# Patient Record
Sex: Male | Born: 1935 | Race: White | Hispanic: No | Smoking: Never smoker
Health system: Southern US, Community
[De-identification: ages and names within clinical notes are randomized; demographics above are authoritative.]

## PROBLEM LIST (undated history)

## (undated) DIAGNOSIS — R0600 Dyspnea, unspecified: Secondary | ICD-10-CM

## (undated) DIAGNOSIS — E291 Testicular hypofunction: Secondary | ICD-10-CM

## (undated) DIAGNOSIS — I219 Acute myocardial infarction, unspecified: Secondary | ICD-10-CM

## (undated) DIAGNOSIS — M1990 Unspecified osteoarthritis, unspecified site: Secondary | ICD-10-CM

## (undated) DIAGNOSIS — IMO0002 Reserved for concepts with insufficient information to code with codable children: Secondary | ICD-10-CM

## (undated) DIAGNOSIS — R7989 Other specified abnormal findings of blood chemistry: Secondary | ICD-10-CM

## (undated) DIAGNOSIS — D649 Anemia, unspecified: Secondary | ICD-10-CM

## (undated) DIAGNOSIS — M545 Low back pain, unspecified: Secondary | ICD-10-CM

## (undated) DIAGNOSIS — M4800 Spinal stenosis, site unspecified: Secondary | ICD-10-CM

## (undated) DIAGNOSIS — I2510 Atherosclerotic heart disease of native coronary artery without angina pectoris: Secondary | ICD-10-CM

## (undated) DIAGNOSIS — N400 Enlarged prostate without lower urinary tract symptoms: Secondary | ICD-10-CM

## (undated) DIAGNOSIS — I10 Essential (primary) hypertension: Secondary | ICD-10-CM

## (undated) DIAGNOSIS — K8020 Calculus of gallbladder without cholecystitis without obstruction: Secondary | ICD-10-CM

## (undated) DIAGNOSIS — E785 Hyperlipidemia, unspecified: Secondary | ICD-10-CM

## (undated) HISTORY — DX: Anemia, unspecified: D64.9

## (undated) HISTORY — DX: Atherosclerotic heart disease of native coronary artery without angina pectoris: I25.10

## (undated) HISTORY — DX: Acute myocardial infarction, unspecified: I21.9

## (undated) HISTORY — DX: Reserved for concepts with insufficient information to code with codable children: IMO0002

## (undated) HISTORY — DX: Low back pain: M54.5

## (undated) HISTORY — DX: Dyspnea, unspecified: R06.00

## (undated) HISTORY — DX: Calculus of gallbladder without cholecystitis without obstruction: K80.20

## (undated) HISTORY — DX: Low back pain, unspecified: M54.50

## (undated) HISTORY — DX: Other specified abnormal findings of blood chemistry: R79.89

## (undated) HISTORY — DX: Benign prostatic hyperplasia without lower urinary tract symptoms: N40.0

## (undated) HISTORY — DX: Hyperlipidemia, unspecified: E78.5

## (undated) HISTORY — DX: Essential (primary) hypertension: I10

## (undated) HISTORY — DX: Testicular hypofunction: E29.1

## (undated) HISTORY — DX: Spinal stenosis, site unspecified: M48.00

## (undated) HISTORY — PX: TOTAL HIP ARTHROPLASTY: SHX124

## (undated) HISTORY — DX: Unspecified osteoarthritis, unspecified site: M19.90

---

## 1963-11-13 HISTORY — PX: APPENDECTOMY: SHX54

## 2000-03-26 ENCOUNTER — Encounter: Payer: Self-pay | Admitting: Urology

## 2000-03-28 ENCOUNTER — Observation Stay (HOSPITAL_COMMUNITY): Admission: RE | Admit: 2000-03-28 | Discharge: 2000-03-29 | Payer: Self-pay | Admitting: General Surgery

## 2000-06-06 ENCOUNTER — Encounter (INDEPENDENT_AMBULATORY_CARE_PROVIDER_SITE_OTHER): Payer: Self-pay | Admitting: Specialist

## 2000-06-06 ENCOUNTER — Observation Stay (HOSPITAL_COMMUNITY): Admission: RE | Admit: 2000-06-06 | Discharge: 2000-06-07 | Payer: Self-pay | Admitting: Urology

## 2000-06-24 ENCOUNTER — Encounter: Payer: Self-pay | Admitting: Emergency Medicine

## 2000-06-24 ENCOUNTER — Encounter: Payer: Self-pay | Admitting: Urology

## 2000-06-24 ENCOUNTER — Encounter: Payer: Self-pay | Admitting: General Surgery

## 2000-06-24 ENCOUNTER — Inpatient Hospital Stay (HOSPITAL_COMMUNITY): Admission: EM | Admit: 2000-06-24 | Discharge: 2000-07-09 | Payer: Self-pay

## 2000-06-26 ENCOUNTER — Encounter: Payer: Self-pay | Admitting: General Surgery

## 2000-06-27 ENCOUNTER — Encounter: Payer: Self-pay | Admitting: General Surgery

## 2000-07-02 ENCOUNTER — Encounter: Payer: Self-pay | Admitting: General Surgery

## 2003-07-06 ENCOUNTER — Emergency Department (HOSPITAL_COMMUNITY): Admission: EM | Admit: 2003-07-06 | Discharge: 2003-07-07 | Payer: Self-pay

## 2003-07-08 ENCOUNTER — Encounter: Payer: Self-pay | Admitting: Orthopedic Surgery

## 2003-07-09 ENCOUNTER — Inpatient Hospital Stay (HOSPITAL_COMMUNITY): Admission: RE | Admit: 2003-07-09 | Discharge: 2003-07-13 | Payer: Self-pay | Admitting: Orthopedic Surgery

## 2003-08-27 ENCOUNTER — Ambulatory Visit (HOSPITAL_COMMUNITY): Admission: RE | Admit: 2003-08-27 | Discharge: 2003-08-27 | Payer: Self-pay | Admitting: Orthopedic Surgery

## 2003-11-13 HISTORY — PX: TOTAL KNEE ARTHROPLASTY: SHX125

## 2005-11-12 HISTORY — PX: TOTAL KNEE ARTHROPLASTY: SHX125

## 2005-11-16 ENCOUNTER — Ambulatory Visit: Payer: Self-pay | Admitting: Internal Medicine

## 2005-11-28 ENCOUNTER — Ambulatory Visit: Payer: Self-pay | Admitting: Internal Medicine

## 2005-12-24 ENCOUNTER — Ambulatory Visit: Payer: Self-pay | Admitting: Internal Medicine

## 2005-12-27 ENCOUNTER — Ambulatory Visit: Payer: Self-pay | Admitting: Cardiology

## 2006-01-29 ENCOUNTER — Ambulatory Visit: Payer: Self-pay | Admitting: Internal Medicine

## 2006-02-08 ENCOUNTER — Ambulatory Visit: Payer: Self-pay | Admitting: Internal Medicine

## 2006-05-09 ENCOUNTER — Ambulatory Visit: Payer: Self-pay | Admitting: Internal Medicine

## 2006-05-16 ENCOUNTER — Ambulatory Visit: Payer: Self-pay | Admitting: Cardiology

## 2007-02-20 ENCOUNTER — Ambulatory Visit: Payer: Self-pay | Admitting: Internal Medicine

## 2008-02-18 DIAGNOSIS — J984 Other disorders of lung: Secondary | ICD-10-CM | POA: Insufficient documentation

## 2008-02-18 DIAGNOSIS — I517 Cardiomegaly: Secondary | ICD-10-CM | POA: Insufficient documentation

## 2008-02-18 DIAGNOSIS — R0602 Shortness of breath: Secondary | ICD-10-CM | POA: Insufficient documentation

## 2008-02-26 ENCOUNTER — Encounter: Payer: Self-pay | Admitting: Internal Medicine

## 2011-03-30 NOTE — Op Note (Signed)
[facility]  Patient:    Andrew Frey, Andrew Frey                      MRN: [id number] Proc. Date: 03/28/00 Adm. Date:  [id number] Disc. Date: [id number] Attending:  Laqueta Jean CC:         Sigmund I. Patsi Sears, M.D.                           Operative Report  PROCEDURE:  Repair recurrent left inguinal hernia with mesh--following Dr. Hollie Beach left hydrocelectomy.  SURGEON:  Gita Kudo, M.D.  ANESTHESIA:  General endotracheal.  PREOPERATIVE DIAGNOSIS:  Recurrent left inguinal hernia, left hydrocele.  POSTOPERATIVE DIAGNOSIS:  Recurrent left inguinal hernia, left hydrocele.  CLINICAL SUMMARY:  A 77 year old male requires hydrocelectomy by Dr. Patsi Sears for a large scrotal hydrocele in the left side. He has had a previous left inguinal hernia repair as well as a right inguinal hernia repair. He has a recurrence on the left that is symptomatic.  OPERATIVE FINDINGS:  There is a large hydrocele and this is described by Dr. Patsi Sears in his operative note. There was a large recurrent inguinal hernia on the left. The anatomy was distorted because of his previous surgery. It was a large combined direct/indirect.  DESCRIPTION OF PROCEDURE:  Under satisfactory general endotracheal anesthesia, having received 1.0 gm Ancef preop, the patients abdomen, scrotum and genitalia were prepped and draped in standard fashion. A transverse incision made near his previous incision on the left and carried down to the cord contents. Because of scar, the usual anatomy was difficult. The cord and its contents were mobilized with a Penrose drain and at this point, Dr. Patsi Sears brought the scrotum up and did a hydrocelectomy and described this separately. After placing a drain in the scrotum, I repaired the hernia.  The cord contents were mobilized with a Penrose drain. The large hernia was freed all around to healthy fascia and then inverted. Finger dissection  made a plane under the floor of the canal for placement of mesh. A piece of mesh was fashioned into a cone shaped plug and placed point end down into the defect. Then it was anchored with 3 sutures placed 1 cm from the fascial rim through and through the mesh and then back out through the fascia. In this manner, the defect was well plugged. Then the defect was closed over this with interrupted #0 Prolene suture and the ends left long. The remainder of the mesh was then tailored into an oval with a slit to go around the cord structures. It was anchored at the internal ring with a previously placed suture and then tacked around the periphery to the abdominal wall fascia above--remnants of the internal oblique muscle--fascia. It was anchored inferiorly to the inguinal ligament and then the tails were sutured to each other and the fascia above and lateral to the cord. The wound was then lavaged with saline, infiltrated with Marcaine. Closure with interrupted 2-0 and 3-0 Vicryl in layers and the skin edges approximated with Steri-Strips. Sterile dressings applied and the patient to the recovery room from the operating room in good condition. DD: 03/28/00 TD:  04/02/00 Job: [id number]

## 2011-03-30 NOTE — Op Note (Signed)
[facility]  Patient:    Andrew Frey, Andrew Frey                      MRN: [id number] Adm. Date:  [id number] Disc. Date: [id number] Attending:  Laqueta Jean CC:         Gita Kudo, M.D.             Dr. Nadine Counts                           Operative Report  PREOPERATIVE DIAGNOSES:  Left inguinal hernia and large left hydrocele.  POSTOPERATIVE DIAGNOSES:  Left inguinal hernia and large left hydrocele.  PROCEDURE:  Left hydrocelectomy per Dr. Patsi Sears and left inguinal herniorrhaphy per Dr. Maryagnes Amos.  HISTORY OF PRESENT ILLNESS:  After preparation and monitoring and appropriate preanesthesia, the patient was brought to the operating room and placed on the operating table in the dorsal supine position.  The patient then underwent exploratory inguinal surgery per Dr. Maryagnes Amos, with identification of the patients spermatic cord and identification of the patients large recurrent hernia.  The testicle was then delivered into the wound with blunt dissection, with a large amount of scarring noted.  A rather large hydrocele was noted, with clear straw-colored fluid, measuring approximately 100-150 cc.  This was drained, and the hydrocele sac was excised and discarded.  A 3-0 Vicryl suture was then used to suture around the edges of the hydrocele sac so that it did not bleed.  The testicle was delivered in the wound, and a drain was placed in retrograde fashion in the scrotum, with a Penrose drain, and sutured with 3-0 Vicryl.  The patient then underwent left inguinal hernia repair per Dr. Maryagnes Amos. DD:  03/28/00 TD:  04/02/00 Job: [id number]
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
## 2011-03-30 NOTE — Op Note (Signed)
[facility]  Patient:    Andrew Frey, Andrew Frey                      MRN: [id number] Proc. Date: 06/06/00 Adm. Date:  [id number] Disc. Date: [id number] Attending:  Laqueta Jean                           Operative Report  PREOPERATIVE DIAGNOSIS:  Left scrotal hematoma and concomitant tick bite.  POSTOPERATIVE DIAGNOSIS:  Left scrotal hematoma and concomitant tick bite.  OPERATION PERFORMED:  Excision of tick and scrotal exploration, drains of hematoma.  SURGEON:  Dr. Patsi Sears.  ANESTHESIA:  General.  PREPARATION:  After appropriate preanesthesia, the patient was brought to the operating room, placed on the operating table in the dorsal supine position where general anesthesia was introduced. He remained in this position where the scrotum was prepped with Betadine solution and draped in the usual fashion. Inspection of the scrotum revealed that the patient had sustained a tick bite, and the tick was still attached. The tick was removed by excising around the head of the tick. This was sent to the laboratory for evaluation. A 6 cm left hemiscrotal incision was then made and subcutaneous tissue dissected with the electrosurgical unit. Wound hematoma was identified and this was drained. This was irrigated then with copious amounts of antibiotic irrigation and packed with iodoform gauze. A sterile dressing was applied. The patient was awakened and taken to the recovery room in good condition. The wound was injected with 0.5 Marcaine with epinephrine. DD:  06/06/00 TD:  06/08/00 Job: [id number]
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
## 2011-05-03 ENCOUNTER — Other Ambulatory Visit: Payer: Self-pay | Admitting: Orthopaedic Surgery

## 2011-05-03 DIAGNOSIS — M545 Low back pain, unspecified: Secondary | ICD-10-CM

## 2011-05-10 ENCOUNTER — Other Ambulatory Visit: Payer: Self-pay

## 2011-05-24 ENCOUNTER — Ambulatory Visit
Admission: RE | Admit: 2011-05-24 | Discharge: 2011-05-24 | Disposition: A | Discharge status: Home / Self Care | Payer: Medicare Other | Source: Ambulatory Visit | Attending: Orthopaedic Surgery | Admitting: Orthopaedic Surgery

## 2011-05-24 DIAGNOSIS — M545 Low back pain, unspecified: Secondary | ICD-10-CM

## 2011-11-13 HISTORY — PX: OTHER SURGICAL HISTORY: SHX169

## 2013-05-14 ENCOUNTER — Other Ambulatory Visit: Payer: Self-pay | Admitting: *Deleted

## 2013-05-14 ENCOUNTER — Institutional Professional Consult (permissible substitution): Payer: Medicare Other | Admitting: Cardiology

## 2013-05-14 ENCOUNTER — Encounter: Payer: Self-pay | Admitting: *Deleted

## 2014-01-25 ENCOUNTER — Telehealth: Payer: Self-pay | Admitting: Critical Care Medicine

## 2014-01-25 NOTE — Telephone Encounter (Signed)
Pt set in [name] on 03/02/14 @ 4:00 PM for SOB Consult.  Nothing further needed at this time.  Andrew FairyHolly D Pryor

## 2014-02-19 ENCOUNTER — Telehealth: Payer: Self-pay | Admitting: Critical Care Medicine

## 2014-02-19 NOTE — Telephone Encounter (Signed)
Con set w/ PW in [place] on 03/02/14 @ 4:00 PM for SOB.  Nothing further needed. Antionette FairyHolly D Pryor

## 2014-03-02 ENCOUNTER — Institutional Professional Consult (permissible substitution): Payer: Medicare Other | Admitting: Critical Care Medicine

## 2014-04-12 ENCOUNTER — Encounter: Payer: Self-pay | Admitting: Critical Care Medicine

## 2014-04-13 ENCOUNTER — Encounter: Payer: Self-pay | Admitting: Critical Care Medicine

## 2014-04-13 ENCOUNTER — Ambulatory Visit (INDEPENDENT_AMBULATORY_CARE_PROVIDER_SITE_OTHER): Payer: Self-pay | Admitting: Critical Care Medicine

## 2014-04-13 VITALS — BP 124/70 | HR 74 | Temp 98.4°F | Ht 67.0 in | Wt 175.0 lb

## 2014-04-13 DIAGNOSIS — R0609 Other forms of dyspnea: Secondary | ICD-10-CM

## 2014-04-13 DIAGNOSIS — R0989 Other specified symptoms and signs involving the circulatory and respiratory systems: Secondary | ICD-10-CM

## 2014-04-13 DIAGNOSIS — I2510 Atherosclerotic heart disease of native coronary artery without angina pectoris: Secondary | ICD-10-CM | POA: Insufficient documentation

## 2014-04-13 DIAGNOSIS — IMO0002 Reserved for concepts with insufficient information to code with codable children: Secondary | ICD-10-CM | POA: Insufficient documentation

## 2014-04-13 DIAGNOSIS — M545 Low back pain, unspecified: Secondary | ICD-10-CM | POA: Insufficient documentation

## 2014-04-13 DIAGNOSIS — E785 Hyperlipidemia, unspecified: Secondary | ICD-10-CM | POA: Insufficient documentation

## 2014-04-13 DIAGNOSIS — I10 Essential (primary) hypertension: Secondary | ICD-10-CM | POA: Insufficient documentation

## 2014-04-13 DIAGNOSIS — K8020 Calculus of gallbladder without cholecystitis without obstruction: Secondary | ICD-10-CM | POA: Insufficient documentation

## 2014-04-13 DIAGNOSIS — R0600 Dyspnea, unspecified: Secondary | ICD-10-CM

## 2014-04-13 DIAGNOSIS — M4800 Spinal stenosis, site unspecified: Secondary | ICD-10-CM | POA: Insufficient documentation

## 2014-04-13 DIAGNOSIS — N400 Enlarged prostate without lower urinary tract symptoms: Secondary | ICD-10-CM | POA: Insufficient documentation

## 2014-04-13 DIAGNOSIS — D649 Anemia, unspecified: Secondary | ICD-10-CM | POA: Insufficient documentation

## 2014-04-13 NOTE — Patient Instructions (Signed)
A chest xray will be obtained A breathing test will be obtained Return 6 weeks

## 2014-04-13 NOTE — Progress Notes (Signed)
Subjective:    Patient ID: Andrew Frey, male    DOB: 10/18/1922, 80 y.o.   MRN: [id number]  HPI Comments: Chronic dyspnea over years gradually worse.   Shortness of Breath This is a chronic problem. The current episode started more than 1 year ago. The problem occurs daily (exertional only, walking level ground). The problem has been gradually worsening. Associated symptoms include leg swelling and wheezing. Pertinent negatives include no abdominal pain, chest pain, fever, hemoptysis, leg pain, orthopnea, PND, sore throat or sputum production. The symptoms are aggravated by any activity and exercise. Treatments tried: spiriva did not help. His past medical history is significant for CAD. There is no history of asthma, bronchiolitis, chronic lung disease, COPD, DVT, a heart failure, PE, pneumonia or a recent surgery.   Past Medical History  Diagnosis Date  . HLD (hyperlipidemia)   . Osteoarthritis   . HTN (hypertension)   . BPH (benign prostatic hyperplasia)   . Spinal stenosis   . Low testosterone   . DDD (degenerative disc disease)   . DJD (degenerative joint disease)   . CAD (coronary artery disease)   . MI (myocardial infarction)   . Anemia, unspecified   . Calculus of gallbladder without mention of cholecystitis or obstruction   . Other testicular hypofunction   . Dyspnea   . Lower back pain      Family History  Problem Relation Age of Onset  . Cancer Father   . Hypertension Mother   . Other Mother     cerebral hemorrhage  . Heart attack      sibling  . Heart disease      sibling  . Hypertension      child  . Stroke Mother      History   Social History  . Marital Status: Widowed    Spouse Name: N/A    Number of Children: 2  . Years of Education: N/A   Occupational History  . retired     Naval architecttruck driver, Orthoptistagriculture    Social History Main Topics  . Smoking status: Never Smoker   . Smokeless tobacco: Never Used  . Alcohol Use: No  . Drug Use: No  .  Sexual Activity: Not on file   Other Topics Concern  . Not on file   Social History Narrative  . No narrative on file     No Known Allergies   Outpatient Prescriptions Prior to Visit  Medication Sig Dispense Refill  . aspirin 81 MG tablet Take 81 mg by mouth daily.      Marland Kitchen. atorvastatin (LIPITOR) 80 MG tablet Take 0.5 tablets by mouth daily.      . benazepril (LOTENSIN) 10 MG tablet Take 10 mg by mouth daily.      . Cholecalciferol (VITAMIN D) 2000 UNITS CAPS Take 1 capsule by mouth daily.      . clopidogrel (PLAVIX) 75 MG tablet Take 75 mg by mouth daily with breakfast.      . HYDROcodone-acetaminophen (NORCO) 10-325 MG per tablet Take 1 tablet by mouth as needed.      . lidocaine (LIDODERM) 5 % Place 1 patch onto the skin daily. Remove & Discard patch within 12 hours or as directed by MD      . oxyCODONE-acetaminophen (PERCOCET) 10-325 MG per tablet Take 1 tablet by mouth 4 (four) times daily as needed.       . pravastatin (PRAVACHOL) 40 MG tablet Take 40 mg by mouth daily.      .Marland Kitchen  terazosin (HYTRIN) 5 MG capsule Take 1 capsule by mouth every evening.      . venlafaxine XR (EFFEXOR-XR) 75 MG 24 hr capsule Take 1 capsule by mouth daily.      Marland Kitchen albuterol (PROAIR HFA) 108 (90 BASE) MCG/ACT inhaler Inhale 2 puffs into the lungs every 6 (six) hours as needed for wheezing or shortness of breath.      . tiotropium (SPIRIVA) 18 MCG inhalation capsule Place 18 mcg into inhaler and inhale daily.      . furosemide (LASIX) 20 MG tablet Take 20 mg by mouth daily.       No facility-administered medications prior to visit.       Review of Systems  Constitutional: Negative for fever.  HENT: Negative for sore throat and voice change.   Respiratory: Positive for shortness of breath and wheezing. Negative for cough, hemoptysis, sputum production, choking and chest tightness.   Cardiovascular: Positive for leg swelling. Negative for chest pain, orthopnea and PND.  Gastrointestinal: Negative.   Negative for abdominal pain.       Objective:   Physical Exam  Filed Vitals:   04/13/14 1657  BP: 124/70  Pulse: 74  Temp: 98.4 F (36.9 C)  TempSrc: Oral  Height: 5\' 7"  (1.702 m)  Weight: 175 lb (79.379 kg)  SpO2: 97%    Gen: Pleasant, well-nourished, in no distress,  normal affect  ENT: No lesions,  mouth clear,  oropharynx clear, no postnasal drip  Neck: No JVD, no TMG, no carotid bruits  Lungs: No use of accessory muscles, no dullness to percussion, clear without rales or rhonchi  Cardiovascular: RRR, heart sounds normal, no murmur or gallops, no peripheral edema  Abdomen: soft and NT, no HSM,  BS normal  Musculoskeletal: No deformities, no cyanosis or clubbing  Neuro: alert, non focal  Skin: Warm, no lesions or rashes  No results found.       Assessment & Plan:   DYSPNEA Dyspnea, unclear etiology, labeled as asthma in the past per dr young in 2008 Plan Repeat CXR and PFTs No med change for now    Updated Medication List Outpatient Encounter Prescriptions as of 04/13/2014  Medication Sig  . aspirin 81 MG tablet Take 81 mg by mouth daily.  Marland Kitchen atorvastatin (LIPITOR) 80 MG tablet Take 0.5 tablets by mouth daily.  . benazepril (LOTENSIN) 10 MG tablet Take 10 mg by mouth daily.  . Cholecalciferol (VITAMIN D) 2000 UNITS CAPS Take 1 capsule by mouth daily.  . clopidogrel (PLAVIX) 75 MG tablet Take 75 mg by mouth daily with breakfast.  . Cyanocobalamin (VITAMIN B-12 PO) Take by mouth daily.  Marland Kitchen HYDROcodone-acetaminophen (NORCO) 10-325 MG per tablet Take 1 tablet by mouth as needed.  . lidocaine (LIDODERM) 5 % Place 1 patch onto the skin daily. Remove & Discard patch within 12 hours or as directed by MD  . meloxicam (MOBIC) 7.5 MG tablet Take 2 tablets by mouth daily.  Marland Kitchen OVER THE COUNTER MEDICATION Mega red once daily  . oxyCODONE-acetaminophen (PERCOCET) 10-325 MG per tablet Take 1 tablet by mouth 4 (four) times daily as needed.   . pravastatin (PRAVACHOL) 40  MG tablet Take 40 mg by mouth daily.  . tamsulosin (FLOMAX) 0.4 MG CAPS capsule Take 0.4 mg by mouth daily.  Marland Kitchen terazosin (HYTRIN) 5 MG capsule Take 1 capsule by mouth every evening.  . traMADol (ULTRAM) 50 MG tablet as needed.  . venlafaxine XR (EFFEXOR-XR) 75 MG 24 hr capsule Take 1 capsule by  mouth daily.  Marland Kitchen albuterol (PROAIR HFA) 108 (90 BASE) MCG/ACT inhaler Inhale 2 puffs into the lungs every 6 (six) hours as needed for wheezing or shortness of breath.  . tiotropium (SPIRIVA) 18 MCG inhalation capsule Place 18 mcg into inhaler and inhale daily.  . [DISCONTINUED] furosemide (LASIX) 20 MG tablet Take 20 mg by mouth daily.

## 2014-04-14 NOTE — Assessment & Plan Note (Signed)
Dyspnea, unclear etiology, labeled as asthma in the past per dr young in 2008 Plan Repeat CXR and PFTs No med change for now

## 2014-04-20 LAB — PULMONARY FUNCTION TEST

## 2014-04-23 ENCOUNTER — Telehealth: Payer: Self-pay | Admitting: *Deleted

## 2014-04-23 DIAGNOSIS — R0602 Shortness of breath: Secondary | ICD-10-CM

## 2014-04-23 NOTE — Telephone Encounter (Signed)
I spoke with Marcelino DusterMichelle at [facility].   Was advised pt had PFTs done on 04/20/14. She will fax these to triage. I will have Dr. Delford FieldWright review next wk when in office. lmomtcb on home and cell #s to inform daughter.

## 2014-04-23 NOTE — Telephone Encounter (Signed)
Pt seen in [place] office on 04-13-14 and CXR was ordered. Per PW tell the pt that CXR is normal.  I spoke with pt daughter (hippa signed) and advised her of CXR results. She also asked about PFT that was to be scheduled. I advised I will need to call and check on this and will let her know.

## 2014-04-27 ENCOUNTER — Encounter: Payer: Self-pay | Admitting: Critical Care Medicine

## 2014-04-27 NOTE — Telephone Encounter (Signed)
PFT results received.  Dr. Delford FieldWright, pls advise.  Thank you.

## 2014-04-27 NOTE — Telephone Encounter (Signed)
Tell pt he has no obstruction on the lung function test.  He does have some reduction in his air sac ability to exchange oxygen.  We need him to have a CT Angio of chest with contrast, evaluate for blood clots

## 2014-04-28 NOTE — Telephone Encounter (Addendum)
lmomtcb on pt's home # and pt's daughter's #.   Order for CT Chest Angio placed to be done at [facility]. [facility] - please schedule CT Chest per order.  Thank you. Will also hold in my box to ensure pt/daughter is aware.

## 2014-04-28 NOTE — Telephone Encounter (Signed)
ATC pt's daughter's cell # - lmomtcb ATC # listed as pt's cell #/daughter's home # - mailbox was full and unable to leave msg. lmomtcb on pt's home #  Pt will need to be NPO 2 hours prior to CT scheduled at 1:00pm.

## 2014-04-28 NOTE — Telephone Encounter (Signed)
[facility] Chest scheduled at [facility] for Thurs 04/29/14 at 12:00. Pt will need labs drawn first then CT Chest. [facility] for pt and LMOAM mobile number of patient's daughter, [name] to call me back ASAP regarding this test. Andrew Frey J Cobb

## 2014-04-29 NOTE — Telephone Encounter (Signed)
Daughter, Lanelle BalRoxanna, returned my call and is aware of appointment tomorrow, instructions and prep. She will talk to pt tonight and hopefully he will go tomorrow to have this CTA Chest done. Daughter will call us back tomorrow and let us know if patient is willing to go. Rhonda J Cobb

## 2014-04-29 NOTE — Telephone Encounter (Signed)
Daughter returned call and stated that she could not get in touch with her dad. CTA Chest appointment has been R/S to Friday 04/30/14 at 3:[facility]. Pt to arrive at 2:30 to have labs drawn, then CTA Chest at 3:30. NPO 2 hours before. Check in at the outpatient center.  Called daughter back to give her the above appointment and phone went directly to voice mail. LMOAM of the above information and asked that if she had any questions, to contact me directly at [phone]. Rhonda J Cobb

## 2014-04-29 NOTE — Telephone Encounter (Signed)
Called, spoke with pt's daughter.  Informed her of below results and recs per Dr. Delford FieldWright and of CT appt date, time, location, and instructions..  She verbalized understanding. She is unsure if she will be able to contact pt to have scan done today. She will try to contact him and will call back and ask to speak with Bjorn LoserRhonda to let us know yes or no if pt can go today.  If pt cannot go today, she is aware Bjorn LoserRhonda will be able to have this rescheduled.  She is aware it would be best if pt can go today. Will await call back.

## 2014-04-29 NOTE — Telephone Encounter (Signed)
I have tried to reach patients daughter x 3 times today after initial call. Goes directly to voice mail. I have left 2 messages with the appointment information, instructions. Advised if she had any questions, to call me back. I will not be here tomorrow, so I will forward this message back to Gweneth Dimitrirystal Jones, RN, BSN to follow up with pt/daughter tomorrow to see if patient went for CTA Chest on 04/30/14 at 3:30. Pt to arrive at 2:30 for labs first. Ollen Grosshonda J Cobb

## 2014-05-03 NOTE — Telephone Encounter (Signed)
CT Chest results and lab results received.   Placed in PW's to do for look at.

## 2014-05-03 NOTE — Telephone Encounter (Signed)
Noted , I will review when I get to office today

## 2014-05-03 NOTE — Telephone Encounter (Signed)
msg closed in error.  Will send to PW.

## 2014-05-03 NOTE — Telephone Encounter (Signed)
Let pt know CT scan does NOT show blood clots.  He has airway obstruction on CT scan

## 2014-05-03 NOTE — Telephone Encounter (Signed)
lmomtcb for pt and pt's daughter.

## 2014-05-04 ENCOUNTER — Encounter: Payer: Self-pay | Admitting: Critical Care Medicine

## 2014-05-05 NOTE — Telephone Encounter (Signed)
lmomtcb on pt's home # and daughter's #.

## 2014-05-06 ENCOUNTER — Telehealth: Payer: Self-pay | Admitting: Critical Care Medicine

## 2014-05-06 NOTE — Telephone Encounter (Signed)
Let pt know CT scan does NOT show blood clots. He has airway obstruction on CT scan  LMTCB

## 2014-05-06 NOTE — Telephone Encounter (Signed)
lmomtcb for pt  lmomtcb on pt's daughters cell #

## 2014-05-07 NOTE — Telephone Encounter (Signed)
Pt aware of results.  See phone msg from 05/06/14.

## 2014-05-07 NOTE — Telephone Encounter (Signed)
Pt returned call - advised of CT results as stated by PW per 6.12.15 phone note:  Storm FriskPatrick E Wright, MD at 05/03/2014 3:13 PM     Let pt know CT scan does NOT show blood clots. He has airway obstruction on CT scan   Pt verbalized his understanding and denied any questions/concerns at this time.  Nothing further needed; will sign off.

## 2014-05-07 NOTE — Telephone Encounter (Signed)
lmomtcb x 2  

## 2014-05-12 ENCOUNTER — Encounter: Payer: Self-pay | Admitting: Critical Care Medicine

## 2014-05-25 ENCOUNTER — Encounter: Payer: Self-pay | Admitting: Critical Care Medicine

## 2014-05-25 ENCOUNTER — Ambulatory Visit (INDEPENDENT_AMBULATORY_CARE_PROVIDER_SITE_OTHER): Payer: Medicare Other | Admitting: Critical Care Medicine

## 2014-05-25 VITALS — BP 140/62 | HR 98 | Temp 98.8°F | Ht 67.0 in | Wt 172.0 lb

## 2014-05-25 DIAGNOSIS — R0602 Shortness of breath: Secondary | ICD-10-CM

## 2014-05-25 NOTE — Progress Notes (Signed)
Subjective:    Patient ID: Andrew Frey, male    DOB: 1922-11-06, 80 y.o.   MRN: [id number]  HPI Comments: Chronic dyspnea over years gradually worse.   05/25/2014 Chief Complaint  Patient presents with  . Follow-up    sob-same with exertion,cough-white,sticky,no wheezing,no cp or tightness   At last ov no med changes. PFTs and CXR and CT chest done PFTs 04/20/14:  FeV1 88% FVC 84% FeV1/FVC 82%  DLCO 59%    CXR: NAD  CT Chest 04/29/14: No PE, small airway disease with air trapping Did not feel spiriva helped, stopped  No inhalers ever helped.  Does not remember if pred was tried.     Review of Systems  HENT: Negative for voice change.   Respiratory: Negative for cough, choking and chest tightness.   Gastrointestinal: Negative.        Objective:   Physical Exam   Filed Vitals:   05/25/14 1459  BP: 140/62  Pulse: 98  Temp: 98.8 F (37.1 C)  TempSrc: Oral  Height: 5\' 7"  (1.702 m)  Weight: 172 lb (78.019 kg)  SpO2: 95%    Gen: Pleasant, well-nourished, in no distress,  normal affect  ENT: No lesions,  mouth clear,  oropharynx clear, no postnasal drip  Neck: No JVD, no TMG, no carotid bruits  Lungs: No use of accessory muscles, no dullness to percussion, clear without rales or rhonchi  Cardiovascular: RRR, heart sounds normal, no murmur or gallops, no peripheral edema  Abdomen: soft and NT, no HSM,  BS normal  Musculoskeletal: No deformities, no cyanosis or clubbing  Neuro: alert, non focal  Skin: Warm, no lesions or rashes  No results found.       Assessment & Plan:   DYSPNEA Dyspnea, no primary lung disease detected except mild small airway obstruction on pfts No PE.  No active cardiac issues No improvement with inhalers Plan D/c inhalers Return prn     Updated Medication List Outpatient Encounter Prescriptions as of 05/25/2014  Medication Sig  . aspirin 81 MG tablet Take 81 mg by mouth daily.  Marland Kitchen. atorvastatin (LIPITOR) 80 MG tablet  Take 0.5 tablets by mouth daily.  . benazepril (LOTENSIN) 10 MG tablet Take 10 mg by mouth daily.  . Cholecalciferol (VITAMIN D) 2000 UNITS CAPS Take 1 capsule by mouth daily.  . clopidogrel (PLAVIX) 75 MG tablet Take 75 mg by mouth daily with breakfast.  . Cyanocobalamin (VITAMIN B-12 PO) Take by mouth daily.  Marland Kitchen. HYDROcodone-acetaminophen (NORCO) 10-325 MG per tablet Take 1 tablet by mouth as needed.  . meloxicam (MOBIC) 7.5 MG tablet Take 2 tablets by mouth daily.  Marland Kitchen. OVER THE COUNTER MEDICATION Mega red once daily  . tamsulosin (FLOMAX) 0.4 MG CAPS capsule Take 0.4 mg by mouth daily.  Marland Kitchen. terazosin (HYTRIN) 5 MG capsule Take 1 capsule by mouth every evening.  . traMADol (ULTRAM) 50 MG tablet as needed.  . venlafaxine XR (EFFEXOR-XR) 75 MG 24 hr capsule Take 1 capsule by mouth daily.  Marland Kitchen. lidocaine (LIDODERM) 5 % Place 1 patch onto the skin daily. Remove & Discard patch within 12 hours or as directed by MD  . oxyCODONE-acetaminophen (PERCOCET) 10-325 MG per tablet Take 1 tablet by mouth 4 (four) times daily as needed.   . pravastatin (PRAVACHOL) 40 MG tablet Take 40 mg by mouth daily.  . [DISCONTINUED] albuterol (PROAIR HFA) 108 (90 BASE) MCG/ACT inhaler Inhale 2 puffs into the lungs every 6 (six) hours as needed for wheezing or  shortness of breath.  . [DISCONTINUED] tiotropium (SPIRIVA) 18 MCG inhalation capsule Place 18 mcg into inhaler and inhale daily.

## 2014-05-25 NOTE — Patient Instructions (Signed)
Stop all inhalers Return as needed

## 2014-05-27 NOTE — Assessment & Plan Note (Signed)
Dyspnea, no primary lung disease detected except mild small airway obstruction on pfts No PE.  No active cardiac issues No improvement with inhalers Plan D/c inhalers Return prn

## 2014-06-10 ENCOUNTER — Encounter: Payer: Self-pay | Admitting: Critical Care Medicine

## 2016-02-14 DIAGNOSIS — M5136 Other intervertebral disc degeneration, lumbar region: Secondary | ICD-10-CM | POA: Diagnosis not present

## 2016-02-14 DIAGNOSIS — M62830 Muscle spasm of back: Secondary | ICD-10-CM | POA: Diagnosis not present

## 2016-02-14 DIAGNOSIS — M5137 Other intervertebral disc degeneration, lumbosacral region: Secondary | ICD-10-CM | POA: Diagnosis not present

## 2016-02-14 DIAGNOSIS — M9903 Segmental and somatic dysfunction of lumbar region: Secondary | ICD-10-CM | POA: Diagnosis not present

## 2016-02-14 DIAGNOSIS — M9904 Segmental and somatic dysfunction of sacral region: Secondary | ICD-10-CM | POA: Diagnosis not present

## 2016-02-14 DIAGNOSIS — M5441 Lumbago with sciatica, right side: Secondary | ICD-10-CM | POA: Diagnosis not present

## 2016-02-15 DIAGNOSIS — M62830 Muscle spasm of back: Secondary | ICD-10-CM | POA: Diagnosis not present

## 2016-02-15 DIAGNOSIS — M5136 Other intervertebral disc degeneration, lumbar region: Secondary | ICD-10-CM | POA: Diagnosis not present

## 2016-02-15 DIAGNOSIS — M5441 Lumbago with sciatica, right side: Secondary | ICD-10-CM | POA: Diagnosis not present

## 2016-02-15 DIAGNOSIS — M9904 Segmental and somatic dysfunction of sacral region: Secondary | ICD-10-CM | POA: Diagnosis not present

## 2016-02-15 DIAGNOSIS — M5137 Other intervertebral disc degeneration, lumbosacral region: Secondary | ICD-10-CM | POA: Diagnosis not present

## 2016-02-15 DIAGNOSIS — M9903 Segmental and somatic dysfunction of lumbar region: Secondary | ICD-10-CM | POA: Diagnosis not present

## 2016-02-16 DIAGNOSIS — M9903 Segmental and somatic dysfunction of lumbar region: Secondary | ICD-10-CM | POA: Diagnosis not present

## 2016-02-16 DIAGNOSIS — M62830 Muscle spasm of back: Secondary | ICD-10-CM | POA: Diagnosis not present

## 2016-02-16 DIAGNOSIS — M5441 Lumbago with sciatica, right side: Secondary | ICD-10-CM | POA: Diagnosis not present

## 2016-02-16 DIAGNOSIS — M5136 Other intervertebral disc degeneration, lumbar region: Secondary | ICD-10-CM | POA: Diagnosis not present

## 2016-02-16 DIAGNOSIS — M9904 Segmental and somatic dysfunction of sacral region: Secondary | ICD-10-CM | POA: Diagnosis not present

## 2016-02-16 DIAGNOSIS — M5137 Other intervertebral disc degeneration, lumbosacral region: Secondary | ICD-10-CM | POA: Diagnosis not present

## 2016-02-21 DIAGNOSIS — M62830 Muscle spasm of back: Secondary | ICD-10-CM | POA: Diagnosis not present

## 2016-02-21 DIAGNOSIS — M9903 Segmental and somatic dysfunction of lumbar region: Secondary | ICD-10-CM | POA: Diagnosis not present

## 2016-02-21 DIAGNOSIS — M5136 Other intervertebral disc degeneration, lumbar region: Secondary | ICD-10-CM | POA: Diagnosis not present

## 2016-02-21 DIAGNOSIS — M5441 Lumbago with sciatica, right side: Secondary | ICD-10-CM | POA: Diagnosis not present

## 2016-02-21 DIAGNOSIS — M5137 Other intervertebral disc degeneration, lumbosacral region: Secondary | ICD-10-CM | POA: Diagnosis not present

## 2016-02-21 DIAGNOSIS — M9904 Segmental and somatic dysfunction of sacral region: Secondary | ICD-10-CM | POA: Diagnosis not present

## 2016-02-22 DIAGNOSIS — M9904 Segmental and somatic dysfunction of sacral region: Secondary | ICD-10-CM | POA: Diagnosis not present

## 2016-02-22 DIAGNOSIS — M9903 Segmental and somatic dysfunction of lumbar region: Secondary | ICD-10-CM | POA: Diagnosis not present

## 2016-02-22 DIAGNOSIS — M5137 Other intervertebral disc degeneration, lumbosacral region: Secondary | ICD-10-CM | POA: Diagnosis not present

## 2016-02-22 DIAGNOSIS — M5136 Other intervertebral disc degeneration, lumbar region: Secondary | ICD-10-CM | POA: Diagnosis not present

## 2016-02-22 DIAGNOSIS — M62830 Muscle spasm of back: Secondary | ICD-10-CM | POA: Diagnosis not present

## 2016-02-22 DIAGNOSIS — M5441 Lumbago with sciatica, right side: Secondary | ICD-10-CM | POA: Diagnosis not present

## 2016-02-23 DIAGNOSIS — M9904 Segmental and somatic dysfunction of sacral region: Secondary | ICD-10-CM | POA: Diagnosis not present

## 2016-02-23 DIAGNOSIS — M62830 Muscle spasm of back: Secondary | ICD-10-CM | POA: Diagnosis not present

## 2016-02-23 DIAGNOSIS — M9903 Segmental and somatic dysfunction of lumbar region: Secondary | ICD-10-CM | POA: Diagnosis not present

## 2016-02-23 DIAGNOSIS — M5137 Other intervertebral disc degeneration, lumbosacral region: Secondary | ICD-10-CM | POA: Diagnosis not present

## 2016-02-23 DIAGNOSIS — M5441 Lumbago with sciatica, right side: Secondary | ICD-10-CM | POA: Diagnosis not present

## 2016-02-23 DIAGNOSIS — M5136 Other intervertebral disc degeneration, lumbar region: Secondary | ICD-10-CM | POA: Diagnosis not present

## 2016-02-29 DIAGNOSIS — M5441 Lumbago with sciatica, right side: Secondary | ICD-10-CM | POA: Diagnosis not present

## 2016-02-29 DIAGNOSIS — M62830 Muscle spasm of back: Secondary | ICD-10-CM | POA: Diagnosis not present

## 2016-02-29 DIAGNOSIS — M5136 Other intervertebral disc degeneration, lumbar region: Secondary | ICD-10-CM | POA: Diagnosis not present

## 2016-02-29 DIAGNOSIS — M9903 Segmental and somatic dysfunction of lumbar region: Secondary | ICD-10-CM | POA: Diagnosis not present

## 2016-02-29 DIAGNOSIS — M9904 Segmental and somatic dysfunction of sacral region: Secondary | ICD-10-CM | POA: Diagnosis not present

## 2016-02-29 DIAGNOSIS — M5137 Other intervertebral disc degeneration, lumbosacral region: Secondary | ICD-10-CM | POA: Diagnosis not present

## 2016-03-01 DIAGNOSIS — M5441 Lumbago with sciatica, right side: Secondary | ICD-10-CM | POA: Diagnosis not present

## 2016-03-01 DIAGNOSIS — M9904 Segmental and somatic dysfunction of sacral region: Secondary | ICD-10-CM | POA: Diagnosis not present

## 2016-03-01 DIAGNOSIS — M5137 Other intervertebral disc degeneration, lumbosacral region: Secondary | ICD-10-CM | POA: Diagnosis not present

## 2016-03-01 DIAGNOSIS — M9903 Segmental and somatic dysfunction of lumbar region: Secondary | ICD-10-CM | POA: Diagnosis not present

## 2016-03-01 DIAGNOSIS — M5136 Other intervertebral disc degeneration, lumbar region: Secondary | ICD-10-CM | POA: Diagnosis not present

## 2016-03-01 DIAGNOSIS — M62830 Muscle spasm of back: Secondary | ICD-10-CM | POA: Diagnosis not present

## 2016-03-07 DIAGNOSIS — M9904 Segmental and somatic dysfunction of sacral region: Secondary | ICD-10-CM | POA: Diagnosis not present

## 2016-03-07 DIAGNOSIS — M9903 Segmental and somatic dysfunction of lumbar region: Secondary | ICD-10-CM | POA: Diagnosis not present

## 2016-03-07 DIAGNOSIS — M62830 Muscle spasm of back: Secondary | ICD-10-CM | POA: Diagnosis not present

## 2016-03-07 DIAGNOSIS — M5136 Other intervertebral disc degeneration, lumbar region: Secondary | ICD-10-CM | POA: Diagnosis not present

## 2016-03-07 DIAGNOSIS — M5441 Lumbago with sciatica, right side: Secondary | ICD-10-CM | POA: Diagnosis not present

## 2016-03-07 DIAGNOSIS — M5137 Other intervertebral disc degeneration, lumbosacral region: Secondary | ICD-10-CM | POA: Diagnosis not present

## 2016-03-08 DIAGNOSIS — M1611 Unilateral primary osteoarthritis, right hip: Secondary | ICD-10-CM | POA: Diagnosis not present

## 2016-03-08 DIAGNOSIS — T8484XA Pain due to internal orthopedic prosthetic devices, implants and grafts, initial encounter: Secondary | ICD-10-CM | POA: Diagnosis not present

## 2016-03-21 DIAGNOSIS — M62830 Muscle spasm of back: Secondary | ICD-10-CM | POA: Diagnosis not present

## 2016-03-21 DIAGNOSIS — M5441 Lumbago with sciatica, right side: Secondary | ICD-10-CM | POA: Diagnosis not present

## 2016-03-21 DIAGNOSIS — M9904 Segmental and somatic dysfunction of sacral region: Secondary | ICD-10-CM | POA: Diagnosis not present

## 2016-03-21 DIAGNOSIS — M9903 Segmental and somatic dysfunction of lumbar region: Secondary | ICD-10-CM | POA: Diagnosis not present

## 2016-03-21 DIAGNOSIS — M5137 Other intervertebral disc degeneration, lumbosacral region: Secondary | ICD-10-CM | POA: Diagnosis not present

## 2016-03-21 DIAGNOSIS — M5136 Other intervertebral disc degeneration, lumbar region: Secondary | ICD-10-CM | POA: Diagnosis not present

## 2016-03-27 ENCOUNTER — Encounter: Payer: Self-pay | Admitting: Cardiothoracic Surgery

## 2016-03-29 DIAGNOSIS — M1611 Unilateral primary osteoarthritis, right hip: Secondary | ICD-10-CM | POA: Diagnosis not present

## 2016-03-29 DIAGNOSIS — M25551 Pain in right hip: Secondary | ICD-10-CM | POA: Diagnosis not present

## 2016-04-17 ENCOUNTER — Encounter: Payer: Self-pay | Admitting: Cardiothoracic Surgery

## 2016-04-18 ENCOUNTER — Encounter: Payer: Self-pay | Admitting: Cardiothoracic Surgery

## 2016-04-20 DIAGNOSIS — Z471 Aftercare following joint replacement surgery: Secondary | ICD-10-CM | POA: Diagnosis not present

## 2016-04-20 DIAGNOSIS — M1611 Unilateral primary osteoarthritis, right hip: Secondary | ICD-10-CM | POA: Diagnosis not present

## 2016-04-20 DIAGNOSIS — M179 Osteoarthritis of knee, unspecified: Secondary | ICD-10-CM | POA: Diagnosis not present

## 2016-04-26 DIAGNOSIS — M25551 Pain in right hip: Secondary | ICD-10-CM | POA: Diagnosis not present

## 2016-04-26 DIAGNOSIS — I10 Essential (primary) hypertension: Secondary | ICD-10-CM | POA: Diagnosis not present

## 2016-04-26 DIAGNOSIS — E785 Hyperlipidemia, unspecified: Secondary | ICD-10-CM | POA: Diagnosis not present

## 2016-04-26 DIAGNOSIS — Z79899 Other long term (current) drug therapy: Secondary | ICD-10-CM | POA: Diagnosis not present

## 2016-04-26 DIAGNOSIS — I2510 Atherosclerotic heart disease of native coronary artery without angina pectoris: Secondary | ICD-10-CM | POA: Diagnosis not present

## 2016-05-02 ENCOUNTER — Encounter: Payer: Self-pay | Admitting: Cardiothoracic Surgery

## 2016-05-04 ENCOUNTER — Institutional Professional Consult (permissible substitution) (INDEPENDENT_AMBULATORY_CARE_PROVIDER_SITE_OTHER): Payer: Medicare Other | Admitting: Cardiothoracic Surgery

## 2016-05-04 ENCOUNTER — Encounter: Payer: Self-pay | Admitting: Cardiothoracic Surgery

## 2016-05-04 VITALS — BP 89/56 | HR 56 | Resp 20 | Ht 67.0 in | Wt 170.0 lb

## 2016-05-04 DIAGNOSIS — I712 Thoracic aortic aneurysm, without rupture: Secondary | ICD-10-CM | POA: Diagnosis not present

## 2016-05-04 DIAGNOSIS — I7121 Aneurysm of the ascending aorta, without rupture: Secondary | ICD-10-CM

## 2016-05-04 NOTE — Progress Notes (Signed)
[address]       [place] [id number]             [phone]                    Andrew LackWilliam L Frey [facility] Record #[id number] Date of Birth: [birth year]  Referring: Andrew Frey, Caroline, MD Primary Care: Andrew Frey,CAROLINE, MD Cardiology: Dr Andrew HissMunley  Ortho: Dr Andrew MiresGuevara   Chief Complaint:    Chief Complaint  Patient presents with  . Thoracic Aortic Aneurysm    Surgical eval, CT Chest 03/01/16    History of Present Illness:    Andrew Frey 82 y.o. male is seen in the office for  Dilated ascending aorta. The patient is a history of being hospitalized in early 2017 for pneumonic process. A follow-up CT scan was performed in April and the patient's primary care physician noted the finding of dilated ascending aorta and referred the patient to thoracic surgery for evaluation. The patient has had difficulty ambulating and is contemplated hip replacement surgery.    The patient and his daughter note that he has had progressive weight loss, increasing difficulty ambulating, shortness of breath with very little activity. Patient notes that  Occasionally he has chest discomfort for which he takes nitroglycerin with relief Of chest discomfort. Patient has a history of myocardial infarction in the past but did not know any of the  Details of this. He and his daughter were unaware that he had any history of dilated aorta.      Current Activity/ Functional Status:  Patient is not independent with mobility/ambulation, transfers, ADL's, IADL's.   Zubrod Score: At the time of surgery this patient's most appropriate activity status/level should be described as: []     0    Normal activity, no symptoms []     1    Restricted in physical strenuous activity but ambulatory, able to do out light work []     2    Ambulatory and capable of self care, unable to do work activities, up and about               >50 % of waking hours                              [x]     3    Only  limited self care, in bed greater than 50% of waking hours []     4    Completely disabled, no self care, confined to bed or chair []     5    Moribund   Past Medical History  Diagnosis Date  . HLD (hyperlipidemia)   . Osteoarthritis   . HTN (hypertension)   . BPH (benign prostatic hyperplasia)   . Spinal stenosis   . Low testosterone   . DDD (degenerative disc disease)   . DJD (degenerative joint disease)   . CAD (coronary artery disease)   . MI (myocardial infarction) (HCC)   . Anemia, unspecified   . Calculus of gallbladder without mention of cholecystitis or obstruction   . Other testicular hypofunction   . Dyspnea   . Lower back pain     Past Surgical History  Procedure Laterality Date  . Appendectomy  1965  . Total knee arthroplasty  2007    left  . Total knee arthroplasty  2005    right  . Total hip arthroplasty    .  Stent placed  2013    Family History  Problem Relation Age of Onset  . Cancer Father   . Hypertension Mother   . Other Mother     cerebral hemorrhage  . Heart attack      sibling  . Heart disease      sibling  . Hypertension      child  . Stroke Mother     Social History   Social History  . Marital Status: Widowed    Spouse Name: N/A  . Number of Children: 2  . Years of Education: N/A   Occupational History  . retired     Naval architecttruck driver, Orthoptistagriculture    Social History Main Topics  . Smoking status: Never Smoker   . Smokeless tobacco: Never Used  . Alcohol Use: No  . Drug Use: No  . Sexual Activity: Not on file   Other Topics Concern  . Not on file   Social History Narrative    History  Smoking status  . Never Smoker   Smokeless tobacco  . Never Used    History  Alcohol Use No     No Known Allergies  Current Outpatient Prescriptions  Medication Sig Dispense Refill  . aspirin 81 MG tablet Take 81 mg by mouth daily.    . carvedilol (COREG) 3.125 MG tablet Take 3.125 mg by mouth 2 (two) times daily with a meal.   3    . Cholecalciferol (VITAMIN D) 2000 UNITS CAPS Take 1 capsule by mouth daily.    . clopidogrel (PLAVIX) 75 MG tablet Take 75 mg by mouth daily with breakfast.    . Cyanocobalamin (VITAMIN B-12 PO) Take by mouth daily.    . furosemide (LASIX) 20 MG tablet Take 20 mg by mouth daily.   3  . isosorbide mononitrate (IMDUR) 30 MG 24 hr tablet Take 15 mg by mouth daily.   1  . lidocaine (LIDODERM) 5 % Place 1 patch onto the skin daily. Remove & Discard patch within 12 hours or as directed by MD    . meloxicam (MOBIC) 7.5 MG tablet Take 2 tablets by mouth daily.    Marland Kitchen. OVER THE COUNTER MEDICATION Mega red once daily    . oxyCODONE-acetaminophen (PERCOCET) 10-325 MG per tablet Take 1 tablet by mouth 4 (four) times daily as needed.     . pravastatin (PRAVACHOL) 40 MG tablet Take 40 mg by mouth daily.    . tamsulosin (FLOMAX) 0.4 MG CAPS capsule Take 0.4 mg by mouth daily.    . traZODone (DESYREL) 50 MG tablet Take 50 mg by mouth at bedtime.   0  . venlafaxine XR (EFFEXOR-XR) 75 MG 24 hr capsule Take 150 capsules by mouth daily with breakfast.      No current facility-administered medications for this visit.      Review of Systems:     Cardiac Review of Systems: Y or N  Chest Pain [   y ]  Resting SOB [  n ] Exertional SOB  Cove.Etienne[y  ]  Orthopnea [ y ]   Pedal Edema [ n  ]    Palpitations [ n] Syncope  [ n]   Presyncope [ y  ]  General Review of Systems: [Y] = yes [  ]=no Constitional: recent weight change Cove.Etienne[y  ];  Wt loss over the last 3 months [   ] anorexia Cove.Etienne[y  ]; fatigue [  ]; nausea [  ]; night sweats [  ]; fever [  ];  or chills [  ];          Dental: poor dentition[  ]; Last Dentist visit:   Eye : blurred vision [  ]; diplopia [   ]; vision changes [  ];  Amaurosis fugax[  ]; Resp: cough [ n ];  wheezing[  ];  hemoptysis[  ]; shortness of breath[y  ]; paroxysmal nocturnal dyspnea[y  ]; dyspnea on exertion[ y ]; or orthopnea[  ];  GI:  gallstones[  ], vomiting[  ];  dysphagia[  ]; melena[ n ];   hematochezia [ n ]; heartburn[  ];   Hx of  Colonoscopy[  ]; GU: kidney stones [  ]; hematuria[  ];   dysuria [  ];  nocturia[  ];  history of     obstruction [  ]; urinary frequency [  ]             Skin: rash, swelling[  ];, hair loss[  ];  peripheral edema[  ];  or itching[  ]; Musculosketetal: myalgias[ y ];  joint swelling[  ];  joint erythema[  ];  joint pain[y  ];  back pain[  ];  Heme/Lymph: bruising[  ];  bleeding[  ];  anemia[  ];  Neuro: TIA[ n ];  headaches[  ];  stroke[  n];  vertigo[  ];  seizures[  ];   paresthesias[  ];  difficulty walking[ y ];  Psych:depression[ y ]; anxiety[ y ];  Endocrine: diabetes[  ];  thyroid dysfunction[  ];  Immunizations: Flu up to date [  n]; Pneumococcal up to date [ n ];  Other:  Physical Exam: BP 89/56 mmHg  Pulse 56  Resp 20  Ht  (1.702 m)  Wt 170 lb (77.111 kg)  BMI 26.62 kg/m2  SpO2 93%  PHYSICAL EXAMINATION: General appearance: alert, cooperative, appears stated age and no distress Head: Normocephalic, without obvious abnormality, atraumatic Neck: no adenopathy, no carotid bruit, no JVD, supple, symmetrical, trachea midline and thyroid not enlarged, symmetric, no tenderness/mass/nodules Lymph nodes: Cervical, supraclavicular, and axillary nodes normal. Resp: clear to auscultation bilaterally Back: symmetric, no curvature. ROM normal. No CVA tenderness. Cardio: regular rate and rhythm, S1, S2 normal, no murmur Of aortic insufficiency, click, rub or gallop GI: soft, non-tender; bowel sounds normal; no masses,  no organomegaly Extremities: extremities normal, atraumatic, no cyanosis or edema and Homans sign is negative, no sign of DVT Neurologic: Grossly normal Patient appears frail, comes in office with wheel chair, uses walker for mobility , heard of hearing  Diagnostic Studies & Laboratory data:     Recent Radiology Findings: 2017 and 2007     I have independently reviewed the above radiology studies  and reviewed the  findings with the patient. I reviewed the patient's CT scans from early 2007 through April 2017.      Recent Lab Findings: No results found for: WBC, HGB, HCT, PLT, GLUCOSE, CHOL, TRIG, HDL, LDLDIRECT, LDLCALC, ALT, AST, NA, K, CL, CREATININE, BUN, CO2, TSH, INR, GLUF, HGBA1C  Aortic Size Index=    4.4      /Body surface area is 1.91 meters squared. = 2.3   < 2.75 cm/m2      4% risk per year 2.75 to 4.25          8% risk per year > 4.25 cm/m2    20% risk per year    Assessment / Plan:   Mild Dilation of Ascending Aorta, basically  unchanged for 10 years -  on review of the films it's apparent that the patient has had a dilated descending aorta since at least 2007 and very marginally changed over the past 10 years  Large hiatal hernia with intrathoracic stomach not present 10 years ago-    i discussed with the patient and his daughter signs and symptoms of acute aortic dissection. In general,  elective repair in a low surgical risk patient of the  ascending aorta  Is when it reaches a size of 5.5 cm. I do not think the patient's dilated aorta enters in to the decision about proceeding with hip replacement are not. I'm concerned about his degree of shortness of breath with minimal activity history of myocardial infarction in the past and intermittent anginal symptoms. His daughter notes that he is to see Dr. Dulce Sellar early next week.    I  spent 40 minutes counseling the patient face to face and 50% or more the  time was spent in counseling and coordination of care. The total time spent in the appointment was 60 minutes.  Delight Ovens MD      [address] [place] [place] [address] Office [phone]   Beeper [phone]  05/04/2016 3:52 PM

## 2016-05-08 DIAGNOSIS — I5032 Chronic diastolic (congestive) heart failure: Secondary | ICD-10-CM | POA: Diagnosis not present

## 2016-05-08 DIAGNOSIS — I712 Thoracic aortic aneurysm, without rupture: Secondary | ICD-10-CM | POA: Diagnosis not present

## 2016-05-08 DIAGNOSIS — I10 Essential (primary) hypertension: Secondary | ICD-10-CM | POA: Diagnosis not present

## 2016-05-08 DIAGNOSIS — Z01810 Encounter for preprocedural cardiovascular examination: Secondary | ICD-10-CM | POA: Diagnosis not present

## 2016-05-08 DIAGNOSIS — I2510 Atherosclerotic heart disease of native coronary artery without angina pectoris: Secondary | ICD-10-CM | POA: Diagnosis not present

## 2016-05-13 DIAGNOSIS — S72001D Fracture of unspecified part of neck of right femur, subsequent encounter for closed fracture with routine healing: Secondary | ICD-10-CM | POA: Diagnosis not present

## 2016-05-13 DIAGNOSIS — Z471 Aftercare following joint replacement surgery: Secondary | ICD-10-CM | POA: Diagnosis not present

## 2016-05-13 DIAGNOSIS — F329 Major depressive disorder, single episode, unspecified: Secondary | ICD-10-CM | POA: Diagnosis not present

## 2016-05-13 DIAGNOSIS — I110 Hypertensive heart disease with heart failure: Secondary | ICD-10-CM | POA: Diagnosis not present

## 2016-05-13 DIAGNOSIS — S72091A Other fracture of head and neck of right femur, initial encounter for closed fracture: Secondary | ICD-10-CM | POA: Diagnosis not present

## 2016-05-13 DIAGNOSIS — I5032 Chronic diastolic (congestive) heart failure: Secondary | ICD-10-CM | POA: Diagnosis not present

## 2016-05-13 DIAGNOSIS — S72009A Fracture of unspecified part of neck of unspecified femur, initial encounter for closed fracture: Secondary | ICD-10-CM | POA: Diagnosis not present

## 2016-05-13 DIAGNOSIS — Z96641 Presence of right artificial hip joint: Secondary | ICD-10-CM | POA: Diagnosis not present

## 2016-05-13 DIAGNOSIS — S72001A Fracture of unspecified part of neck of right femur, initial encounter for closed fracture: Secondary | ICD-10-CM | POA: Diagnosis not present

## 2016-05-13 DIAGNOSIS — G4700 Insomnia, unspecified: Secondary | ICD-10-CM | POA: Diagnosis not present

## 2016-05-13 DIAGNOSIS — I10 Essential (primary) hypertension: Secondary | ICD-10-CM | POA: Diagnosis not present

## 2016-05-13 DIAGNOSIS — M8580 Other specified disorders of bone density and structure, unspecified site: Secondary | ICD-10-CM | POA: Diagnosis not present

## 2016-05-13 DIAGNOSIS — R278 Other lack of coordination: Secondary | ICD-10-CM | POA: Diagnosis not present

## 2016-05-13 DIAGNOSIS — J45909 Unspecified asthma, uncomplicated: Secondary | ICD-10-CM | POA: Diagnosis not present

## 2016-05-13 DIAGNOSIS — I252 Old myocardial infarction: Secondary | ICD-10-CM | POA: Diagnosis not present

## 2016-05-13 DIAGNOSIS — R0600 Dyspnea, unspecified: Secondary | ICD-10-CM | POA: Diagnosis not present

## 2016-05-13 DIAGNOSIS — M1611 Unilateral primary osteoarthritis, right hip: Secondary | ICD-10-CM | POA: Diagnosis not present

## 2016-05-13 DIAGNOSIS — R4182 Altered mental status, unspecified: Secondary | ICD-10-CM | POA: Diagnosis not present

## 2016-05-13 DIAGNOSIS — R0602 Shortness of breath: Secondary | ICD-10-CM | POA: Diagnosis not present

## 2016-05-13 DIAGNOSIS — D649 Anemia, unspecified: Secondary | ICD-10-CM | POA: Diagnosis not present

## 2016-05-13 DIAGNOSIS — Z955 Presence of coronary angioplasty implant and graft: Secondary | ICD-10-CM | POA: Diagnosis not present

## 2016-05-13 DIAGNOSIS — I2510 Atherosclerotic heart disease of native coronary artery without angina pectoris: Secondary | ICD-10-CM | POA: Diagnosis not present

## 2016-05-13 DIAGNOSIS — Z7982 Long term (current) use of aspirin: Secondary | ICD-10-CM | POA: Diagnosis not present

## 2016-05-13 DIAGNOSIS — N400 Enlarged prostate without lower urinary tract symptoms: Secondary | ICD-10-CM | POA: Diagnosis not present

## 2016-05-14 DIAGNOSIS — S72091A Other fracture of head and neck of right femur, initial encounter for closed fracture: Secondary | ICD-10-CM | POA: Diagnosis not present

## 2016-05-14 DIAGNOSIS — S72009A Fracture of unspecified part of neck of unspecified femur, initial encounter for closed fracture: Secondary | ICD-10-CM | POA: Diagnosis not present

## 2016-05-15 DIAGNOSIS — S72091A Other fracture of head and neck of right femur, initial encounter for closed fracture: Secondary | ICD-10-CM | POA: Diagnosis not present

## 2016-05-15 DIAGNOSIS — I10 Essential (primary) hypertension: Secondary | ICD-10-CM | POA: Diagnosis not present

## 2016-05-15 DIAGNOSIS — D649 Anemia, unspecified: Secondary | ICD-10-CM | POA: Diagnosis not present

## 2016-05-15 DIAGNOSIS — I5032 Chronic diastolic (congestive) heart failure: Secondary | ICD-10-CM | POA: Diagnosis not present

## 2016-05-16 DIAGNOSIS — S72091A Other fracture of head and neck of right femur, initial encounter for closed fracture: Secondary | ICD-10-CM | POA: Diagnosis not present

## 2016-05-16 DIAGNOSIS — Z471 Aftercare following joint replacement surgery: Secondary | ICD-10-CM | POA: Diagnosis not present

## 2016-05-16 DIAGNOSIS — M8580 Other specified disorders of bone density and structure, unspecified site: Secondary | ICD-10-CM | POA: Diagnosis not present

## 2016-05-16 DIAGNOSIS — M1611 Unilateral primary osteoarthritis, right hip: Secondary | ICD-10-CM | POA: Diagnosis not present

## 2016-05-16 DIAGNOSIS — Z96641 Presence of right artificial hip joint: Secondary | ICD-10-CM | POA: Diagnosis not present

## 2016-05-18 DIAGNOSIS — Z96641 Presence of right artificial hip joint: Secondary | ICD-10-CM | POA: Diagnosis not present

## 2016-05-18 DIAGNOSIS — Z471 Aftercare following joint replacement surgery: Secondary | ICD-10-CM | POA: Diagnosis not present

## 2016-05-18 DIAGNOSIS — G4700 Insomnia, unspecified: Secondary | ICD-10-CM | POA: Diagnosis not present

## 2016-05-18 DIAGNOSIS — M1991 Primary osteoarthritis, unspecified site: Secondary | ICD-10-CM | POA: Diagnosis not present

## 2016-05-18 DIAGNOSIS — R278 Other lack of coordination: Secondary | ICD-10-CM | POA: Diagnosis not present

## 2016-05-18 DIAGNOSIS — F329 Major depressive disorder, single episode, unspecified: Secondary | ICD-10-CM | POA: Diagnosis not present

## 2016-05-18 DIAGNOSIS — I10 Essential (primary) hypertension: Secondary | ICD-10-CM | POA: Diagnosis not present

## 2016-05-18 DIAGNOSIS — S72001D Fracture of unspecified part of neck of right femur, subsequent encounter for closed fracture with routine healing: Secondary | ICD-10-CM | POA: Diagnosis not present

## 2016-06-11 DIAGNOSIS — M1991 Primary osteoarthritis, unspecified site: Secondary | ICD-10-CM | POA: Diagnosis not present

## 2016-06-11 DIAGNOSIS — Z471 Aftercare following joint replacement surgery: Secondary | ICD-10-CM | POA: Diagnosis not present

## 2016-06-20 DIAGNOSIS — Z79891 Long term (current) use of opiate analgesic: Secondary | ICD-10-CM | POA: Diagnosis not present

## 2016-06-20 DIAGNOSIS — Z96641 Presence of right artificial hip joint: Secondary | ICD-10-CM | POA: Diagnosis not present

## 2016-06-20 DIAGNOSIS — Z7982 Long term (current) use of aspirin: Secondary | ICD-10-CM | POA: Diagnosis not present

## 2016-06-20 DIAGNOSIS — I110 Hypertensive heart disease with heart failure: Secondary | ICD-10-CM | POA: Diagnosis not present

## 2016-06-20 DIAGNOSIS — I5032 Chronic diastolic (congestive) heart failure: Secondary | ICD-10-CM | POA: Diagnosis not present

## 2016-06-20 DIAGNOSIS — S72001D Fracture of unspecified part of neck of right femur, subsequent encounter for closed fracture with routine healing: Secondary | ICD-10-CM | POA: Diagnosis not present

## 2016-06-20 DIAGNOSIS — I2510 Atherosclerotic heart disease of native coronary artery without angina pectoris: Secondary | ICD-10-CM | POA: Diagnosis not present

## 2016-06-20 DIAGNOSIS — Z9181 History of falling: Secondary | ICD-10-CM | POA: Diagnosis not present

## 2016-08-05 DIAGNOSIS — Z23 Encounter for immunization: Secondary | ICD-10-CM | POA: Diagnosis not present

## 2016-08-13 DIAGNOSIS — Z79899 Other long term (current) drug therapy: Secondary | ICD-10-CM | POA: Diagnosis not present

## 2016-08-13 DIAGNOSIS — I10 Essential (primary) hypertension: Secondary | ICD-10-CM | POA: Diagnosis not present

## 2016-08-13 DIAGNOSIS — F5101 Primary insomnia: Secondary | ICD-10-CM | POA: Diagnosis not present

## 2016-08-13 DIAGNOSIS — R0602 Shortness of breath: Secondary | ICD-10-CM | POA: Diagnosis not present

## 2016-08-13 DIAGNOSIS — M1991 Primary osteoarthritis, unspecified site: Secondary | ICD-10-CM | POA: Diagnosis not present

## 2016-08-13 DIAGNOSIS — M48062 Spinal stenosis, lumbar region with neurogenic claudication: Secondary | ICD-10-CM | POA: Diagnosis not present

## 2016-08-13 DIAGNOSIS — I2510 Atherosclerotic heart disease of native coronary artery without angina pectoris: Secondary | ICD-10-CM | POA: Diagnosis not present

## 2016-08-13 DIAGNOSIS — M25571 Pain in right ankle and joints of right foot: Secondary | ICD-10-CM | POA: Diagnosis not present

## 2016-08-13 DIAGNOSIS — E785 Hyperlipidemia, unspecified: Secondary | ICD-10-CM | POA: Diagnosis not present

## 2016-09-06 DIAGNOSIS — I959 Hypotension, unspecified: Secondary | ICD-10-CM | POA: Diagnosis not present

## 2016-09-06 DIAGNOSIS — I10 Essential (primary) hypertension: Secondary | ICD-10-CM | POA: Diagnosis not present

## 2016-09-06 DIAGNOSIS — R2681 Unsteadiness on feet: Secondary | ICD-10-CM | POA: Diagnosis not present

## 2016-09-06 DIAGNOSIS — H903 Sensorineural hearing loss, bilateral: Secondary | ICD-10-CM | POA: Diagnosis not present

## 2016-09-06 DIAGNOSIS — I519 Heart disease, unspecified: Secondary | ICD-10-CM | POA: Diagnosis not present

## 2016-09-07 DIAGNOSIS — M48061 Spinal stenosis, lumbar region without neurogenic claudication: Secondary | ICD-10-CM | POA: Diagnosis not present

## 2016-09-07 DIAGNOSIS — R2681 Unsteadiness on feet: Secondary | ICD-10-CM | POA: Diagnosis not present

## 2016-09-07 DIAGNOSIS — M545 Low back pain: Secondary | ICD-10-CM | POA: Diagnosis not present

## 2016-09-07 DIAGNOSIS — R2689 Other abnormalities of gait and mobility: Secondary | ICD-10-CM | POA: Diagnosis not present

## 2016-09-07 DIAGNOSIS — R293 Abnormal posture: Secondary | ICD-10-CM | POA: Diagnosis not present

## 2016-09-07 DIAGNOSIS — M2560 Stiffness of unspecified joint, not elsewhere classified: Secondary | ICD-10-CM | POA: Diagnosis not present

## 2016-09-07 DIAGNOSIS — M6281 Muscle weakness (generalized): Secondary | ICD-10-CM | POA: Diagnosis not present

## 2016-09-11 DIAGNOSIS — M4800 Spinal stenosis, site unspecified: Secondary | ICD-10-CM | POA: Diagnosis not present

## 2016-09-11 DIAGNOSIS — I509 Heart failure, unspecified: Secondary | ICD-10-CM | POA: Diagnosis not present

## 2016-09-11 DIAGNOSIS — M1991 Primary osteoarthritis, unspecified site: Secondary | ICD-10-CM | POA: Diagnosis not present

## 2016-09-12 DIAGNOSIS — M2560 Stiffness of unspecified joint, not elsewhere classified: Secondary | ICD-10-CM | POA: Diagnosis not present

## 2016-09-12 DIAGNOSIS — R2689 Other abnormalities of gait and mobility: Secondary | ICD-10-CM | POA: Diagnosis not present

## 2016-09-12 DIAGNOSIS — M545 Low back pain: Secondary | ICD-10-CM | POA: Diagnosis not present

## 2016-09-12 DIAGNOSIS — M6281 Muscle weakness (generalized): Secondary | ICD-10-CM | POA: Diagnosis not present

## 2016-09-12 DIAGNOSIS — M48061 Spinal stenosis, lumbar region without neurogenic claudication: Secondary | ICD-10-CM | POA: Diagnosis not present

## 2016-09-12 DIAGNOSIS — R2681 Unsteadiness on feet: Secondary | ICD-10-CM | POA: Diagnosis not present

## 2016-09-12 DIAGNOSIS — R293 Abnormal posture: Secondary | ICD-10-CM | POA: Diagnosis not present

## 2016-09-21 DIAGNOSIS — M1991 Primary osteoarthritis, unspecified site: Secondary | ICD-10-CM | POA: Diagnosis not present

## 2016-09-21 DIAGNOSIS — Z471 Aftercare following joint replacement surgery: Secondary | ICD-10-CM | POA: Diagnosis not present

## 2016-09-26 DIAGNOSIS — R262 Difficulty in walking, not elsewhere classified: Secondary | ICD-10-CM | POA: Diagnosis not present

## 2016-09-26 DIAGNOSIS — I70203 Unspecified atherosclerosis of native arteries of extremities, bilateral legs: Secondary | ICD-10-CM | POA: Diagnosis not present

## 2016-09-26 DIAGNOSIS — B351 Tinea unguium: Secondary | ICD-10-CM | POA: Diagnosis not present

## 2016-09-26 DIAGNOSIS — M79674 Pain in right toe(s): Secondary | ICD-10-CM | POA: Diagnosis not present

## 2016-10-02 DIAGNOSIS — D649 Anemia, unspecified: Secondary | ICD-10-CM | POA: Diagnosis not present

## 2016-10-11 DIAGNOSIS — M4800 Spinal stenosis, site unspecified: Secondary | ICD-10-CM | POA: Diagnosis not present

## 2016-10-11 DIAGNOSIS — I509 Heart failure, unspecified: Secondary | ICD-10-CM | POA: Diagnosis not present

## 2016-10-11 DIAGNOSIS — M1991 Primary osteoarthritis, unspecified site: Secondary | ICD-10-CM | POA: Diagnosis not present

## 2016-10-16 DIAGNOSIS — M545 Low back pain: Secondary | ICD-10-CM | POA: Diagnosis not present

## 2016-10-16 DIAGNOSIS — R293 Abnormal posture: Secondary | ICD-10-CM | POA: Diagnosis not present

## 2016-10-16 DIAGNOSIS — M2560 Stiffness of unspecified joint, not elsewhere classified: Secondary | ICD-10-CM | POA: Diagnosis not present

## 2016-10-16 DIAGNOSIS — R2681 Unsteadiness on feet: Secondary | ICD-10-CM | POA: Diagnosis not present

## 2016-10-16 DIAGNOSIS — M6281 Muscle weakness (generalized): Secondary | ICD-10-CM | POA: Diagnosis not present

## 2016-10-16 DIAGNOSIS — M48061 Spinal stenosis, lumbar region without neurogenic claudication: Secondary | ICD-10-CM | POA: Diagnosis not present

## 2016-10-16 DIAGNOSIS — R2689 Other abnormalities of gait and mobility: Secondary | ICD-10-CM | POA: Diagnosis not present

## 2016-10-25 DIAGNOSIS — R0602 Shortness of breath: Secondary | ICD-10-CM | POA: Diagnosis not present

## 2016-11-11 DIAGNOSIS — M4800 Spinal stenosis, site unspecified: Secondary | ICD-10-CM | POA: Diagnosis not present

## 2016-11-11 DIAGNOSIS — M1991 Primary osteoarthritis, unspecified site: Secondary | ICD-10-CM | POA: Diagnosis not present

## 2016-11-11 DIAGNOSIS — I509 Heart failure, unspecified: Secondary | ICD-10-CM | POA: Diagnosis not present

## 2016-11-16 DIAGNOSIS — G4734 Idiopathic sleep related nonobstructive alveolar hypoventilation: Secondary | ICD-10-CM | POA: Diagnosis not present

## 2016-11-16 DIAGNOSIS — I10 Essential (primary) hypertension: Secondary | ICD-10-CM | POA: Diagnosis not present

## 2016-11-16 DIAGNOSIS — J180 Bronchopneumonia, unspecified organism: Secondary | ICD-10-CM | POA: Diagnosis not present

## 2016-11-16 DIAGNOSIS — F5101 Primary insomnia: Secondary | ICD-10-CM | POA: Diagnosis not present

## 2016-11-16 DIAGNOSIS — I2510 Atherosclerotic heart disease of native coronary artery without angina pectoris: Secondary | ICD-10-CM | POA: Diagnosis not present

## 2016-11-19 DIAGNOSIS — I509 Heart failure, unspecified: Secondary | ICD-10-CM | POA: Diagnosis not present

## 2016-11-22 DIAGNOSIS — Z209 Contact with and (suspected) exposure to unspecified communicable disease: Secondary | ICD-10-CM | POA: Diagnosis not present

## 2016-11-27 DIAGNOSIS — I517 Cardiomegaly: Secondary | ICD-10-CM | POA: Diagnosis not present

## 2016-11-27 DIAGNOSIS — K449 Diaphragmatic hernia without obstruction or gangrene: Secondary | ICD-10-CM | POA: Diagnosis not present

## 2016-11-27 DIAGNOSIS — N390 Urinary tract infection, site not specified: Secondary | ICD-10-CM | POA: Diagnosis not present

## 2016-11-27 DIAGNOSIS — R05 Cough: Secondary | ICD-10-CM | POA: Diagnosis not present

## 2016-12-12 DIAGNOSIS — M1991 Primary osteoarthritis, unspecified site: Secondary | ICD-10-CM | POA: Diagnosis not present

## 2016-12-12 DIAGNOSIS — M4800 Spinal stenosis, site unspecified: Secondary | ICD-10-CM | POA: Diagnosis not present

## 2016-12-12 DIAGNOSIS — I509 Heart failure, unspecified: Secondary | ICD-10-CM | POA: Diagnosis not present

## 2016-12-20 DIAGNOSIS — I509 Heart failure, unspecified: Secondary | ICD-10-CM | POA: Diagnosis not present

## 2016-12-25 DIAGNOSIS — H524 Presbyopia: Secondary | ICD-10-CM | POA: Diagnosis not present

## 2016-12-25 DIAGNOSIS — H04123 Dry eye syndrome of bilateral lacrimal glands: Secondary | ICD-10-CM | POA: Diagnosis not present

## 2017-01-09 DIAGNOSIS — M1991 Primary osteoarthritis, unspecified site: Secondary | ICD-10-CM | POA: Diagnosis not present

## 2017-01-09 DIAGNOSIS — M4800 Spinal stenosis, site unspecified: Secondary | ICD-10-CM | POA: Diagnosis not present

## 2017-01-09 DIAGNOSIS — I509 Heart failure, unspecified: Secondary | ICD-10-CM | POA: Diagnosis not present

## 2017-01-17 DIAGNOSIS — I509 Heart failure, unspecified: Secondary | ICD-10-CM | POA: Diagnosis not present

## 2017-01-17 DIAGNOSIS — C44519 Basal cell carcinoma of skin of other part of trunk: Secondary | ICD-10-CM | POA: Diagnosis not present

## 2017-01-21 DIAGNOSIS — Z79899 Other long term (current) drug therapy: Secondary | ICD-10-CM | POA: Diagnosis not present

## 2017-01-21 DIAGNOSIS — M1991 Primary osteoarthritis, unspecified site: Secondary | ICD-10-CM | POA: Diagnosis not present

## 2017-01-21 DIAGNOSIS — E785 Hyperlipidemia, unspecified: Secondary | ICD-10-CM | POA: Diagnosis not present

## 2017-01-21 DIAGNOSIS — D649 Anemia, unspecified: Secondary | ICD-10-CM | POA: Diagnosis not present

## 2017-01-21 DIAGNOSIS — I2510 Atherosclerotic heart disease of native coronary artery without angina pectoris: Secondary | ICD-10-CM | POA: Diagnosis not present

## 2017-02-09 DIAGNOSIS — W19XXXA Unspecified fall, initial encounter: Secondary | ICD-10-CM | POA: Diagnosis not present

## 2017-02-09 DIAGNOSIS — I509 Heart failure, unspecified: Secondary | ICD-10-CM | POA: Diagnosis not present

## 2017-02-09 DIAGNOSIS — M25552 Pain in left hip: Secondary | ICD-10-CM | POA: Diagnosis not present

## 2017-02-09 DIAGNOSIS — M1991 Primary osteoarthritis, unspecified site: Secondary | ICD-10-CM | POA: Diagnosis not present

## 2017-02-09 DIAGNOSIS — M4800 Spinal stenosis, site unspecified: Secondary | ICD-10-CM | POA: Diagnosis not present

## 2017-02-11 DIAGNOSIS — S76102S Unspecified injury of left quadriceps muscle, fascia and tendon, sequela: Secondary | ICD-10-CM | POA: Diagnosis not present

## 2017-02-11 DIAGNOSIS — M79605 Pain in left leg: Secondary | ICD-10-CM | POA: Diagnosis not present

## 2017-02-13 DIAGNOSIS — M6281 Muscle weakness (generalized): Secondary | ICD-10-CM | POA: Diagnosis not present

## 2017-02-13 DIAGNOSIS — M79605 Pain in left leg: Secondary | ICD-10-CM | POA: Diagnosis not present

## 2017-02-13 DIAGNOSIS — I2510 Atherosclerotic heart disease of native coronary artery without angina pectoris: Secondary | ICD-10-CM | POA: Diagnosis not present

## 2017-02-13 DIAGNOSIS — I110 Hypertensive heart disease with heart failure: Secondary | ICD-10-CM | POA: Diagnosis not present

## 2017-02-13 DIAGNOSIS — S76112D Strain of left quadriceps muscle, fascia and tendon, subsequent encounter: Secondary | ICD-10-CM | POA: Diagnosis not present

## 2017-02-13 DIAGNOSIS — Z9981 Dependence on supplemental oxygen: Secondary | ICD-10-CM | POA: Diagnosis not present

## 2017-02-13 DIAGNOSIS — R2689 Other abnormalities of gait and mobility: Secondary | ICD-10-CM | POA: Diagnosis not present

## 2017-02-13 DIAGNOSIS — I5032 Chronic diastolic (congestive) heart failure: Secondary | ICD-10-CM | POA: Diagnosis not present

## 2017-02-13 DIAGNOSIS — Z9181 History of falling: Secondary | ICD-10-CM | POA: Diagnosis not present

## 2017-02-13 DIAGNOSIS — Z7982 Long term (current) use of aspirin: Secondary | ICD-10-CM | POA: Diagnosis not present

## 2017-02-17 DIAGNOSIS — I509 Heart failure, unspecified: Secondary | ICD-10-CM | POA: Diagnosis not present

## 2017-03-05 DIAGNOSIS — C44519 Basal cell carcinoma of skin of other part of trunk: Secondary | ICD-10-CM | POA: Diagnosis not present

## 2017-03-11 DIAGNOSIS — M4800 Spinal stenosis, site unspecified: Secondary | ICD-10-CM | POA: Diagnosis not present

## 2017-03-11 DIAGNOSIS — I509 Heart failure, unspecified: Secondary | ICD-10-CM | POA: Diagnosis not present

## 2017-03-11 DIAGNOSIS — M1991 Primary osteoarthritis, unspecified site: Secondary | ICD-10-CM | POA: Diagnosis not present

## 2017-03-19 DIAGNOSIS — I509 Heart failure, unspecified: Secondary | ICD-10-CM | POA: Diagnosis not present

## 2017-04-07 DIAGNOSIS — J189 Pneumonia, unspecified organism: Secondary | ICD-10-CM | POA: Diagnosis not present

## 2017-04-07 DIAGNOSIS — R05 Cough: Secondary | ICD-10-CM | POA: Diagnosis not present

## 2017-04-07 DIAGNOSIS — R0602 Shortness of breath: Secondary | ICD-10-CM | POA: Diagnosis not present

## 2017-04-11 DIAGNOSIS — I509 Heart failure, unspecified: Secondary | ICD-10-CM | POA: Diagnosis not present

## 2017-04-11 DIAGNOSIS — M4800 Spinal stenosis, site unspecified: Secondary | ICD-10-CM | POA: Diagnosis not present

## 2017-04-11 DIAGNOSIS — M1991 Primary osteoarthritis, unspecified site: Secondary | ICD-10-CM | POA: Diagnosis not present

## 2017-04-17 DIAGNOSIS — W57XXXA Bitten or stung by nonvenomous insect and other nonvenomous arthropods, initial encounter: Secondary | ICD-10-CM | POA: Diagnosis not present

## 2017-04-17 DIAGNOSIS — I10 Essential (primary) hypertension: Secondary | ICD-10-CM | POA: Diagnosis not present

## 2017-04-17 DIAGNOSIS — S30861A Insect bite (nonvenomous) of abdominal wall, initial encounter: Secondary | ICD-10-CM | POA: Diagnosis not present

## 2017-04-17 DIAGNOSIS — R05 Cough: Secondary | ICD-10-CM | POA: Diagnosis not present

## 2017-04-19 DIAGNOSIS — I509 Heart failure, unspecified: Secondary | ICD-10-CM | POA: Diagnosis not present

## 2017-04-25 DIAGNOSIS — D649 Anemia, unspecified: Secondary | ICD-10-CM | POA: Diagnosis not present

## 2017-04-25 DIAGNOSIS — F330 Major depressive disorder, recurrent, mild: Secondary | ICD-10-CM | POA: Diagnosis not present

## 2017-04-25 DIAGNOSIS — I959 Hypotension, unspecified: Secondary | ICD-10-CM | POA: Diagnosis not present

## 2017-04-25 DIAGNOSIS — E785 Hyperlipidemia, unspecified: Secondary | ICD-10-CM | POA: Diagnosis not present

## 2017-04-25 DIAGNOSIS — Z79899 Other long term (current) drug therapy: Secondary | ICD-10-CM | POA: Diagnosis not present

## 2017-04-25 DIAGNOSIS — R05 Cough: Secondary | ICD-10-CM | POA: Diagnosis not present

## 2017-05-11 DIAGNOSIS — M4800 Spinal stenosis, site unspecified: Secondary | ICD-10-CM | POA: Diagnosis not present

## 2017-05-11 DIAGNOSIS — M1991 Primary osteoarthritis, unspecified site: Secondary | ICD-10-CM | POA: Diagnosis not present

## 2017-05-11 DIAGNOSIS — I509 Heart failure, unspecified: Secondary | ICD-10-CM | POA: Diagnosis not present

## 2017-05-19 DIAGNOSIS — M1991 Primary osteoarthritis, unspecified site: Secondary | ICD-10-CM | POA: Diagnosis not present

## 2017-05-19 DIAGNOSIS — M4800 Spinal stenosis, site unspecified: Secondary | ICD-10-CM | POA: Diagnosis not present

## 2017-05-19 DIAGNOSIS — I509 Heart failure, unspecified: Secondary | ICD-10-CM | POA: Diagnosis not present

## 2017-06-06 DIAGNOSIS — I10 Essential (primary) hypertension: Secondary | ICD-10-CM | POA: Diagnosis not present

## 2017-06-06 DIAGNOSIS — E785 Hyperlipidemia, unspecified: Secondary | ICD-10-CM | POA: Diagnosis not present

## 2017-06-06 DIAGNOSIS — G4734 Idiopathic sleep related nonobstructive alveolar hypoventilation: Secondary | ICD-10-CM | POA: Diagnosis not present

## 2017-06-06 DIAGNOSIS — I2510 Atherosclerotic heart disease of native coronary artery without angina pectoris: Secondary | ICD-10-CM | POA: Diagnosis not present

## 2017-06-19 DIAGNOSIS — M1991 Primary osteoarthritis, unspecified site: Secondary | ICD-10-CM | POA: Diagnosis not present

## 2017-06-19 DIAGNOSIS — M4800 Spinal stenosis, site unspecified: Secondary | ICD-10-CM | POA: Diagnosis not present

## 2017-06-19 DIAGNOSIS — I509 Heart failure, unspecified: Secondary | ICD-10-CM | POA: Diagnosis not present

## 2017-07-20 DIAGNOSIS — I509 Heart failure, unspecified: Secondary | ICD-10-CM | POA: Diagnosis not present

## 2017-07-20 DIAGNOSIS — M4800 Spinal stenosis, site unspecified: Secondary | ICD-10-CM | POA: Diagnosis not present

## 2017-07-20 DIAGNOSIS — M1991 Primary osteoarthritis, unspecified site: Secondary | ICD-10-CM | POA: Diagnosis not present

## 2017-08-12 DIAGNOSIS — Z23 Encounter for immunization: Secondary | ICD-10-CM | POA: Diagnosis not present

## 2017-08-13 DIAGNOSIS — R0602 Shortness of breath: Secondary | ICD-10-CM | POA: Diagnosis not present

## 2017-08-13 DIAGNOSIS — R05 Cough: Secondary | ICD-10-CM | POA: Diagnosis not present

## 2017-08-19 DIAGNOSIS — I509 Heart failure, unspecified: Secondary | ICD-10-CM | POA: Diagnosis not present

## 2017-08-19 DIAGNOSIS — M4800 Spinal stenosis, site unspecified: Secondary | ICD-10-CM | POA: Diagnosis not present

## 2017-08-19 DIAGNOSIS — M1991 Primary osteoarthritis, unspecified site: Secondary | ICD-10-CM | POA: Diagnosis not present

## 2017-09-10 DIAGNOSIS — J180 Bronchopneumonia, unspecified organism: Secondary | ICD-10-CM | POA: Diagnosis not present

## 2017-09-10 DIAGNOSIS — Z79899 Other long term (current) drug therapy: Secondary | ICD-10-CM | POA: Diagnosis not present

## 2017-09-10 DIAGNOSIS — R062 Wheezing: Secondary | ICD-10-CM | POA: Diagnosis not present

## 2017-09-19 DIAGNOSIS — I509 Heart failure, unspecified: Secondary | ICD-10-CM | POA: Diagnosis not present

## 2017-09-29 DIAGNOSIS — S0990XA Unspecified injury of head, initial encounter: Secondary | ICD-10-CM | POA: Diagnosis not present

## 2017-09-29 DIAGNOSIS — S01111A Laceration without foreign body of right eyelid and periocular area, initial encounter: Secondary | ICD-10-CM | POA: Diagnosis not present

## 2017-09-29 DIAGNOSIS — S0450XA Injury of facial nerve, unspecified side, initial encounter: Secondary | ICD-10-CM | POA: Diagnosis not present

## 2017-09-29 DIAGNOSIS — S0181XA Laceration without foreign body of other part of head, initial encounter: Secondary | ICD-10-CM | POA: Diagnosis not present

## 2017-09-29 DIAGNOSIS — R51 Headache: Secondary | ICD-10-CM | POA: Diagnosis not present

## 2017-10-19 DIAGNOSIS — I509 Heart failure, unspecified: Secondary | ICD-10-CM | POA: Diagnosis not present

## 2017-11-16 DIAGNOSIS — J189 Pneumonia, unspecified organism: Secondary | ICD-10-CM | POA: Diagnosis not present

## 2017-11-19 DIAGNOSIS — I509 Heart failure, unspecified: Secondary | ICD-10-CM | POA: Diagnosis not present

## 2017-11-19 DIAGNOSIS — I10 Essential (primary) hypertension: Secondary | ICD-10-CM | POA: Diagnosis not present

## 2017-11-19 DIAGNOSIS — Z79899 Other long term (current) drug therapy: Secondary | ICD-10-CM | POA: Diagnosis not present

## 2017-11-19 DIAGNOSIS — R05 Cough: Secondary | ICD-10-CM | POA: Diagnosis not present

## 2017-11-19 DIAGNOSIS — E785 Hyperlipidemia, unspecified: Secondary | ICD-10-CM | POA: Diagnosis not present

## 2017-11-19 DIAGNOSIS — I712 Thoracic aortic aneurysm, without rupture: Secondary | ICD-10-CM | POA: Diagnosis not present

## 2017-11-28 DIAGNOSIS — J189 Pneumonia, unspecified organism: Secondary | ICD-10-CM | POA: Diagnosis not present

## 2017-11-29 DIAGNOSIS — R0602 Shortness of breath: Secondary | ICD-10-CM | POA: Diagnosis not present

## 2017-11-29 DIAGNOSIS — I517 Cardiomegaly: Secondary | ICD-10-CM | POA: Diagnosis not present

## 2017-12-18 DIAGNOSIS — R0602 Shortness of breath: Secondary | ICD-10-CM | POA: Diagnosis not present

## 2017-12-18 DIAGNOSIS — I2510 Atherosclerotic heart disease of native coronary artery without angina pectoris: Secondary | ICD-10-CM | POA: Diagnosis not present

## 2017-12-18 DIAGNOSIS — M48062 Spinal stenosis, lumbar region with neurogenic claudication: Secondary | ICD-10-CM | POA: Diagnosis not present

## 2017-12-18 DIAGNOSIS — I10 Essential (primary) hypertension: Secondary | ICD-10-CM | POA: Diagnosis not present

## 2017-12-20 DIAGNOSIS — I509 Heart failure, unspecified: Secondary | ICD-10-CM | POA: Diagnosis not present

## 2018-01-17 DIAGNOSIS — I509 Heart failure, unspecified: Secondary | ICD-10-CM | POA: Diagnosis not present

## 2018-02-17 DIAGNOSIS — I509 Heart failure, unspecified: Secondary | ICD-10-CM | POA: Diagnosis not present

## 2018-03-12 DIAGNOSIS — R600 Localized edema: Secondary | ICD-10-CM | POA: Diagnosis not present

## 2018-03-12 DIAGNOSIS — R062 Wheezing: Secondary | ICD-10-CM | POA: Diagnosis not present

## 2018-03-12 DIAGNOSIS — R05 Cough: Secondary | ICD-10-CM | POA: Diagnosis not present

## 2018-03-19 DIAGNOSIS — I509 Heart failure, unspecified: Secondary | ICD-10-CM | POA: Diagnosis not present

## 2018-04-19 DIAGNOSIS — I509 Heart failure, unspecified: Secondary | ICD-10-CM | POA: Diagnosis not present

## 2018-05-19 DIAGNOSIS — I509 Heart failure, unspecified: Secondary | ICD-10-CM | POA: Diagnosis not present

## 2018-06-19 DIAGNOSIS — I509 Heart failure, unspecified: Secondary | ICD-10-CM | POA: Diagnosis not present

## 2018-07-20 DIAGNOSIS — I509 Heart failure, unspecified: Secondary | ICD-10-CM | POA: Diagnosis not present

## 2018-07-28 DIAGNOSIS — R451 Restlessness and agitation: Secondary | ICD-10-CM | POA: Diagnosis not present

## 2018-07-28 DIAGNOSIS — M48062 Spinal stenosis, lumbar region with neurogenic claudication: Secondary | ICD-10-CM | POA: Diagnosis not present

## 2018-07-28 DIAGNOSIS — Z6828 Body mass index (BMI) 28.0-28.9, adult: Secondary | ICD-10-CM | POA: Diagnosis not present

## 2018-08-19 DIAGNOSIS — I509 Heart failure, unspecified: Secondary | ICD-10-CM | POA: Diagnosis not present

## 2018-09-19 DIAGNOSIS — I509 Heart failure, unspecified: Secondary | ICD-10-CM | POA: Diagnosis not present

## 2018-09-22 DIAGNOSIS — I10 Essential (primary) hypertension: Secondary | ICD-10-CM | POA: Diagnosis not present

## 2018-09-22 DIAGNOSIS — Z0001 Encounter for general adult medical examination with abnormal findings: Secondary | ICD-10-CM | POA: Diagnosis not present

## 2018-09-22 DIAGNOSIS — Z1331 Encounter for screening for depression: Secondary | ICD-10-CM | POA: Diagnosis not present

## 2018-09-22 DIAGNOSIS — Z79899 Other long term (current) drug therapy: Secondary | ICD-10-CM | POA: Diagnosis not present

## 2018-09-22 DIAGNOSIS — Z6829 Body mass index (BMI) 29.0-29.9, adult: Secondary | ICD-10-CM | POA: Diagnosis not present

## 2018-09-22 DIAGNOSIS — D649 Anemia, unspecified: Secondary | ICD-10-CM | POA: Diagnosis not present

## 2018-09-22 DIAGNOSIS — E785 Hyperlipidemia, unspecified: Secondary | ICD-10-CM | POA: Diagnosis not present

## 2018-09-22 DIAGNOSIS — Z1339 Encounter for screening examination for other mental health and behavioral disorders: Secondary | ICD-10-CM | POA: Diagnosis not present

## 2018-10-19 DIAGNOSIS — I509 Heart failure, unspecified: Secondary | ICD-10-CM | POA: Diagnosis not present

## 2018-10-23 DIAGNOSIS — H35033 Hypertensive retinopathy, bilateral: Secondary | ICD-10-CM | POA: Diagnosis not present

## 2018-11-06 DIAGNOSIS — R451 Restlessness and agitation: Secondary | ICD-10-CM | POA: Diagnosis not present

## 2018-11-06 DIAGNOSIS — F419 Anxiety disorder, unspecified: Secondary | ICD-10-CM | POA: Diagnosis not present

## 2018-11-19 DIAGNOSIS — I509 Heart failure, unspecified: Secondary | ICD-10-CM | POA: Diagnosis not present

## 2018-11-27 DIAGNOSIS — D649 Anemia, unspecified: Secondary | ICD-10-CM | POA: Diagnosis not present

## 2018-11-27 DIAGNOSIS — R0689 Other abnormalities of breathing: Secondary | ICD-10-CM | POA: Diagnosis not present

## 2018-11-27 DIAGNOSIS — N400 Enlarged prostate without lower urinary tract symptoms: Secondary | ICD-10-CM | POA: Diagnosis not present

## 2018-11-27 DIAGNOSIS — J9621 Acute and chronic respiratory failure with hypoxia: Secondary | ICD-10-CM | POA: Diagnosis not present

## 2018-11-27 DIAGNOSIS — Z79899 Other long term (current) drug therapy: Secondary | ICD-10-CM | POA: Diagnosis not present

## 2018-11-27 DIAGNOSIS — I361 Nonrheumatic tricuspid (valve) insufficiency: Secondary | ICD-10-CM

## 2018-11-27 DIAGNOSIS — Z7902 Long term (current) use of antithrombotics/antiplatelets: Secondary | ICD-10-CM | POA: Diagnosis not present

## 2018-11-27 DIAGNOSIS — I5032 Chronic diastolic (congestive) heart failure: Secondary | ICD-10-CM | POA: Diagnosis not present

## 2018-11-27 DIAGNOSIS — I509 Heart failure, unspecified: Secondary | ICD-10-CM

## 2018-11-27 DIAGNOSIS — F329 Major depressive disorder, single episode, unspecified: Secondary | ICD-10-CM | POA: Diagnosis not present

## 2018-11-27 DIAGNOSIS — I2510 Atherosclerotic heart disease of native coronary artery without angina pectoris: Secondary | ICD-10-CM | POA: Diagnosis not present

## 2018-11-27 DIAGNOSIS — Z955 Presence of coronary angioplasty implant and graft: Secondary | ICD-10-CM | POA: Diagnosis not present

## 2018-11-27 DIAGNOSIS — J9601 Acute respiratory failure with hypoxia: Secondary | ICD-10-CM | POA: Diagnosis not present

## 2018-11-27 DIAGNOSIS — I252 Old myocardial infarction: Secondary | ICD-10-CM | POA: Diagnosis not present

## 2018-11-27 DIAGNOSIS — J440 Chronic obstructive pulmonary disease with acute lower respiratory infection: Secondary | ICD-10-CM | POA: Diagnosis not present

## 2018-11-27 DIAGNOSIS — J449 Chronic obstructive pulmonary disease, unspecified: Secondary | ICD-10-CM | POA: Diagnosis not present

## 2018-11-27 DIAGNOSIS — Z7982 Long term (current) use of aspirin: Secondary | ICD-10-CM | POA: Diagnosis not present

## 2018-11-27 DIAGNOSIS — I10 Essential (primary) hypertension: Secondary | ICD-10-CM | POA: Diagnosis not present

## 2018-11-27 DIAGNOSIS — J189 Pneumonia, unspecified organism: Secondary | ICD-10-CM | POA: Diagnosis not present

## 2018-11-27 DIAGNOSIS — R0902 Hypoxemia: Secondary | ICD-10-CM | POA: Diagnosis not present

## 2018-11-27 DIAGNOSIS — I110 Hypertensive heart disease with heart failure: Secondary | ICD-10-CM | POA: Diagnosis not present

## 2018-11-27 DIAGNOSIS — I351 Nonrheumatic aortic (valve) insufficiency: Secondary | ICD-10-CM

## 2018-11-27 DIAGNOSIS — N179 Acute kidney failure, unspecified: Secondary | ICD-10-CM | POA: Diagnosis not present

## 2018-11-27 DIAGNOSIS — I5033 Acute on chronic diastolic (congestive) heart failure: Secondary | ICD-10-CM | POA: Diagnosis not present

## 2018-11-27 DIAGNOSIS — R0602 Shortness of breath: Secondary | ICD-10-CM | POA: Diagnosis not present

## 2018-11-27 DIAGNOSIS — R05 Cough: Secondary | ICD-10-CM | POA: Diagnosis not present

## 2018-11-27 DIAGNOSIS — R52 Pain, unspecified: Secondary | ICD-10-CM | POA: Diagnosis not present

## 2018-11-27 DIAGNOSIS — Z2821 Immunization not carried out because of patient refusal: Secondary | ICD-10-CM | POA: Diagnosis not present

## 2018-11-28 DIAGNOSIS — N400 Enlarged prostate without lower urinary tract symptoms: Secondary | ICD-10-CM

## 2018-11-28 DIAGNOSIS — I5032 Chronic diastolic (congestive) heart failure: Secondary | ICD-10-CM

## 2018-11-28 DIAGNOSIS — J189 Pneumonia, unspecified organism: Secondary | ICD-10-CM | POA: Diagnosis not present

## 2018-11-28 DIAGNOSIS — J9601 Acute respiratory failure with hypoxia: Secondary | ICD-10-CM | POA: Diagnosis not present

## 2018-11-28 DIAGNOSIS — I10 Essential (primary) hypertension: Secondary | ICD-10-CM

## 2018-11-30 DIAGNOSIS — I491 Atrial premature depolarization: Secondary | ICD-10-CM | POA: Diagnosis not present

## 2018-11-30 DIAGNOSIS — R0600 Dyspnea, unspecified: Secondary | ICD-10-CM | POA: Diagnosis not present

## 2018-11-30 DIAGNOSIS — J189 Pneumonia, unspecified organism: Secondary | ICD-10-CM | POA: Diagnosis not present

## 2018-11-30 DIAGNOSIS — I110 Hypertensive heart disease with heart failure: Secondary | ICD-10-CM | POA: Diagnosis not present

## 2018-11-30 DIAGNOSIS — R0789 Other chest pain: Secondary | ICD-10-CM | POA: Diagnosis not present

## 2018-11-30 DIAGNOSIS — R079 Chest pain, unspecified: Secondary | ICD-10-CM | POA: Diagnosis not present

## 2018-11-30 DIAGNOSIS — J80 Acute respiratory distress syndrome: Secondary | ICD-10-CM | POA: Diagnosis not present

## 2018-11-30 DIAGNOSIS — I5032 Chronic diastolic (congestive) heart failure: Secondary | ICD-10-CM | POA: Diagnosis not present

## 2018-11-30 DIAGNOSIS — I2510 Atherosclerotic heart disease of native coronary artery without angina pectoris: Secondary | ICD-10-CM | POA: Diagnosis not present

## 2018-11-30 DIAGNOSIS — J440 Chronic obstructive pulmonary disease with acute lower respiratory infection: Secondary | ICD-10-CM | POA: Diagnosis not present

## 2018-11-30 DIAGNOSIS — I252 Old myocardial infarction: Secondary | ICD-10-CM | POA: Diagnosis not present

## 2018-11-30 DIAGNOSIS — R05 Cough: Secondary | ICD-10-CM | POA: Diagnosis not present

## 2018-12-05 DIAGNOSIS — R0602 Shortness of breath: Secondary | ICD-10-CM | POA: Diagnosis not present

## 2018-12-05 DIAGNOSIS — R062 Wheezing: Secondary | ICD-10-CM | POA: Diagnosis not present

## 2018-12-05 DIAGNOSIS — J189 Pneumonia, unspecified organism: Secondary | ICD-10-CM | POA: Diagnosis not present

## 2018-12-05 DIAGNOSIS — R0989 Other specified symptoms and signs involving the circulatory and respiratory systems: Secondary | ICD-10-CM | POA: Diagnosis not present

## 2018-12-05 DIAGNOSIS — R600 Localized edema: Secondary | ICD-10-CM | POA: Diagnosis not present

## 2018-12-08 DIAGNOSIS — J181 Lobar pneumonia, unspecified organism: Secondary | ICD-10-CM | POA: Diagnosis not present

## 2018-12-08 DIAGNOSIS — J9601 Acute respiratory failure with hypoxia: Secondary | ICD-10-CM | POA: Diagnosis not present

## 2018-12-08 DIAGNOSIS — N390 Urinary tract infection, site not specified: Secondary | ICD-10-CM | POA: Diagnosis not present

## 2018-12-08 DIAGNOSIS — R05 Cough: Secondary | ICD-10-CM | POA: Diagnosis not present

## 2018-12-08 DIAGNOSIS — R5381 Other malaise: Secondary | ICD-10-CM | POA: Diagnosis not present

## 2018-12-09 DIAGNOSIS — Z9181 History of falling: Secondary | ICD-10-CM | POA: Diagnosis not present

## 2018-12-09 DIAGNOSIS — I509 Heart failure, unspecified: Secondary | ICD-10-CM | POA: Diagnosis not present

## 2018-12-09 DIAGNOSIS — Z7982 Long term (current) use of aspirin: Secondary | ICD-10-CM | POA: Diagnosis not present

## 2018-12-09 DIAGNOSIS — Z9981 Dependence on supplemental oxygen: Secondary | ICD-10-CM | POA: Diagnosis not present

## 2018-12-09 DIAGNOSIS — E559 Vitamin D deficiency, unspecified: Secondary | ICD-10-CM | POA: Diagnosis not present

## 2018-12-09 DIAGNOSIS — E119 Type 2 diabetes mellitus without complications: Secondary | ICD-10-CM | POA: Diagnosis not present

## 2018-12-09 DIAGNOSIS — I110 Hypertensive heart disease with heart failure: Secondary | ICD-10-CM | POA: Diagnosis not present

## 2018-12-09 DIAGNOSIS — I10 Essential (primary) hypertension: Secondary | ICD-10-CM | POA: Diagnosis not present

## 2018-12-09 DIAGNOSIS — F0391 Unspecified dementia with behavioral disturbance: Secondary | ICD-10-CM | POA: Diagnosis not present

## 2018-12-09 DIAGNOSIS — E039 Hypothyroidism, unspecified: Secondary | ICD-10-CM | POA: Diagnosis not present

## 2018-12-09 DIAGNOSIS — F419 Anxiety disorder, unspecified: Secondary | ICD-10-CM | POA: Diagnosis not present

## 2018-12-09 DIAGNOSIS — M1991 Primary osteoarthritis, unspecified site: Secondary | ICD-10-CM | POA: Diagnosis not present

## 2018-12-09 DIAGNOSIS — J449 Chronic obstructive pulmonary disease, unspecified: Secondary | ICD-10-CM | POA: Diagnosis not present

## 2018-12-09 DIAGNOSIS — R451 Restlessness and agitation: Secondary | ICD-10-CM | POA: Diagnosis not present

## 2018-12-09 DIAGNOSIS — E785 Hyperlipidemia, unspecified: Secondary | ICD-10-CM | POA: Diagnosis not present

## 2018-12-09 DIAGNOSIS — D649 Anemia, unspecified: Secondary | ICD-10-CM | POA: Diagnosis not present

## 2018-12-09 DIAGNOSIS — J181 Lobar pneumonia, unspecified organism: Secondary | ICD-10-CM | POA: Diagnosis not present

## 2018-12-09 DIAGNOSIS — I5032 Chronic diastolic (congestive) heart failure: Secondary | ICD-10-CM | POA: Diagnosis not present

## 2018-12-09 DIAGNOSIS — D510 Vitamin B12 deficiency anemia due to intrinsic factor deficiency: Secondary | ICD-10-CM | POA: Diagnosis not present

## 2018-12-09 DIAGNOSIS — F39 Unspecified mood [affective] disorder: Secondary | ICD-10-CM | POA: Diagnosis not present

## 2018-12-09 DIAGNOSIS — N400 Enlarged prostate without lower urinary tract symptoms: Secondary | ICD-10-CM | POA: Diagnosis not present

## 2018-12-09 DIAGNOSIS — I2510 Atherosclerotic heart disease of native coronary artery without angina pectoris: Secondary | ICD-10-CM | POA: Diagnosis not present

## 2018-12-15 DIAGNOSIS — R451 Restlessness and agitation: Secondary | ICD-10-CM | POA: Diagnosis not present

## 2018-12-15 DIAGNOSIS — I5032 Chronic diastolic (congestive) heart failure: Secondary | ICD-10-CM | POA: Diagnosis not present

## 2018-12-15 DIAGNOSIS — F419 Anxiety disorder, unspecified: Secondary | ICD-10-CM | POA: Diagnosis not present

## 2018-12-15 DIAGNOSIS — R600 Localized edema: Secondary | ICD-10-CM | POA: Diagnosis not present

## 2018-12-20 DIAGNOSIS — I509 Heart failure, unspecified: Secondary | ICD-10-CM | POA: Diagnosis not present

## 2018-12-26 DIAGNOSIS — R54 Age-related physical debility: Secondary | ICD-10-CM | POA: Diagnosis not present

## 2018-12-26 DIAGNOSIS — R600 Localized edema: Secondary | ICD-10-CM | POA: Diagnosis not present

## 2018-12-26 DIAGNOSIS — I5032 Chronic diastolic (congestive) heart failure: Secondary | ICD-10-CM | POA: Diagnosis not present

## 2019-01-05 DIAGNOSIS — I10 Essential (primary) hypertension: Secondary | ICD-10-CM | POA: Diagnosis not present

## 2019-01-06 DIAGNOSIS — F39 Unspecified mood [affective] disorder: Secondary | ICD-10-CM | POA: Diagnosis not present

## 2019-01-06 DIAGNOSIS — F0391 Unspecified dementia with behavioral disturbance: Secondary | ICD-10-CM | POA: Diagnosis not present

## 2019-01-06 DIAGNOSIS — F419 Anxiety disorder, unspecified: Secondary | ICD-10-CM | POA: Diagnosis not present

## 2019-01-06 DIAGNOSIS — R451 Restlessness and agitation: Secondary | ICD-10-CM | POA: Diagnosis not present

## 2019-01-08 DIAGNOSIS — I2510 Atherosclerotic heart disease of native coronary artery without angina pectoris: Secondary | ICD-10-CM | POA: Diagnosis not present

## 2019-01-08 DIAGNOSIS — Z9181 History of falling: Secondary | ICD-10-CM | POA: Diagnosis not present

## 2019-01-08 DIAGNOSIS — N400 Enlarged prostate without lower urinary tract symptoms: Secondary | ICD-10-CM | POA: Diagnosis not present

## 2019-01-08 DIAGNOSIS — E039 Hypothyroidism, unspecified: Secondary | ICD-10-CM | POA: Diagnosis not present

## 2019-01-08 DIAGNOSIS — J449 Chronic obstructive pulmonary disease, unspecified: Secondary | ICD-10-CM | POA: Diagnosis not present

## 2019-01-08 DIAGNOSIS — D649 Anemia, unspecified: Secondary | ICD-10-CM | POA: Diagnosis not present

## 2019-01-08 DIAGNOSIS — Z7982 Long term (current) use of aspirin: Secondary | ICD-10-CM | POA: Diagnosis not present

## 2019-01-08 DIAGNOSIS — Z9981 Dependence on supplemental oxygen: Secondary | ICD-10-CM | POA: Diagnosis not present

## 2019-01-08 DIAGNOSIS — M1991 Primary osteoarthritis, unspecified site: Secondary | ICD-10-CM | POA: Diagnosis not present

## 2019-01-08 DIAGNOSIS — J181 Lobar pneumonia, unspecified organism: Secondary | ICD-10-CM | POA: Diagnosis not present

## 2019-01-08 DIAGNOSIS — I5032 Chronic diastolic (congestive) heart failure: Secondary | ICD-10-CM | POA: Diagnosis not present

## 2019-01-08 DIAGNOSIS — I110 Hypertensive heart disease with heart failure: Secondary | ICD-10-CM | POA: Diagnosis not present

## 2019-01-13 DIAGNOSIS — M6281 Muscle weakness (generalized): Secondary | ICD-10-CM | POA: Diagnosis not present

## 2019-01-13 DIAGNOSIS — R54 Age-related physical debility: Secondary | ICD-10-CM | POA: Diagnosis not present

## 2019-01-13 DIAGNOSIS — R634 Abnormal weight loss: Secondary | ICD-10-CM | POA: Diagnosis not present

## 2019-01-18 DIAGNOSIS — I509 Heart failure, unspecified: Secondary | ICD-10-CM | POA: Diagnosis not present

## 2019-02-03 DIAGNOSIS — R5381 Other malaise: Secondary | ICD-10-CM | POA: Diagnosis not present

## 2019-02-03 DIAGNOSIS — I5032 Chronic diastolic (congestive) heart failure: Secondary | ICD-10-CM | POA: Diagnosis not present

## 2019-02-03 DIAGNOSIS — R05 Cough: Secondary | ICD-10-CM | POA: Diagnosis not present

## 2019-02-03 DIAGNOSIS — R0989 Other specified symptoms and signs involving the circulatory and respiratory systems: Secondary | ICD-10-CM | POA: Diagnosis not present

## 2019-02-16 DIAGNOSIS — F0390 Unspecified dementia without behavioral disturbance: Secondary | ICD-10-CM | POA: Diagnosis not present

## 2019-02-16 DIAGNOSIS — R600 Localized edema: Secondary | ICD-10-CM | POA: Diagnosis not present

## 2019-02-16 DIAGNOSIS — R635 Abnormal weight gain: Secondary | ICD-10-CM | POA: Diagnosis not present

## 2019-02-16 DIAGNOSIS — I5032 Chronic diastolic (congestive) heart failure: Secondary | ICD-10-CM | POA: Diagnosis not present

## 2019-02-18 DIAGNOSIS — I509 Heart failure, unspecified: Secondary | ICD-10-CM | POA: Diagnosis not present

## 2019-03-02 DIAGNOSIS — J449 Chronic obstructive pulmonary disease, unspecified: Secondary | ICD-10-CM | POA: Diagnosis not present

## 2019-03-02 DIAGNOSIS — R05 Cough: Secondary | ICD-10-CM | POA: Diagnosis not present

## 2019-03-02 DIAGNOSIS — R600 Localized edema: Secondary | ICD-10-CM | POA: Diagnosis not present

## 2019-03-02 DIAGNOSIS — I5032 Chronic diastolic (congestive) heart failure: Secondary | ICD-10-CM | POA: Diagnosis not present

## 2019-03-04 DIAGNOSIS — K5900 Constipation, unspecified: Secondary | ICD-10-CM | POA: Diagnosis not present

## 2019-03-04 DIAGNOSIS — F0390 Unspecified dementia without behavioral disturbance: Secondary | ICD-10-CM | POA: Diagnosis not present

## 2019-03-05 DIAGNOSIS — K5900 Constipation, unspecified: Secondary | ICD-10-CM | POA: Diagnosis not present

## 2019-03-05 DIAGNOSIS — R54 Age-related physical debility: Secondary | ICD-10-CM | POA: Diagnosis not present

## 2019-03-06 DIAGNOSIS — F0390 Unspecified dementia without behavioral disturbance: Secondary | ICD-10-CM | POA: Diagnosis not present

## 2019-03-06 DIAGNOSIS — R4182 Altered mental status, unspecified: Secondary | ICD-10-CM | POA: Diagnosis not present

## 2019-03-06 DIAGNOSIS — R451 Restlessness and agitation: Secondary | ICD-10-CM | POA: Diagnosis not present

## 2019-03-06 DIAGNOSIS — F419 Anxiety disorder, unspecified: Secondary | ICD-10-CM | POA: Diagnosis not present

## 2019-03-09 DIAGNOSIS — N390 Urinary tract infection, site not specified: Secondary | ICD-10-CM | POA: Diagnosis not present

## 2019-03-10 DIAGNOSIS — F39 Unspecified mood [affective] disorder: Secondary | ICD-10-CM | POA: Diagnosis not present

## 2019-03-10 DIAGNOSIS — R451 Restlessness and agitation: Secondary | ICD-10-CM | POA: Diagnosis not present

## 2019-03-10 DIAGNOSIS — F0391 Unspecified dementia with behavioral disturbance: Secondary | ICD-10-CM | POA: Diagnosis not present

## 2019-03-10 DIAGNOSIS — F419 Anxiety disorder, unspecified: Secondary | ICD-10-CM | POA: Diagnosis not present

## 2019-03-18 DIAGNOSIS — E785 Hyperlipidemia, unspecified: Secondary | ICD-10-CM | POA: Diagnosis not present

## 2019-03-18 DIAGNOSIS — E559 Vitamin D deficiency, unspecified: Secondary | ICD-10-CM | POA: Diagnosis not present

## 2019-03-18 DIAGNOSIS — I2510 Atherosclerotic heart disease of native coronary artery without angina pectoris: Secondary | ICD-10-CM | POA: Diagnosis not present

## 2019-03-18 DIAGNOSIS — D519 Vitamin B12 deficiency anemia, unspecified: Secondary | ICD-10-CM | POA: Diagnosis not present

## 2019-03-20 DIAGNOSIS — I509 Heart failure, unspecified: Secondary | ICD-10-CM | POA: Diagnosis not present

## 2019-03-23 DIAGNOSIS — M6281 Muscle weakness (generalized): Secondary | ICD-10-CM | POA: Diagnosis not present

## 2019-03-23 DIAGNOSIS — R269 Unspecified abnormalities of gait and mobility: Secondary | ICD-10-CM | POA: Diagnosis not present

## 2019-03-23 DIAGNOSIS — M546 Pain in thoracic spine: Secondary | ICD-10-CM | POA: Diagnosis not present

## 2019-03-23 DIAGNOSIS — R54 Age-related physical debility: Secondary | ICD-10-CM | POA: Diagnosis not present

## 2019-03-24 DIAGNOSIS — R451 Restlessness and agitation: Secondary | ICD-10-CM | POA: Diagnosis not present

## 2019-03-24 DIAGNOSIS — F39 Unspecified mood [affective] disorder: Secondary | ICD-10-CM | POA: Diagnosis not present

## 2019-03-24 DIAGNOSIS — F419 Anxiety disorder, unspecified: Secondary | ICD-10-CM | POA: Diagnosis not present

## 2019-03-24 DIAGNOSIS — F0391 Unspecified dementia with behavioral disturbance: Secondary | ICD-10-CM | POA: Diagnosis not present

## 2019-04-08 DIAGNOSIS — M545 Low back pain: Secondary | ICD-10-CM | POA: Diagnosis not present

## 2019-04-08 DIAGNOSIS — G8929 Other chronic pain: Secondary | ICD-10-CM | POA: Diagnosis not present

## 2019-04-08 DIAGNOSIS — F0390 Unspecified dementia without behavioral disturbance: Secondary | ICD-10-CM | POA: Diagnosis not present

## 2019-04-20 DIAGNOSIS — I509 Heart failure, unspecified: Secondary | ICD-10-CM | POA: Diagnosis not present

## 2019-04-20 DIAGNOSIS — H903 Sensorineural hearing loss, bilateral: Secondary | ICD-10-CM | POA: Diagnosis not present

## 2019-04-21 DIAGNOSIS — F419 Anxiety disorder, unspecified: Secondary | ICD-10-CM | POA: Diagnosis not present

## 2019-04-21 DIAGNOSIS — R451 Restlessness and agitation: Secondary | ICD-10-CM | POA: Diagnosis not present

## 2019-04-21 DIAGNOSIS — F39 Unspecified mood [affective] disorder: Secondary | ICD-10-CM | POA: Diagnosis not present

## 2019-04-21 DIAGNOSIS — F0391 Unspecified dementia with behavioral disturbance: Secondary | ICD-10-CM | POA: Diagnosis not present

## 2019-05-20 DIAGNOSIS — I509 Heart failure, unspecified: Secondary | ICD-10-CM | POA: Diagnosis not present

## 2019-05-26 DIAGNOSIS — F39 Unspecified mood [affective] disorder: Secondary | ICD-10-CM | POA: Diagnosis not present

## 2019-05-26 DIAGNOSIS — F419 Anxiety disorder, unspecified: Secondary | ICD-10-CM | POA: Diagnosis not present

## 2019-05-26 DIAGNOSIS — F0391 Unspecified dementia with behavioral disturbance: Secondary | ICD-10-CM | POA: Diagnosis not present

## 2019-05-26 DIAGNOSIS — R451 Restlessness and agitation: Secondary | ICD-10-CM | POA: Diagnosis not present

## 2019-06-20 DIAGNOSIS — I509 Heart failure, unspecified: Secondary | ICD-10-CM | POA: Diagnosis not present

## 2019-06-25 DIAGNOSIS — R269 Unspecified abnormalities of gait and mobility: Secondary | ICD-10-CM | POA: Diagnosis not present

## 2019-06-25 DIAGNOSIS — G894 Chronic pain syndrome: Secondary | ICD-10-CM | POA: Diagnosis not present

## 2019-06-25 DIAGNOSIS — M545 Low back pain: Secondary | ICD-10-CM | POA: Diagnosis not present

## 2019-06-25 DIAGNOSIS — F0390 Unspecified dementia without behavioral disturbance: Secondary | ICD-10-CM | POA: Diagnosis not present

## 2019-07-01 DIAGNOSIS — E785 Hyperlipidemia, unspecified: Secondary | ICD-10-CM | POA: Diagnosis not present

## 2019-07-01 DIAGNOSIS — I10 Essential (primary) hypertension: Secondary | ICD-10-CM | POA: Diagnosis not present

## 2019-07-01 DIAGNOSIS — G894 Chronic pain syndrome: Secondary | ICD-10-CM | POA: Diagnosis not present

## 2019-07-01 DIAGNOSIS — I2510 Atherosclerotic heart disease of native coronary artery without angina pectoris: Secondary | ICD-10-CM | POA: Diagnosis not present

## 2019-07-02 DIAGNOSIS — I10 Essential (primary) hypertension: Secondary | ICD-10-CM | POA: Diagnosis not present

## 2019-07-02 DIAGNOSIS — D649 Anemia, unspecified: Secondary | ICD-10-CM | POA: Diagnosis not present

## 2019-07-02 DIAGNOSIS — E559 Vitamin D deficiency, unspecified: Secondary | ICD-10-CM | POA: Diagnosis not present

## 2019-07-02 DIAGNOSIS — E119 Type 2 diabetes mellitus without complications: Secondary | ICD-10-CM | POA: Diagnosis not present

## 2019-07-02 DIAGNOSIS — D510 Vitamin B12 deficiency anemia due to intrinsic factor deficiency: Secondary | ICD-10-CM | POA: Diagnosis not present

## 2019-07-03 DIAGNOSIS — Z9981 Dependence on supplemental oxygen: Secondary | ICD-10-CM | POA: Diagnosis not present

## 2019-07-03 DIAGNOSIS — Z9181 History of falling: Secondary | ICD-10-CM | POA: Diagnosis not present

## 2019-07-03 DIAGNOSIS — R2689 Other abnormalities of gait and mobility: Secondary | ICD-10-CM | POA: Diagnosis not present

## 2019-07-03 DIAGNOSIS — E039 Hypothyroidism, unspecified: Secondary | ICD-10-CM | POA: Diagnosis not present

## 2019-07-03 DIAGNOSIS — N400 Enlarged prostate without lower urinary tract symptoms: Secondary | ICD-10-CM | POA: Diagnosis not present

## 2019-07-03 DIAGNOSIS — I5032 Chronic diastolic (congestive) heart failure: Secondary | ICD-10-CM | POA: Diagnosis not present

## 2019-07-03 DIAGNOSIS — M1991 Primary osteoarthritis, unspecified site: Secondary | ICD-10-CM | POA: Diagnosis not present

## 2019-07-03 DIAGNOSIS — I2510 Atherosclerotic heart disease of native coronary artery without angina pectoris: Secondary | ICD-10-CM | POA: Diagnosis not present

## 2019-07-03 DIAGNOSIS — J449 Chronic obstructive pulmonary disease, unspecified: Secondary | ICD-10-CM | POA: Diagnosis not present

## 2019-07-03 DIAGNOSIS — M549 Dorsalgia, unspecified: Secondary | ICD-10-CM | POA: Diagnosis not present

## 2019-07-03 DIAGNOSIS — F0390 Unspecified dementia without behavioral disturbance: Secondary | ICD-10-CM | POA: Diagnosis not present

## 2019-07-03 DIAGNOSIS — Z7982 Long term (current) use of aspirin: Secondary | ICD-10-CM | POA: Diagnosis not present

## 2019-07-03 DIAGNOSIS — I110 Hypertensive heart disease with heart failure: Secondary | ICD-10-CM | POA: Diagnosis not present

## 2019-07-07 DIAGNOSIS — F0391 Unspecified dementia with behavioral disturbance: Secondary | ICD-10-CM | POA: Diagnosis not present

## 2019-07-07 DIAGNOSIS — F419 Anxiety disorder, unspecified: Secondary | ICD-10-CM | POA: Diagnosis not present

## 2019-07-07 DIAGNOSIS — F39 Unspecified mood [affective] disorder: Secondary | ICD-10-CM | POA: Diagnosis not present

## 2019-07-07 DIAGNOSIS — R451 Restlessness and agitation: Secondary | ICD-10-CM | POA: Diagnosis not present

## 2019-07-21 DIAGNOSIS — I509 Heart failure, unspecified: Secondary | ICD-10-CM | POA: Diagnosis not present

## 2019-07-30 DIAGNOSIS — Z23 Encounter for immunization: Secondary | ICD-10-CM | POA: Diagnosis not present

## 2019-08-03 DIAGNOSIS — I110 Hypertensive heart disease with heart failure: Secondary | ICD-10-CM | POA: Diagnosis not present

## 2019-08-03 DIAGNOSIS — I5032 Chronic diastolic (congestive) heart failure: Secondary | ICD-10-CM | POA: Diagnosis not present

## 2019-08-03 DIAGNOSIS — Z9181 History of falling: Secondary | ICD-10-CM | POA: Diagnosis not present

## 2019-08-03 DIAGNOSIS — F0390 Unspecified dementia without behavioral disturbance: Secondary | ICD-10-CM | POA: Diagnosis not present

## 2019-08-03 DIAGNOSIS — M549 Dorsalgia, unspecified: Secondary | ICD-10-CM | POA: Diagnosis not present

## 2019-08-03 DIAGNOSIS — I2510 Atherosclerotic heart disease of native coronary artery without angina pectoris: Secondary | ICD-10-CM | POA: Diagnosis not present

## 2019-08-03 DIAGNOSIS — Z7982 Long term (current) use of aspirin: Secondary | ICD-10-CM | POA: Diagnosis not present

## 2019-08-03 DIAGNOSIS — R2689 Other abnormalities of gait and mobility: Secondary | ICD-10-CM | POA: Diagnosis not present

## 2019-08-03 DIAGNOSIS — Z9981 Dependence on supplemental oxygen: Secondary | ICD-10-CM | POA: Diagnosis not present

## 2019-08-03 DIAGNOSIS — M1991 Primary osteoarthritis, unspecified site: Secondary | ICD-10-CM | POA: Diagnosis not present

## 2019-08-03 DIAGNOSIS — N400 Enlarged prostate without lower urinary tract symptoms: Secondary | ICD-10-CM | POA: Diagnosis not present

## 2019-08-03 DIAGNOSIS — J449 Chronic obstructive pulmonary disease, unspecified: Secondary | ICD-10-CM | POA: Diagnosis not present

## 2019-08-03 DIAGNOSIS — E039 Hypothyroidism, unspecified: Secondary | ICD-10-CM | POA: Diagnosis not present

## 2019-08-20 DIAGNOSIS — I509 Heart failure, unspecified: Secondary | ICD-10-CM | POA: Diagnosis not present

## 2019-08-21 DIAGNOSIS — Z79899 Other long term (current) drug therapy: Secondary | ICD-10-CM | POA: Diagnosis not present

## 2019-08-27 DIAGNOSIS — F419 Anxiety disorder, unspecified: Secondary | ICD-10-CM | POA: Diagnosis not present

## 2019-08-27 DIAGNOSIS — F339 Major depressive disorder, recurrent, unspecified: Secondary | ICD-10-CM | POA: Diagnosis not present

## 2019-09-01 DIAGNOSIS — F0390 Unspecified dementia without behavioral disturbance: Secondary | ICD-10-CM | POA: Diagnosis not present

## 2019-09-01 DIAGNOSIS — J449 Chronic obstructive pulmonary disease, unspecified: Secondary | ICD-10-CM | POA: Diagnosis not present

## 2019-09-01 DIAGNOSIS — I5032 Chronic diastolic (congestive) heart failure: Secondary | ICD-10-CM | POA: Diagnosis not present

## 2019-09-01 DIAGNOSIS — I110 Hypertensive heart disease with heart failure: Secondary | ICD-10-CM | POA: Diagnosis not present

## 2019-09-07 DIAGNOSIS — E039 Hypothyroidism, unspecified: Secondary | ICD-10-CM | POA: Diagnosis not present

## 2019-09-07 DIAGNOSIS — I5032 Chronic diastolic (congestive) heart failure: Secondary | ICD-10-CM | POA: Diagnosis not present

## 2019-09-07 DIAGNOSIS — I2510 Atherosclerotic heart disease of native coronary artery without angina pectoris: Secondary | ICD-10-CM | POA: Diagnosis not present

## 2019-09-07 DIAGNOSIS — M549 Dorsalgia, unspecified: Secondary | ICD-10-CM | POA: Diagnosis not present

## 2019-09-07 DIAGNOSIS — F0390 Unspecified dementia without behavioral disturbance: Secondary | ICD-10-CM | POA: Diagnosis not present

## 2019-09-07 DIAGNOSIS — Z7982 Long term (current) use of aspirin: Secondary | ICD-10-CM | POA: Diagnosis not present

## 2019-09-07 DIAGNOSIS — J449 Chronic obstructive pulmonary disease, unspecified: Secondary | ICD-10-CM | POA: Diagnosis not present

## 2019-09-07 DIAGNOSIS — N400 Enlarged prostate without lower urinary tract symptoms: Secondary | ICD-10-CM | POA: Diagnosis not present

## 2019-09-07 DIAGNOSIS — Z9181 History of falling: Secondary | ICD-10-CM | POA: Diagnosis not present

## 2019-09-07 DIAGNOSIS — R2689 Other abnormalities of gait and mobility: Secondary | ICD-10-CM | POA: Diagnosis not present

## 2019-09-07 DIAGNOSIS — I110 Hypertensive heart disease with heart failure: Secondary | ICD-10-CM | POA: Diagnosis not present

## 2019-09-07 DIAGNOSIS — M1991 Primary osteoarthritis, unspecified site: Secondary | ICD-10-CM | POA: Diagnosis not present

## 2019-09-07 DIAGNOSIS — Z9981 Dependence on supplemental oxygen: Secondary | ICD-10-CM | POA: Diagnosis not present

## 2019-09-20 DIAGNOSIS — I509 Heart failure, unspecified: Secondary | ICD-10-CM | POA: Diagnosis not present

## 2019-09-23 DIAGNOSIS — Z20828 Contact with and (suspected) exposure to other viral communicable diseases: Secondary | ICD-10-CM | POA: Diagnosis not present

## 2019-09-23 DIAGNOSIS — R54 Age-related physical debility: Secondary | ICD-10-CM | POA: Diagnosis not present

## 2019-09-30 DIAGNOSIS — Z20828 Contact with and (suspected) exposure to other viral communicable diseases: Secondary | ICD-10-CM | POA: Diagnosis not present

## 2019-09-30 DIAGNOSIS — R54 Age-related physical debility: Secondary | ICD-10-CM | POA: Diagnosis not present

## 2019-10-06 DIAGNOSIS — M549 Dorsalgia, unspecified: Secondary | ICD-10-CM | POA: Diagnosis not present

## 2019-10-06 DIAGNOSIS — M1991 Primary osteoarthritis, unspecified site: Secondary | ICD-10-CM | POA: Diagnosis not present

## 2019-10-06 DIAGNOSIS — I2510 Atherosclerotic heart disease of native coronary artery without angina pectoris: Secondary | ICD-10-CM | POA: Diagnosis not present

## 2019-10-06 DIAGNOSIS — I110 Hypertensive heart disease with heart failure: Secondary | ICD-10-CM | POA: Diagnosis not present

## 2019-10-06 DIAGNOSIS — J449 Chronic obstructive pulmonary disease, unspecified: Secondary | ICD-10-CM | POA: Diagnosis not present

## 2019-10-06 DIAGNOSIS — I5032 Chronic diastolic (congestive) heart failure: Secondary | ICD-10-CM | POA: Diagnosis not present

## 2019-10-06 DIAGNOSIS — E039 Hypothyroidism, unspecified: Secondary | ICD-10-CM | POA: Diagnosis not present

## 2019-10-06 DIAGNOSIS — N400 Enlarged prostate without lower urinary tract symptoms: Secondary | ICD-10-CM | POA: Diagnosis not present

## 2019-10-06 DIAGNOSIS — Z9981 Dependence on supplemental oxygen: Secondary | ICD-10-CM | POA: Diagnosis not present

## 2019-10-06 DIAGNOSIS — Z9181 History of falling: Secondary | ICD-10-CM | POA: Diagnosis not present

## 2019-10-06 DIAGNOSIS — Z7982 Long term (current) use of aspirin: Secondary | ICD-10-CM | POA: Diagnosis not present

## 2019-10-06 DIAGNOSIS — F0390 Unspecified dementia without behavioral disturbance: Secondary | ICD-10-CM | POA: Diagnosis not present

## 2019-10-06 DIAGNOSIS — R2689 Other abnormalities of gait and mobility: Secondary | ICD-10-CM | POA: Diagnosis not present

## 2019-10-07 DIAGNOSIS — Z20828 Contact with and (suspected) exposure to other viral communicable diseases: Secondary | ICD-10-CM | POA: Diagnosis not present

## 2019-10-07 DIAGNOSIS — R54 Age-related physical debility: Secondary | ICD-10-CM | POA: Diagnosis not present

## 2019-10-14 DIAGNOSIS — Z20828 Contact with and (suspected) exposure to other viral communicable diseases: Secondary | ICD-10-CM | POA: Diagnosis not present

## 2019-10-14 DIAGNOSIS — R54 Age-related physical debility: Secondary | ICD-10-CM | POA: Diagnosis not present

## 2019-10-20 DIAGNOSIS — I509 Heart failure, unspecified: Secondary | ICD-10-CM | POA: Diagnosis not present

## 2019-10-21 DIAGNOSIS — R54 Age-related physical debility: Secondary | ICD-10-CM | POA: Diagnosis not present

## 2019-10-21 DIAGNOSIS — Z20828 Contact with and (suspected) exposure to other viral communicable diseases: Secondary | ICD-10-CM | POA: Diagnosis not present

## 2019-10-28 DIAGNOSIS — Z20828 Contact with and (suspected) exposure to other viral communicable diseases: Secondary | ICD-10-CM | POA: Diagnosis not present

## 2019-10-28 DIAGNOSIS — R54 Age-related physical debility: Secondary | ICD-10-CM | POA: Diagnosis not present

## 2019-10-31 DIAGNOSIS — F0390 Unspecified dementia without behavioral disturbance: Secondary | ICD-10-CM | POA: Diagnosis not present

## 2019-10-31 DIAGNOSIS — J449 Chronic obstructive pulmonary disease, unspecified: Secondary | ICD-10-CM | POA: Diagnosis not present

## 2019-10-31 DIAGNOSIS — I10 Essential (primary) hypertension: Secondary | ICD-10-CM | POA: Diagnosis not present

## 2019-10-31 DIAGNOSIS — I5032 Chronic diastolic (congestive) heart failure: Secondary | ICD-10-CM | POA: Diagnosis not present

## 2019-11-02 DIAGNOSIS — Z9181 History of falling: Secondary | ICD-10-CM | POA: Diagnosis not present

## 2019-11-02 DIAGNOSIS — E039 Hypothyroidism, unspecified: Secondary | ICD-10-CM | POA: Diagnosis not present

## 2019-11-02 DIAGNOSIS — Z7982 Long term (current) use of aspirin: Secondary | ICD-10-CM | POA: Diagnosis not present

## 2019-11-02 DIAGNOSIS — F0390 Unspecified dementia without behavioral disturbance: Secondary | ICD-10-CM | POA: Diagnosis not present

## 2019-11-02 DIAGNOSIS — I5032 Chronic diastolic (congestive) heart failure: Secondary | ICD-10-CM | POA: Diagnosis not present

## 2019-11-02 DIAGNOSIS — I2510 Atherosclerotic heart disease of native coronary artery without angina pectoris: Secondary | ICD-10-CM | POA: Diagnosis not present

## 2019-11-02 DIAGNOSIS — R2689 Other abnormalities of gait and mobility: Secondary | ICD-10-CM | POA: Diagnosis not present

## 2019-11-02 DIAGNOSIS — Z9981 Dependence on supplemental oxygen: Secondary | ICD-10-CM | POA: Diagnosis not present

## 2019-11-02 DIAGNOSIS — N400 Enlarged prostate without lower urinary tract symptoms: Secondary | ICD-10-CM | POA: Diagnosis not present

## 2019-11-02 DIAGNOSIS — I110 Hypertensive heart disease with heart failure: Secondary | ICD-10-CM | POA: Diagnosis not present

## 2019-11-02 DIAGNOSIS — J449 Chronic obstructive pulmonary disease, unspecified: Secondary | ICD-10-CM | POA: Diagnosis not present

## 2019-11-02 DIAGNOSIS — M1991 Primary osteoarthritis, unspecified site: Secondary | ICD-10-CM | POA: Diagnosis not present

## 2019-11-02 DIAGNOSIS — M549 Dorsalgia, unspecified: Secondary | ICD-10-CM | POA: Diagnosis not present

## 2019-11-04 DIAGNOSIS — Z20828 Contact with and (suspected) exposure to other viral communicable diseases: Secondary | ICD-10-CM | POA: Diagnosis not present

## 2019-11-04 DIAGNOSIS — R54 Age-related physical debility: Secondary | ICD-10-CM | POA: Diagnosis not present

## 2019-11-11 DIAGNOSIS — Z20828 Contact with and (suspected) exposure to other viral communicable diseases: Secondary | ICD-10-CM | POA: Diagnosis not present

## 2019-11-11 DIAGNOSIS — R54 Age-related physical debility: Secondary | ICD-10-CM | POA: Diagnosis not present

## 2019-11-16 DIAGNOSIS — F0390 Unspecified dementia without behavioral disturbance: Secondary | ICD-10-CM | POA: Diagnosis not present

## 2019-11-16 DIAGNOSIS — M6281 Muscle weakness (generalized): Secondary | ICD-10-CM | POA: Diagnosis not present

## 2019-11-16 DIAGNOSIS — U071 COVID-19: Secondary | ICD-10-CM | POA: Diagnosis not present

## 2019-11-19 DIAGNOSIS — R451 Restlessness and agitation: Secondary | ICD-10-CM | POA: Diagnosis not present

## 2019-11-19 DIAGNOSIS — R1084 Generalized abdominal pain: Secondary | ICD-10-CM | POA: Diagnosis not present

## 2019-11-19 DIAGNOSIS — U071 COVID-19: Secondary | ICD-10-CM | POA: Diagnosis not present

## 2019-11-19 DIAGNOSIS — F0390 Unspecified dementia without behavioral disturbance: Secondary | ICD-10-CM | POA: Diagnosis not present

## 2019-11-26 DIAGNOSIS — U071 COVID-19: Secondary | ICD-10-CM | POA: Diagnosis not present

## 2019-11-26 DIAGNOSIS — R5381 Other malaise: Secondary | ICD-10-CM | POA: Diagnosis not present

## 2019-11-26 DIAGNOSIS — F0390 Unspecified dementia without behavioral disturbance: Secondary | ICD-10-CM | POA: Diagnosis not present

## 2019-11-26 DIAGNOSIS — R0989 Other specified symptoms and signs involving the circulatory and respiratory systems: Secondary | ICD-10-CM | POA: Diagnosis not present

## 2019-11-28 DIAGNOSIS — I509 Heart failure, unspecified: Secondary | ICD-10-CM | POA: Diagnosis not present

## 2019-11-28 DIAGNOSIS — R0602 Shortness of breath: Secondary | ICD-10-CM | POA: Diagnosis not present

## 2019-11-28 DIAGNOSIS — R279 Unspecified lack of coordination: Secondary | ICD-10-CM | POA: Diagnosis not present

## 2019-11-28 DIAGNOSIS — Z743 Need for continuous supervision: Secondary | ICD-10-CM | POA: Diagnosis not present

## 2019-11-28 DIAGNOSIS — R0902 Hypoxemia: Secondary | ICD-10-CM | POA: Diagnosis not present

## 2019-11-28 DIAGNOSIS — J189 Pneumonia, unspecified organism: Secondary | ICD-10-CM | POA: Diagnosis not present

## 2019-11-28 DIAGNOSIS — I110 Hypertensive heart disease with heart failure: Secondary | ICD-10-CM | POA: Diagnosis not present

## 2019-11-28 DIAGNOSIS — J1282 Pneumonia due to coronavirus disease 2019: Secondary | ICD-10-CM | POA: Diagnosis not present

## 2019-11-28 DIAGNOSIS — U071 COVID-19: Secondary | ICD-10-CM | POA: Diagnosis not present

## 2019-11-30 DIAGNOSIS — I5032 Chronic diastolic (congestive) heart failure: Secondary | ICD-10-CM | POA: Diagnosis not present

## 2019-11-30 DIAGNOSIS — M6281 Muscle weakness (generalized): Secondary | ICD-10-CM | POA: Diagnosis not present

## 2019-11-30 DIAGNOSIS — U071 COVID-19: Secondary | ICD-10-CM | POA: Diagnosis not present

## 2019-11-30 DIAGNOSIS — M549 Dorsalgia, unspecified: Secondary | ICD-10-CM | POA: Diagnosis not present

## 2019-11-30 DIAGNOSIS — F0390 Unspecified dementia without behavioral disturbance: Secondary | ICD-10-CM | POA: Diagnosis not present

## 2019-11-30 DIAGNOSIS — N400 Enlarged prostate without lower urinary tract symptoms: Secondary | ICD-10-CM | POA: Diagnosis not present

## 2019-11-30 DIAGNOSIS — R2689 Other abnormalities of gait and mobility: Secondary | ICD-10-CM | POA: Diagnosis not present

## 2019-11-30 DIAGNOSIS — I2510 Atherosclerotic heart disease of native coronary artery without angina pectoris: Secondary | ICD-10-CM | POA: Diagnosis not present

## 2019-11-30 DIAGNOSIS — I110 Hypertensive heart disease with heart failure: Secondary | ICD-10-CM | POA: Diagnosis not present

## 2019-11-30 DIAGNOSIS — M1991 Primary osteoarthritis, unspecified site: Secondary | ICD-10-CM | POA: Diagnosis not present

## 2019-11-30 DIAGNOSIS — J449 Chronic obstructive pulmonary disease, unspecified: Secondary | ICD-10-CM | POA: Diagnosis not present

## 2019-11-30 DIAGNOSIS — E039 Hypothyroidism, unspecified: Secondary | ICD-10-CM | POA: Diagnosis not present

## 2019-11-30 DIAGNOSIS — Z9181 History of falling: Secondary | ICD-10-CM | POA: Diagnosis not present

## 2019-11-30 DIAGNOSIS — Z9981 Dependence on supplemental oxygen: Secondary | ICD-10-CM | POA: Diagnosis not present

## 2019-11-30 DIAGNOSIS — Z7982 Long term (current) use of aspirin: Secondary | ICD-10-CM | POA: Diagnosis not present

## 2019-12-04 DIAGNOSIS — U071 COVID-19: Secondary | ICD-10-CM | POA: Diagnosis not present

## 2019-12-04 DIAGNOSIS — J449 Chronic obstructive pulmonary disease, unspecified: Secondary | ICD-10-CM | POA: Diagnosis not present

## 2019-12-04 DIAGNOSIS — I10 Essential (primary) hypertension: Secondary | ICD-10-CM | POA: Diagnosis not present

## 2019-12-04 DIAGNOSIS — I5032 Chronic diastolic (congestive) heart failure: Secondary | ICD-10-CM | POA: Diagnosis not present

## 2019-12-08 DIAGNOSIS — D649 Anemia, unspecified: Secondary | ICD-10-CM | POA: Diagnosis not present

## 2019-12-08 DIAGNOSIS — E039 Hypothyroidism, unspecified: Secondary | ICD-10-CM | POA: Diagnosis not present

## 2019-12-08 DIAGNOSIS — I10 Essential (primary) hypertension: Secondary | ICD-10-CM | POA: Diagnosis not present

## 2019-12-08 DIAGNOSIS — F329 Major depressive disorder, single episode, unspecified: Secondary | ICD-10-CM | POA: Diagnosis not present

## 2019-12-24 DIAGNOSIS — Z8616 Personal history of COVID-19: Secondary | ICD-10-CM | POA: Diagnosis not present

## 2019-12-24 DIAGNOSIS — F0390 Unspecified dementia without behavioral disturbance: Secondary | ICD-10-CM | POA: Diagnosis not present

## 2019-12-24 DIAGNOSIS — R630 Anorexia: Secondary | ICD-10-CM | POA: Diagnosis not present

## 2019-12-30 DIAGNOSIS — Z9181 History of falling: Secondary | ICD-10-CM | POA: Diagnosis not present

## 2019-12-30 DIAGNOSIS — M549 Dorsalgia, unspecified: Secondary | ICD-10-CM | POA: Diagnosis not present

## 2019-12-30 DIAGNOSIS — R2689 Other abnormalities of gait and mobility: Secondary | ICD-10-CM | POA: Diagnosis not present

## 2019-12-30 DIAGNOSIS — Z9981 Dependence on supplemental oxygen: Secondary | ICD-10-CM | POA: Diagnosis not present

## 2019-12-30 DIAGNOSIS — F0390 Unspecified dementia without behavioral disturbance: Secondary | ICD-10-CM | POA: Diagnosis not present

## 2019-12-30 DIAGNOSIS — J449 Chronic obstructive pulmonary disease, unspecified: Secondary | ICD-10-CM | POA: Diagnosis not present

## 2019-12-30 DIAGNOSIS — M1991 Primary osteoarthritis, unspecified site: Secondary | ICD-10-CM | POA: Diagnosis not present

## 2019-12-30 DIAGNOSIS — I110 Hypertensive heart disease with heart failure: Secondary | ICD-10-CM | POA: Diagnosis not present

## 2019-12-30 DIAGNOSIS — I5032 Chronic diastolic (congestive) heart failure: Secondary | ICD-10-CM | POA: Diagnosis not present

## 2019-12-30 DIAGNOSIS — I2510 Atherosclerotic heart disease of native coronary artery without angina pectoris: Secondary | ICD-10-CM | POA: Diagnosis not present

## 2019-12-30 DIAGNOSIS — N400 Enlarged prostate without lower urinary tract symptoms: Secondary | ICD-10-CM | POA: Diagnosis not present

## 2019-12-30 DIAGNOSIS — E039 Hypothyroidism, unspecified: Secondary | ICD-10-CM | POA: Diagnosis not present

## 2019-12-30 DIAGNOSIS — Z7982 Long term (current) use of aspirin: Secondary | ICD-10-CM | POA: Diagnosis not present

## 2020-01-04 DIAGNOSIS — I5032 Chronic diastolic (congestive) heart failure: Secondary | ICD-10-CM | POA: Diagnosis not present

## 2020-01-04 DIAGNOSIS — R600 Localized edema: Secondary | ICD-10-CM | POA: Diagnosis not present

## 2020-01-04 DIAGNOSIS — F0390 Unspecified dementia without behavioral disturbance: Secondary | ICD-10-CM | POA: Diagnosis not present

## 2020-01-04 DIAGNOSIS — S41112A Laceration without foreign body of left upper arm, initial encounter: Secondary | ICD-10-CM | POA: Diagnosis not present

## 2020-01-29 DIAGNOSIS — I2510 Atherosclerotic heart disease of native coronary artery without angina pectoris: Secondary | ICD-10-CM | POA: Diagnosis not present

## 2020-01-29 DIAGNOSIS — M549 Dorsalgia, unspecified: Secondary | ICD-10-CM | POA: Diagnosis not present

## 2020-01-29 DIAGNOSIS — I5032 Chronic diastolic (congestive) heart failure: Secondary | ICD-10-CM | POA: Diagnosis not present

## 2020-01-29 DIAGNOSIS — Z9181 History of falling: Secondary | ICD-10-CM | POA: Diagnosis not present

## 2020-01-29 DIAGNOSIS — Z7982 Long term (current) use of aspirin: Secondary | ICD-10-CM | POA: Diagnosis not present

## 2020-01-29 DIAGNOSIS — E039 Hypothyroidism, unspecified: Secondary | ICD-10-CM | POA: Diagnosis not present

## 2020-01-29 DIAGNOSIS — M1991 Primary osteoarthritis, unspecified site: Secondary | ICD-10-CM | POA: Diagnosis not present

## 2020-01-29 DIAGNOSIS — F0390 Unspecified dementia without behavioral disturbance: Secondary | ICD-10-CM | POA: Diagnosis not present

## 2020-01-29 DIAGNOSIS — Z9981 Dependence on supplemental oxygen: Secondary | ICD-10-CM | POA: Diagnosis not present

## 2020-01-29 DIAGNOSIS — N400 Enlarged prostate without lower urinary tract symptoms: Secondary | ICD-10-CM | POA: Diagnosis not present

## 2020-01-29 DIAGNOSIS — J449 Chronic obstructive pulmonary disease, unspecified: Secondary | ICD-10-CM | POA: Diagnosis not present

## 2020-01-29 DIAGNOSIS — I110 Hypertensive heart disease with heart failure: Secondary | ICD-10-CM | POA: Diagnosis not present

## 2020-01-29 DIAGNOSIS — R2689 Other abnormalities of gait and mobility: Secondary | ICD-10-CM | POA: Diagnosis not present

## 2020-02-03 DIAGNOSIS — M1990 Unspecified osteoarthritis, unspecified site: Secondary | ICD-10-CM | POA: Diagnosis not present

## 2020-02-03 DIAGNOSIS — F0390 Unspecified dementia without behavioral disturbance: Secondary | ICD-10-CM | POA: Diagnosis not present

## 2020-02-03 DIAGNOSIS — M6281 Muscle weakness (generalized): Secondary | ICD-10-CM | POA: Diagnosis not present

## 2020-02-03 DIAGNOSIS — R269 Unspecified abnormalities of gait and mobility: Secondary | ICD-10-CM | POA: Diagnosis not present

## 2020-02-28 DIAGNOSIS — M549 Dorsalgia, unspecified: Secondary | ICD-10-CM | POA: Diagnosis not present

## 2020-02-28 DIAGNOSIS — I2510 Atherosclerotic heart disease of native coronary artery without angina pectoris: Secondary | ICD-10-CM | POA: Diagnosis not present

## 2020-02-28 DIAGNOSIS — I110 Hypertensive heart disease with heart failure: Secondary | ICD-10-CM | POA: Diagnosis not present

## 2020-02-28 DIAGNOSIS — Z9181 History of falling: Secondary | ICD-10-CM | POA: Diagnosis not present

## 2020-02-28 DIAGNOSIS — Z7982 Long term (current) use of aspirin: Secondary | ICD-10-CM | POA: Diagnosis not present

## 2020-02-28 DIAGNOSIS — J449 Chronic obstructive pulmonary disease, unspecified: Secondary | ICD-10-CM | POA: Diagnosis not present

## 2020-02-28 DIAGNOSIS — I5032 Chronic diastolic (congestive) heart failure: Secondary | ICD-10-CM | POA: Diagnosis not present

## 2020-02-28 DIAGNOSIS — E039 Hypothyroidism, unspecified: Secondary | ICD-10-CM | POA: Diagnosis not present

## 2020-02-28 DIAGNOSIS — M1991 Primary osteoarthritis, unspecified site: Secondary | ICD-10-CM | POA: Diagnosis not present

## 2020-02-28 DIAGNOSIS — Z9981 Dependence on supplemental oxygen: Secondary | ICD-10-CM | POA: Diagnosis not present

## 2020-02-28 DIAGNOSIS — F0390 Unspecified dementia without behavioral disturbance: Secondary | ICD-10-CM | POA: Diagnosis not present

## 2020-02-28 DIAGNOSIS — R2689 Other abnormalities of gait and mobility: Secondary | ICD-10-CM | POA: Diagnosis not present

## 2020-02-28 DIAGNOSIS — N400 Enlarged prostate without lower urinary tract symptoms: Secondary | ICD-10-CM | POA: Diagnosis not present

## 2020-02-29 DIAGNOSIS — H903 Sensorineural hearing loss, bilateral: Secondary | ICD-10-CM | POA: Diagnosis not present

## 2020-03-24 DIAGNOSIS — H04123 Dry eye syndrome of bilateral lacrimal glands: Secondary | ICD-10-CM | POA: Diagnosis not present

## 2020-03-24 DIAGNOSIS — F0390 Unspecified dementia without behavioral disturbance: Secondary | ICD-10-CM | POA: Diagnosis not present

## 2020-03-29 DIAGNOSIS — Z7982 Long term (current) use of aspirin: Secondary | ICD-10-CM | POA: Diagnosis not present

## 2020-03-29 DIAGNOSIS — F419 Anxiety disorder, unspecified: Secondary | ICD-10-CM | POA: Diagnosis not present

## 2020-03-29 DIAGNOSIS — I110 Hypertensive heart disease with heart failure: Secondary | ICD-10-CM | POA: Diagnosis not present

## 2020-03-29 DIAGNOSIS — I2510 Atherosclerotic heart disease of native coronary artery without angina pectoris: Secondary | ICD-10-CM | POA: Diagnosis not present

## 2020-03-29 DIAGNOSIS — M549 Dorsalgia, unspecified: Secondary | ICD-10-CM | POA: Diagnosis not present

## 2020-03-29 DIAGNOSIS — J449 Chronic obstructive pulmonary disease, unspecified: Secondary | ICD-10-CM | POA: Diagnosis not present

## 2020-03-29 DIAGNOSIS — N400 Enlarged prostate without lower urinary tract symptoms: Secondary | ICD-10-CM | POA: Diagnosis not present

## 2020-03-29 DIAGNOSIS — M1991 Primary osteoarthritis, unspecified site: Secondary | ICD-10-CM | POA: Diagnosis not present

## 2020-03-29 DIAGNOSIS — I5032 Chronic diastolic (congestive) heart failure: Secondary | ICD-10-CM | POA: Diagnosis not present

## 2020-03-29 DIAGNOSIS — R451 Restlessness and agitation: Secondary | ICD-10-CM | POA: Diagnosis not present

## 2020-03-29 DIAGNOSIS — Z9181 History of falling: Secondary | ICD-10-CM | POA: Diagnosis not present

## 2020-03-29 DIAGNOSIS — Z9981 Dependence on supplemental oxygen: Secondary | ICD-10-CM | POA: Diagnosis not present

## 2020-03-29 DIAGNOSIS — F39 Unspecified mood [affective] disorder: Secondary | ICD-10-CM | POA: Diagnosis not present

## 2020-03-29 DIAGNOSIS — F0390 Unspecified dementia without behavioral disturbance: Secondary | ICD-10-CM | POA: Diagnosis not present

## 2020-03-29 DIAGNOSIS — F0391 Unspecified dementia with behavioral disturbance: Secondary | ICD-10-CM | POA: Diagnosis not present

## 2020-03-29 DIAGNOSIS — R2689 Other abnormalities of gait and mobility: Secondary | ICD-10-CM | POA: Diagnosis not present

## 2020-03-29 DIAGNOSIS — E039 Hypothyroidism, unspecified: Secondary | ICD-10-CM | POA: Diagnosis not present

## 2020-04-04 DIAGNOSIS — S0081XA Abrasion of other part of head, initial encounter: Secondary | ICD-10-CM | POA: Diagnosis not present

## 2020-04-04 DIAGNOSIS — F0390 Unspecified dementia without behavioral disturbance: Secondary | ICD-10-CM | POA: Diagnosis not present

## 2020-04-04 DIAGNOSIS — M6281 Muscle weakness (generalized): Secondary | ICD-10-CM | POA: Diagnosis not present

## 2020-04-04 DIAGNOSIS — R269 Unspecified abnormalities of gait and mobility: Secondary | ICD-10-CM | POA: Diagnosis not present

## 2020-04-08 DIAGNOSIS — F0390 Unspecified dementia without behavioral disturbance: Secondary | ICD-10-CM | POA: Diagnosis not present

## 2020-04-08 DIAGNOSIS — R269 Unspecified abnormalities of gait and mobility: Secondary | ICD-10-CM | POA: Diagnosis not present

## 2020-04-08 DIAGNOSIS — J449 Chronic obstructive pulmonary disease, unspecified: Secondary | ICD-10-CM | POA: Diagnosis not present

## 2020-04-08 DIAGNOSIS — M6281 Muscle weakness (generalized): Secondary | ICD-10-CM | POA: Diagnosis not present

## 2020-04-12 DIAGNOSIS — D510 Vitamin B12 deficiency anemia due to intrinsic factor deficiency: Secondary | ICD-10-CM | POA: Diagnosis not present

## 2020-04-12 DIAGNOSIS — Z79899 Other long term (current) drug therapy: Secondary | ICD-10-CM | POA: Diagnosis not present

## 2020-04-12 DIAGNOSIS — E119 Type 2 diabetes mellitus without complications: Secondary | ICD-10-CM | POA: Diagnosis not present

## 2020-04-12 DIAGNOSIS — I10 Essential (primary) hypertension: Secondary | ICD-10-CM | POA: Diagnosis not present

## 2020-04-12 DIAGNOSIS — E559 Vitamin D deficiency, unspecified: Secondary | ICD-10-CM | POA: Diagnosis not present

## 2020-04-12 DIAGNOSIS — D649 Anemia, unspecified: Secondary | ICD-10-CM | POA: Diagnosis not present

## 2020-04-13 DIAGNOSIS — H5712 Ocular pain, left eye: Secondary | ICD-10-CM | POA: Diagnosis not present

## 2020-04-13 DIAGNOSIS — H1032 Unspecified acute conjunctivitis, left eye: Secondary | ICD-10-CM | POA: Diagnosis not present

## 2020-04-13 DIAGNOSIS — F0390 Unspecified dementia without behavioral disturbance: Secondary | ICD-10-CM | POA: Diagnosis not present

## 2020-04-21 DIAGNOSIS — Z20828 Contact with and (suspected) exposure to other viral communicable diseases: Secondary | ICD-10-CM | POA: Diagnosis not present

## 2020-04-21 DIAGNOSIS — Z20822 Contact with and (suspected) exposure to covid-19: Secondary | ICD-10-CM | POA: Diagnosis not present

## 2020-04-21 DIAGNOSIS — R54 Age-related physical debility: Secondary | ICD-10-CM | POA: Diagnosis not present

## 2020-04-26 DIAGNOSIS — F39 Unspecified mood [affective] disorder: Secondary | ICD-10-CM | POA: Diagnosis not present

## 2020-04-26 DIAGNOSIS — R451 Restlessness and agitation: Secondary | ICD-10-CM | POA: Diagnosis not present

## 2020-04-26 DIAGNOSIS — F419 Anxiety disorder, unspecified: Secondary | ICD-10-CM | POA: Diagnosis not present

## 2020-04-26 DIAGNOSIS — F0391 Unspecified dementia with behavioral disturbance: Secondary | ICD-10-CM | POA: Diagnosis not present

## 2020-04-27 DIAGNOSIS — Z20822 Contact with and (suspected) exposure to covid-19: Secondary | ICD-10-CM | POA: Diagnosis not present

## 2020-04-27 DIAGNOSIS — Z20828 Contact with and (suspected) exposure to other viral communicable diseases: Secondary | ICD-10-CM | POA: Diagnosis not present

## 2020-04-27 DIAGNOSIS — R54 Age-related physical debility: Secondary | ICD-10-CM | POA: Diagnosis not present

## 2020-04-29 DIAGNOSIS — F0390 Unspecified dementia without behavioral disturbance: Secondary | ICD-10-CM | POA: Diagnosis not present

## 2020-04-29 DIAGNOSIS — F419 Anxiety disorder, unspecified: Secondary | ICD-10-CM | POA: Diagnosis not present

## 2020-04-29 DIAGNOSIS — H04129 Dry eye syndrome of unspecified lacrimal gland: Secondary | ICD-10-CM | POA: Diagnosis not present

## 2020-05-18 DIAGNOSIS — F419 Anxiety disorder, unspecified: Secondary | ICD-10-CM | POA: Diagnosis not present

## 2020-05-18 DIAGNOSIS — R451 Restlessness and agitation: Secondary | ICD-10-CM | POA: Diagnosis not present

## 2020-05-18 DIAGNOSIS — F0391 Unspecified dementia with behavioral disturbance: Secondary | ICD-10-CM | POA: Diagnosis not present

## 2020-05-18 DIAGNOSIS — F39 Unspecified mood [affective] disorder: Secondary | ICD-10-CM | POA: Diagnosis not present

## 2020-05-24 DIAGNOSIS — L609 Nail disorder, unspecified: Secondary | ICD-10-CM | POA: Diagnosis not present

## 2020-05-24 DIAGNOSIS — F330 Major depressive disorder, recurrent, mild: Secondary | ICD-10-CM | POA: Diagnosis not present

## 2020-05-24 DIAGNOSIS — F419 Anxiety disorder, unspecified: Secondary | ICD-10-CM | POA: Diagnosis not present

## 2020-05-24 DIAGNOSIS — B351 Tinea unguium: Secondary | ICD-10-CM | POA: Diagnosis not present

## 2020-05-24 DIAGNOSIS — F39 Unspecified mood [affective] disorder: Secondary | ICD-10-CM | POA: Diagnosis not present

## 2020-06-01 DIAGNOSIS — F419 Anxiety disorder, unspecified: Secondary | ICD-10-CM | POA: Diagnosis not present

## 2020-06-01 DIAGNOSIS — F0391 Unspecified dementia with behavioral disturbance: Secondary | ICD-10-CM | POA: Diagnosis not present

## 2020-06-01 DIAGNOSIS — R451 Restlessness and agitation: Secondary | ICD-10-CM | POA: Diagnosis not present

## 2020-06-01 DIAGNOSIS — F39 Unspecified mood [affective] disorder: Secondary | ICD-10-CM | POA: Diagnosis not present

## 2020-06-09 DIAGNOSIS — Z20828 Contact with and (suspected) exposure to other viral communicable diseases: Secondary | ICD-10-CM | POA: Diagnosis not present

## 2020-06-10 DIAGNOSIS — F330 Major depressive disorder, recurrent, mild: Secondary | ICD-10-CM | POA: Diagnosis not present

## 2020-06-10 DIAGNOSIS — F419 Anxiety disorder, unspecified: Secondary | ICD-10-CM | POA: Diagnosis not present

## 2020-06-15 DIAGNOSIS — R451 Restlessness and agitation: Secondary | ICD-10-CM | POA: Diagnosis not present

## 2020-06-15 DIAGNOSIS — F419 Anxiety disorder, unspecified: Secondary | ICD-10-CM | POA: Diagnosis not present

## 2020-06-15 DIAGNOSIS — F0391 Unspecified dementia with behavioral disturbance: Secondary | ICD-10-CM | POA: Diagnosis not present

## 2020-06-15 DIAGNOSIS — F39 Unspecified mood [affective] disorder: Secondary | ICD-10-CM | POA: Diagnosis not present

## 2020-06-16 DIAGNOSIS — Z20828 Contact with and (suspected) exposure to other viral communicable diseases: Secondary | ICD-10-CM | POA: Diagnosis not present

## 2020-06-23 DIAGNOSIS — Z20828 Contact with and (suspected) exposure to other viral communicable diseases: Secondary | ICD-10-CM | POA: Diagnosis not present

## 2020-06-24 DIAGNOSIS — F419 Anxiety disorder, unspecified: Secondary | ICD-10-CM | POA: Diagnosis not present

## 2020-06-24 DIAGNOSIS — F330 Major depressive disorder, recurrent, mild: Secondary | ICD-10-CM | POA: Diagnosis not present

## 2020-06-29 DIAGNOSIS — Z20828 Contact with and (suspected) exposure to other viral communicable diseases: Secondary | ICD-10-CM | POA: Diagnosis not present

## 2020-07-06 DIAGNOSIS — Z20828 Contact with and (suspected) exposure to other viral communicable diseases: Secondary | ICD-10-CM | POA: Diagnosis not present

## 2020-07-11 DIAGNOSIS — F330 Major depressive disorder, recurrent, mild: Secondary | ICD-10-CM | POA: Diagnosis not present

## 2020-07-11 DIAGNOSIS — F419 Anxiety disorder, unspecified: Secondary | ICD-10-CM | POA: Diagnosis not present

## 2020-07-13 DIAGNOSIS — R451 Restlessness and agitation: Secondary | ICD-10-CM | POA: Diagnosis not present

## 2020-07-13 DIAGNOSIS — F419 Anxiety disorder, unspecified: Secondary | ICD-10-CM | POA: Diagnosis not present

## 2020-07-13 DIAGNOSIS — F39 Unspecified mood [affective] disorder: Secondary | ICD-10-CM | POA: Diagnosis not present

## 2020-07-13 DIAGNOSIS — Z20828 Contact with and (suspected) exposure to other viral communicable diseases: Secondary | ICD-10-CM | POA: Diagnosis not present

## 2020-07-13 DIAGNOSIS — F0391 Unspecified dementia with behavioral disturbance: Secondary | ICD-10-CM | POA: Diagnosis not present

## 2020-07-20 DIAGNOSIS — Z20828 Contact with and (suspected) exposure to other viral communicable diseases: Secondary | ICD-10-CM | POA: Diagnosis not present

## 2020-07-21 DIAGNOSIS — D649 Anemia, unspecified: Secondary | ICD-10-CM | POA: Diagnosis not present

## 2020-07-21 DIAGNOSIS — D510 Vitamin B12 deficiency anemia due to intrinsic factor deficiency: Secondary | ICD-10-CM | POA: Diagnosis not present

## 2020-07-26 DIAGNOSIS — F330 Major depressive disorder, recurrent, mild: Secondary | ICD-10-CM | POA: Diagnosis not present

## 2020-07-26 DIAGNOSIS — F419 Anxiety disorder, unspecified: Secondary | ICD-10-CM | POA: Diagnosis not present

## 2020-07-27 DIAGNOSIS — Z20828 Contact with and (suspected) exposure to other viral communicable diseases: Secondary | ICD-10-CM | POA: Diagnosis not present

## 2020-07-27 DIAGNOSIS — Z23 Encounter for immunization: Secondary | ICD-10-CM | POA: Diagnosis not present

## 2020-08-03 DIAGNOSIS — Z20828 Contact with and (suspected) exposure to other viral communicable diseases: Secondary | ICD-10-CM | POA: Diagnosis not present

## 2020-08-09 DIAGNOSIS — F419 Anxiety disorder, unspecified: Secondary | ICD-10-CM | POA: Diagnosis not present

## 2020-08-09 DIAGNOSIS — F330 Major depressive disorder, recurrent, mild: Secondary | ICD-10-CM | POA: Diagnosis not present

## 2020-08-10 DIAGNOSIS — Z20828 Contact with and (suspected) exposure to other viral communicable diseases: Secondary | ICD-10-CM | POA: Diagnosis not present

## 2020-08-23 DIAGNOSIS — B351 Tinea unguium: Secondary | ICD-10-CM | POA: Diagnosis not present

## 2020-08-23 DIAGNOSIS — M79673 Pain in unspecified foot: Secondary | ICD-10-CM | POA: Diagnosis not present

## 2020-08-24 DIAGNOSIS — F419 Anxiety disorder, unspecified: Secondary | ICD-10-CM | POA: Diagnosis not present

## 2020-08-24 DIAGNOSIS — F330 Major depressive disorder, recurrent, mild: Secondary | ICD-10-CM | POA: Diagnosis not present

## 2020-08-31 DIAGNOSIS — R269 Unspecified abnormalities of gait and mobility: Secondary | ICD-10-CM | POA: Diagnosis not present

## 2020-08-31 DIAGNOSIS — M6281 Muscle weakness (generalized): Secondary | ICD-10-CM | POA: Diagnosis not present

## 2020-08-31 DIAGNOSIS — S51811A Laceration without foreign body of right forearm, initial encounter: Secondary | ICD-10-CM | POA: Diagnosis not present

## 2020-08-31 DIAGNOSIS — F0390 Unspecified dementia without behavioral disturbance: Secondary | ICD-10-CM | POA: Diagnosis not present

## 2020-09-07 DIAGNOSIS — G894 Chronic pain syndrome: Secondary | ICD-10-CM | POA: Diagnosis not present

## 2020-09-07 DIAGNOSIS — M1990 Unspecified osteoarthritis, unspecified site: Secondary | ICD-10-CM | POA: Diagnosis not present

## 2020-09-07 DIAGNOSIS — M25512 Pain in left shoulder: Secondary | ICD-10-CM | POA: Diagnosis not present

## 2020-09-07 DIAGNOSIS — F0390 Unspecified dementia without behavioral disturbance: Secondary | ICD-10-CM | POA: Diagnosis not present

## 2020-09-08 DIAGNOSIS — M25512 Pain in left shoulder: Secondary | ICD-10-CM | POA: Diagnosis not present

## 2020-09-09 DIAGNOSIS — F419 Anxiety disorder, unspecified: Secondary | ICD-10-CM | POA: Diagnosis not present

## 2020-09-09 DIAGNOSIS — F330 Major depressive disorder, recurrent, mild: Secondary | ICD-10-CM | POA: Diagnosis not present

## 2020-09-14 DIAGNOSIS — F0391 Unspecified dementia with behavioral disturbance: Secondary | ICD-10-CM | POA: Diagnosis not present

## 2020-09-14 DIAGNOSIS — R451 Restlessness and agitation: Secondary | ICD-10-CM | POA: Diagnosis not present

## 2020-09-14 DIAGNOSIS — I129 Hypertensive chronic kidney disease with stage 1 through stage 4 chronic kidney disease, or unspecified chronic kidney disease: Secondary | ICD-10-CM | POA: Diagnosis not present

## 2020-09-14 DIAGNOSIS — I5032 Chronic diastolic (congestive) heart failure: Secondary | ICD-10-CM | POA: Diagnosis not present

## 2020-09-14 DIAGNOSIS — F419 Anxiety disorder, unspecified: Secondary | ICD-10-CM | POA: Diagnosis not present

## 2020-09-14 DIAGNOSIS — J449 Chronic obstructive pulmonary disease, unspecified: Secondary | ICD-10-CM | POA: Diagnosis not present

## 2020-09-14 DIAGNOSIS — F39 Unspecified mood [affective] disorder: Secondary | ICD-10-CM | POA: Diagnosis not present

## 2020-09-14 DIAGNOSIS — N1831 Chronic kidney disease, stage 3a: Secondary | ICD-10-CM | POA: Diagnosis not present

## 2020-09-17 DIAGNOSIS — W19XXXA Unspecified fall, initial encounter: Secondary | ICD-10-CM | POA: Diagnosis not present

## 2020-09-17 DIAGNOSIS — M79622 Pain in left upper arm: Secondary | ICD-10-CM | POA: Diagnosis not present

## 2020-09-17 DIAGNOSIS — M19012 Primary osteoarthritis, left shoulder: Secondary | ICD-10-CM | POA: Diagnosis not present

## 2020-09-19 DIAGNOSIS — G894 Chronic pain syndrome: Secondary | ICD-10-CM | POA: Diagnosis not present

## 2020-09-19 DIAGNOSIS — M24812 Other specific joint derangements of left shoulder, not elsewhere classified: Secondary | ICD-10-CM | POA: Diagnosis not present

## 2020-09-19 DIAGNOSIS — M25512 Pain in left shoulder: Secondary | ICD-10-CM | POA: Diagnosis not present

## 2020-09-19 DIAGNOSIS — M1990 Unspecified osteoarthritis, unspecified site: Secondary | ICD-10-CM | POA: Diagnosis not present

## 2020-09-21 DIAGNOSIS — F330 Major depressive disorder, recurrent, mild: Secondary | ICD-10-CM | POA: Diagnosis not present

## 2020-09-21 DIAGNOSIS — F0390 Unspecified dementia without behavioral disturbance: Secondary | ICD-10-CM | POA: Diagnosis not present

## 2020-09-21 DIAGNOSIS — M25512 Pain in left shoulder: Secondary | ICD-10-CM | POA: Diagnosis not present

## 2020-09-21 DIAGNOSIS — M1990 Unspecified osteoarthritis, unspecified site: Secondary | ICD-10-CM | POA: Diagnosis not present

## 2020-09-21 DIAGNOSIS — F419 Anxiety disorder, unspecified: Secondary | ICD-10-CM | POA: Diagnosis not present

## 2020-09-23 DIAGNOSIS — F0390 Unspecified dementia without behavioral disturbance: Secondary | ICD-10-CM | POA: Diagnosis not present

## 2020-09-23 DIAGNOSIS — M1990 Unspecified osteoarthritis, unspecified site: Secondary | ICD-10-CM | POA: Diagnosis not present

## 2020-09-23 DIAGNOSIS — M25512 Pain in left shoulder: Secondary | ICD-10-CM | POA: Diagnosis not present

## 2020-09-26 DIAGNOSIS — M25512 Pain in left shoulder: Secondary | ICD-10-CM | POA: Diagnosis not present

## 2020-09-26 DIAGNOSIS — I110 Hypertensive heart disease with heart failure: Secondary | ICD-10-CM | POA: Diagnosis not present

## 2020-09-26 DIAGNOSIS — I5032 Chronic diastolic (congestive) heart failure: Secondary | ICD-10-CM | POA: Diagnosis not present

## 2020-09-26 DIAGNOSIS — F0390 Unspecified dementia without behavioral disturbance: Secondary | ICD-10-CM | POA: Diagnosis not present

## 2020-09-28 DIAGNOSIS — M75122 Complete rotator cuff tear or rupture of left shoulder, not specified as traumatic: Secondary | ICD-10-CM | POA: Diagnosis not present

## 2020-10-14 DIAGNOSIS — F0391 Unspecified dementia with behavioral disturbance: Secondary | ICD-10-CM | POA: Diagnosis not present

## 2020-10-14 DIAGNOSIS — F39 Unspecified mood [affective] disorder: Secondary | ICD-10-CM | POA: Diagnosis not present

## 2020-10-14 DIAGNOSIS — R451 Restlessness and agitation: Secondary | ICD-10-CM | POA: Diagnosis not present

## 2020-10-14 DIAGNOSIS — F419 Anxiety disorder, unspecified: Secondary | ICD-10-CM | POA: Diagnosis not present

## 2020-10-18 DIAGNOSIS — Z20828 Contact with and (suspected) exposure to other viral communicable diseases: Secondary | ICD-10-CM | POA: Diagnosis not present

## 2020-10-19 DIAGNOSIS — F330 Major depressive disorder, recurrent, mild: Secondary | ICD-10-CM | POA: Diagnosis not present

## 2020-10-19 DIAGNOSIS — F419 Anxiety disorder, unspecified: Secondary | ICD-10-CM | POA: Diagnosis not present

## 2020-10-24 DIAGNOSIS — F0390 Unspecified dementia without behavioral disturbance: Secondary | ICD-10-CM | POA: Diagnosis not present

## 2020-10-24 DIAGNOSIS — R600 Localized edema: Secondary | ICD-10-CM | POA: Diagnosis not present

## 2020-10-24 DIAGNOSIS — I5032 Chronic diastolic (congestive) heart failure: Secondary | ICD-10-CM | POA: Diagnosis not present

## 2020-10-24 DIAGNOSIS — R635 Abnormal weight gain: Secondary | ICD-10-CM | POA: Diagnosis not present

## 2020-10-26 DIAGNOSIS — Z20828 Contact with and (suspected) exposure to other viral communicable diseases: Secondary | ICD-10-CM | POA: Diagnosis not present

## 2020-11-16 DIAGNOSIS — F0391 Unspecified dementia with behavioral disturbance: Secondary | ICD-10-CM | POA: Diagnosis not present

## 2020-11-16 DIAGNOSIS — F419 Anxiety disorder, unspecified: Secondary | ICD-10-CM | POA: Diagnosis not present

## 2020-11-16 DIAGNOSIS — F39 Unspecified mood [affective] disorder: Secondary | ICD-10-CM | POA: Diagnosis not present

## 2020-11-16 DIAGNOSIS — R451 Restlessness and agitation: Secondary | ICD-10-CM | POA: Diagnosis not present

## 2020-11-21 DIAGNOSIS — Z20828 Contact with and (suspected) exposure to other viral communicable diseases: Secondary | ICD-10-CM | POA: Diagnosis not present

## 2020-11-21 DIAGNOSIS — F330 Major depressive disorder, recurrent, mild: Secondary | ICD-10-CM | POA: Diagnosis not present

## 2020-11-21 DIAGNOSIS — F419 Anxiety disorder, unspecified: Secondary | ICD-10-CM | POA: Diagnosis not present

## 2020-11-22 DIAGNOSIS — B351 Tinea unguium: Secondary | ICD-10-CM | POA: Diagnosis not present

## 2020-11-22 DIAGNOSIS — L609 Nail disorder, unspecified: Secondary | ICD-10-CM | POA: Diagnosis not present

## 2020-11-24 DIAGNOSIS — R451 Restlessness and agitation: Secondary | ICD-10-CM | POA: Diagnosis not present

## 2020-11-24 DIAGNOSIS — F39 Unspecified mood [affective] disorder: Secondary | ICD-10-CM | POA: Diagnosis not present

## 2020-11-24 DIAGNOSIS — F419 Anxiety disorder, unspecified: Secondary | ICD-10-CM | POA: Diagnosis not present

## 2020-11-24 DIAGNOSIS — F0391 Unspecified dementia with behavioral disturbance: Secondary | ICD-10-CM | POA: Diagnosis not present

## 2020-11-29 DIAGNOSIS — Z20828 Contact with and (suspected) exposure to other viral communicable diseases: Secondary | ICD-10-CM | POA: Diagnosis not present

## 2020-11-30 DIAGNOSIS — R0989 Other specified symptoms and signs involving the circulatory and respiratory systems: Secondary | ICD-10-CM | POA: Diagnosis not present

## 2020-11-30 DIAGNOSIS — R0981 Nasal congestion: Secondary | ICD-10-CM | POA: Diagnosis not present

## 2020-11-30 DIAGNOSIS — R5381 Other malaise: Secondary | ICD-10-CM | POA: Diagnosis not present

## 2020-11-30 DIAGNOSIS — R059 Cough, unspecified: Secondary | ICD-10-CM | POA: Diagnosis not present

## 2020-11-30 DIAGNOSIS — J9811 Atelectasis: Secondary | ICD-10-CM | POA: Diagnosis not present

## 2020-11-30 DIAGNOSIS — J449 Chronic obstructive pulmonary disease, unspecified: Secondary | ICD-10-CM | POA: Diagnosis not present

## 2020-12-01 DIAGNOSIS — J449 Chronic obstructive pulmonary disease, unspecified: Secondary | ICD-10-CM | POA: Diagnosis not present

## 2020-12-01 DIAGNOSIS — R918 Other nonspecific abnormal finding of lung field: Secondary | ICD-10-CM | POA: Diagnosis not present

## 2020-12-01 DIAGNOSIS — R059 Cough, unspecified: Secondary | ICD-10-CM | POA: Diagnosis not present

## 2020-12-01 DIAGNOSIS — F0390 Unspecified dementia without behavioral disturbance: Secondary | ICD-10-CM | POA: Diagnosis not present

## 2020-12-06 DIAGNOSIS — Z20828 Contact with and (suspected) exposure to other viral communicable diseases: Secondary | ICD-10-CM | POA: Diagnosis not present

## 2020-12-13 DIAGNOSIS — F330 Major depressive disorder, recurrent, mild: Secondary | ICD-10-CM | POA: Diagnosis not present

## 2020-12-13 DIAGNOSIS — Z20828 Contact with and (suspected) exposure to other viral communicable diseases: Secondary | ICD-10-CM | POA: Diagnosis not present

## 2020-12-13 DIAGNOSIS — F419 Anxiety disorder, unspecified: Secondary | ICD-10-CM | POA: Diagnosis not present

## 2020-12-14 DIAGNOSIS — R451 Restlessness and agitation: Secondary | ICD-10-CM | POA: Diagnosis not present

## 2020-12-14 DIAGNOSIS — F0391 Unspecified dementia with behavioral disturbance: Secondary | ICD-10-CM | POA: Diagnosis not present

## 2020-12-14 DIAGNOSIS — F419 Anxiety disorder, unspecified: Secondary | ICD-10-CM | POA: Diagnosis not present

## 2020-12-14 DIAGNOSIS — F39 Unspecified mood [affective] disorder: Secondary | ICD-10-CM | POA: Diagnosis not present

## 2020-12-21 DIAGNOSIS — R634 Abnormal weight loss: Secondary | ICD-10-CM | POA: Diagnosis not present

## 2020-12-21 DIAGNOSIS — F0390 Unspecified dementia without behavioral disturbance: Secondary | ICD-10-CM | POA: Diagnosis not present

## 2020-12-21 DIAGNOSIS — Z20828 Contact with and (suspected) exposure to other viral communicable diseases: Secondary | ICD-10-CM | POA: Diagnosis not present

## 2020-12-21 DIAGNOSIS — I5032 Chronic diastolic (congestive) heart failure: Secondary | ICD-10-CM | POA: Diagnosis not present

## 2020-12-21 DIAGNOSIS — R600 Localized edema: Secondary | ICD-10-CM | POA: Diagnosis not present

## 2020-12-28 DIAGNOSIS — Z20828 Contact with and (suspected) exposure to other viral communicable diseases: Secondary | ICD-10-CM | POA: Diagnosis not present

## 2021-01-02 DIAGNOSIS — F419 Anxiety disorder, unspecified: Secondary | ICD-10-CM | POA: Diagnosis not present

## 2021-01-02 DIAGNOSIS — F330 Major depressive disorder, recurrent, mild: Secondary | ICD-10-CM | POA: Diagnosis not present

## 2021-01-11 DIAGNOSIS — F0391 Unspecified dementia with behavioral disturbance: Secondary | ICD-10-CM | POA: Diagnosis not present

## 2021-01-11 DIAGNOSIS — R451 Restlessness and agitation: Secondary | ICD-10-CM | POA: Diagnosis not present

## 2021-01-11 DIAGNOSIS — F39 Unspecified mood [affective] disorder: Secondary | ICD-10-CM | POA: Diagnosis not present

## 2021-01-11 DIAGNOSIS — F419 Anxiety disorder, unspecified: Secondary | ICD-10-CM | POA: Diagnosis not present

## 2021-01-23 DIAGNOSIS — F419 Anxiety disorder, unspecified: Secondary | ICD-10-CM | POA: Diagnosis not present

## 2021-01-23 DIAGNOSIS — F330 Major depressive disorder, recurrent, mild: Secondary | ICD-10-CM | POA: Diagnosis not present

## 2021-02-01 DIAGNOSIS — H5789 Other specified disorders of eye and adnexa: Secondary | ICD-10-CM | POA: Diagnosis not present

## 2021-02-01 DIAGNOSIS — H00013 Hordeolum externum right eye, unspecified eyelid: Secondary | ICD-10-CM | POA: Diagnosis not present

## 2021-02-01 DIAGNOSIS — F0390 Unspecified dementia without behavioral disturbance: Secondary | ICD-10-CM | POA: Diagnosis not present

## 2021-02-08 DIAGNOSIS — N1831 Chronic kidney disease, stage 3a: Secondary | ICD-10-CM | POA: Diagnosis not present

## 2021-02-08 DIAGNOSIS — I2510 Atherosclerotic heart disease of native coronary artery without angina pectoris: Secondary | ICD-10-CM | POA: Diagnosis not present

## 2021-02-08 DIAGNOSIS — I129 Hypertensive chronic kidney disease with stage 1 through stage 4 chronic kidney disease, or unspecified chronic kidney disease: Secondary | ICD-10-CM | POA: Diagnosis not present

## 2021-02-08 DIAGNOSIS — R600 Localized edema: Secondary | ICD-10-CM | POA: Diagnosis not present

## 2021-02-09 DIAGNOSIS — F0390 Unspecified dementia without behavioral disturbance: Secondary | ICD-10-CM | POA: Diagnosis not present

## 2021-02-09 DIAGNOSIS — B029 Zoster without complications: Secondary | ICD-10-CM | POA: Diagnosis not present

## 2021-02-09 DIAGNOSIS — R21 Rash and other nonspecific skin eruption: Secondary | ICD-10-CM | POA: Diagnosis not present

## 2021-02-09 DIAGNOSIS — R54 Age-related physical debility: Secondary | ICD-10-CM | POA: Diagnosis not present

## 2021-02-15 DIAGNOSIS — F39 Unspecified mood [affective] disorder: Secondary | ICD-10-CM | POA: Diagnosis not present

## 2021-02-15 DIAGNOSIS — F419 Anxiety disorder, unspecified: Secondary | ICD-10-CM | POA: Diagnosis not present

## 2021-02-15 DIAGNOSIS — R451 Restlessness and agitation: Secondary | ICD-10-CM | POA: Diagnosis not present

## 2021-02-15 DIAGNOSIS — F0390 Unspecified dementia without behavioral disturbance: Secondary | ICD-10-CM | POA: Diagnosis not present

## 2021-02-15 DIAGNOSIS — B029 Zoster without complications: Secondary | ICD-10-CM | POA: Diagnosis not present

## 2021-02-15 DIAGNOSIS — L299 Pruritus, unspecified: Secondary | ICD-10-CM | POA: Diagnosis not present

## 2021-02-15 DIAGNOSIS — F0391 Unspecified dementia with behavioral disturbance: Secondary | ICD-10-CM | POA: Diagnosis not present

## 2021-02-21 DIAGNOSIS — F330 Major depressive disorder, recurrent, mild: Secondary | ICD-10-CM | POA: Diagnosis not present

## 2021-02-21 DIAGNOSIS — F419 Anxiety disorder, unspecified: Secondary | ICD-10-CM | POA: Diagnosis not present

## 2021-03-07 DIAGNOSIS — Z20828 Contact with and (suspected) exposure to other viral communicable diseases: Secondary | ICD-10-CM | POA: Diagnosis not present

## 2021-03-13 DIAGNOSIS — F330 Major depressive disorder, recurrent, mild: Secondary | ICD-10-CM | POA: Diagnosis not present

## 2021-03-13 DIAGNOSIS — F419 Anxiety disorder, unspecified: Secondary | ICD-10-CM | POA: Diagnosis not present

## 2021-03-13 DIAGNOSIS — Z20828 Contact with and (suspected) exposure to other viral communicable diseases: Secondary | ICD-10-CM | POA: Diagnosis not present

## 2021-03-15 DIAGNOSIS — F39 Unspecified mood [affective] disorder: Secondary | ICD-10-CM | POA: Diagnosis not present

## 2021-03-15 DIAGNOSIS — F419 Anxiety disorder, unspecified: Secondary | ICD-10-CM | POA: Diagnosis not present

## 2021-03-15 DIAGNOSIS — F0391 Unspecified dementia with behavioral disturbance: Secondary | ICD-10-CM | POA: Diagnosis not present

## 2021-03-16 DIAGNOSIS — R059 Cough, unspecified: Secondary | ICD-10-CM | POA: Diagnosis not present

## 2021-03-16 DIAGNOSIS — I959 Hypotension, unspecified: Secondary | ICD-10-CM | POA: Diagnosis not present

## 2021-03-16 DIAGNOSIS — I130 Hypertensive heart and chronic kidney disease with heart failure and stage 1 through stage 4 chronic kidney disease, or unspecified chronic kidney disease: Secondary | ICD-10-CM | POA: Diagnosis not present

## 2021-03-16 DIAGNOSIS — R079 Chest pain, unspecified: Secondary | ICD-10-CM | POA: Diagnosis not present

## 2021-03-16 DIAGNOSIS — I5030 Unspecified diastolic (congestive) heart failure: Secondary | ICD-10-CM | POA: Diagnosis not present

## 2021-03-16 DIAGNOSIS — J90 Pleural effusion, not elsewhere classified: Secondary | ICD-10-CM | POA: Diagnosis not present

## 2021-03-16 DIAGNOSIS — R0602 Shortness of breath: Secondary | ICD-10-CM | POA: Diagnosis not present

## 2021-03-16 DIAGNOSIS — I110 Hypertensive heart disease with heart failure: Secondary | ICD-10-CM | POA: Diagnosis not present

## 2021-03-16 DIAGNOSIS — I517 Cardiomegaly: Secondary | ICD-10-CM | POA: Diagnosis not present

## 2021-03-16 DIAGNOSIS — J189 Pneumonia, unspecified organism: Secondary | ICD-10-CM | POA: Diagnosis not present

## 2021-03-16 DIAGNOSIS — I509 Heart failure, unspecified: Secondary | ICD-10-CM | POA: Diagnosis not present

## 2021-03-16 DIAGNOSIS — I361 Nonrheumatic tricuspid (valve) insufficiency: Secondary | ICD-10-CM | POA: Diagnosis not present

## 2021-03-16 DIAGNOSIS — I5033 Acute on chronic diastolic (congestive) heart failure: Secondary | ICD-10-CM | POA: Diagnosis not present

## 2021-03-16 DIAGNOSIS — I340 Nonrheumatic mitral (valve) insufficiency: Secondary | ICD-10-CM | POA: Diagnosis not present

## 2021-03-16 DIAGNOSIS — R0789 Other chest pain: Secondary | ICD-10-CM | POA: Diagnosis not present

## 2021-03-16 DIAGNOSIS — K449 Diaphragmatic hernia without obstruction or gangrene: Secondary | ICD-10-CM | POA: Diagnosis not present

## 2021-03-17 DIAGNOSIS — I5033 Acute on chronic diastolic (congestive) heart failure: Secondary | ICD-10-CM | POA: Diagnosis not present

## 2021-03-17 DIAGNOSIS — J189 Pneumonia, unspecified organism: Secondary | ICD-10-CM | POA: Diagnosis not present

## 2021-03-17 DIAGNOSIS — I130 Hypertensive heart and chronic kidney disease with heart failure and stage 1 through stage 4 chronic kidney disease, or unspecified chronic kidney disease: Secondary | ICD-10-CM | POA: Diagnosis not present

## 2021-03-18 DIAGNOSIS — I509 Heart failure, unspecified: Secondary | ICD-10-CM | POA: Diagnosis not present

## 2021-03-18 DIAGNOSIS — I130 Hypertensive heart and chronic kidney disease with heart failure and stage 1 through stage 4 chronic kidney disease, or unspecified chronic kidney disease: Secondary | ICD-10-CM | POA: Diagnosis not present

## 2021-03-18 DIAGNOSIS — I5033 Acute on chronic diastolic (congestive) heart failure: Secondary | ICD-10-CM | POA: Diagnosis not present

## 2021-03-18 DIAGNOSIS — J189 Pneumonia, unspecified organism: Secondary | ICD-10-CM | POA: Diagnosis not present

## 2021-03-18 DIAGNOSIS — I517 Cardiomegaly: Secondary | ICD-10-CM | POA: Diagnosis not present

## 2021-03-20 DIAGNOSIS — Z20828 Contact with and (suspected) exposure to other viral communicable diseases: Secondary | ICD-10-CM | POA: Diagnosis not present

## 2021-03-21 DIAGNOSIS — N183 Chronic kidney disease, stage 3 unspecified: Secondary | ICD-10-CM | POA: Diagnosis not present

## 2021-03-21 DIAGNOSIS — J189 Pneumonia, unspecified organism: Secondary | ICD-10-CM | POA: Diagnosis not present

## 2021-03-21 DIAGNOSIS — J449 Chronic obstructive pulmonary disease, unspecified: Secondary | ICD-10-CM | POA: Diagnosis not present

## 2021-03-21 DIAGNOSIS — I5033 Acute on chronic diastolic (congestive) heart failure: Secondary | ICD-10-CM | POA: Diagnosis not present

## 2021-03-23 DIAGNOSIS — J449 Chronic obstructive pulmonary disease, unspecified: Secondary | ICD-10-CM | POA: Diagnosis not present

## 2021-03-23 DIAGNOSIS — R011 Cardiac murmur, unspecified: Secondary | ICD-10-CM | POA: Diagnosis not present

## 2021-03-23 DIAGNOSIS — N1831 Chronic kidney disease, stage 3a: Secondary | ICD-10-CM | POA: Diagnosis not present

## 2021-03-23 DIAGNOSIS — R600 Localized edema: Secondary | ICD-10-CM | POA: Diagnosis not present

## 2021-03-27 DIAGNOSIS — Z20828 Contact with and (suspected) exposure to other viral communicable diseases: Secondary | ICD-10-CM | POA: Diagnosis not present

## 2021-03-29 DIAGNOSIS — R062 Wheezing: Secondary | ICD-10-CM | POA: Diagnosis not present

## 2021-03-29 DIAGNOSIS — I5032 Chronic diastolic (congestive) heart failure: Secondary | ICD-10-CM | POA: Diagnosis not present

## 2021-03-29 DIAGNOSIS — R059 Cough, unspecified: Secondary | ICD-10-CM | POA: Diagnosis not present

## 2021-03-29 DIAGNOSIS — J449 Chronic obstructive pulmonary disease, unspecified: Secondary | ICD-10-CM | POA: Diagnosis not present

## 2021-03-30 DIAGNOSIS — N390 Urinary tract infection, site not specified: Secondary | ICD-10-CM | POA: Diagnosis not present

## 2021-04-03 DIAGNOSIS — Z20828 Contact with and (suspected) exposure to other viral communicable diseases: Secondary | ICD-10-CM | POA: Diagnosis not present

## 2021-04-05 DIAGNOSIS — R0989 Other specified symptoms and signs involving the circulatory and respiratory systems: Secondary | ICD-10-CM | POA: Diagnosis not present

## 2021-04-05 DIAGNOSIS — F0390 Unspecified dementia without behavioral disturbance: Secondary | ICD-10-CM | POA: Diagnosis not present

## 2021-04-05 DIAGNOSIS — I5032 Chronic diastolic (congestive) heart failure: Secondary | ICD-10-CM | POA: Diagnosis not present

## 2021-04-05 DIAGNOSIS — R059 Cough, unspecified: Secondary | ICD-10-CM | POA: Diagnosis not present

## 2021-04-11 DIAGNOSIS — Z20828 Contact with and (suspected) exposure to other viral communicable diseases: Secondary | ICD-10-CM | POA: Diagnosis not present

## 2021-04-12 DIAGNOSIS — R269 Unspecified abnormalities of gait and mobility: Secondary | ICD-10-CM | POA: Diagnosis not present

## 2021-04-12 DIAGNOSIS — M6281 Muscle weakness (generalized): Secondary | ICD-10-CM | POA: Diagnosis not present

## 2021-04-12 DIAGNOSIS — F39 Unspecified mood [affective] disorder: Secondary | ICD-10-CM | POA: Diagnosis not present

## 2021-04-12 DIAGNOSIS — I5032 Chronic diastolic (congestive) heart failure: Secondary | ICD-10-CM | POA: Diagnosis not present

## 2021-04-12 DIAGNOSIS — R600 Localized edema: Secondary | ICD-10-CM | POA: Diagnosis not present

## 2021-04-12 DIAGNOSIS — F419 Anxiety disorder, unspecified: Secondary | ICD-10-CM | POA: Diagnosis not present

## 2021-04-12 DIAGNOSIS — F0391 Unspecified dementia with behavioral disturbance: Secondary | ICD-10-CM | POA: Diagnosis not present

## 2021-04-15 DIAGNOSIS — F32A Depression, unspecified: Secondary | ICD-10-CM | POA: Diagnosis not present

## 2021-04-15 DIAGNOSIS — Z7902 Long term (current) use of antithrombotics/antiplatelets: Secondary | ICD-10-CM | POA: Diagnosis not present

## 2021-04-15 DIAGNOSIS — N179 Acute kidney failure, unspecified: Secondary | ICD-10-CM | POA: Diagnosis not present

## 2021-04-15 DIAGNOSIS — F419 Anxiety disorder, unspecified: Secondary | ICD-10-CM | POA: Diagnosis not present

## 2021-04-15 DIAGNOSIS — J189 Pneumonia, unspecified organism: Secondary | ICD-10-CM | POA: Diagnosis not present

## 2021-04-15 DIAGNOSIS — Z9981 Dependence on supplemental oxygen: Secondary | ICD-10-CM | POA: Diagnosis not present

## 2021-04-15 DIAGNOSIS — J440 Chronic obstructive pulmonary disease with acute lower respiratory infection: Secondary | ICD-10-CM | POA: Diagnosis not present

## 2021-04-15 DIAGNOSIS — I5032 Chronic diastolic (congestive) heart failure: Secondary | ICD-10-CM | POA: Diagnosis not present

## 2021-04-15 DIAGNOSIS — F0391 Unspecified dementia with behavioral disturbance: Secondary | ICD-10-CM | POA: Diagnosis not present

## 2021-04-15 DIAGNOSIS — I2510 Atherosclerotic heart disease of native coronary artery without angina pectoris: Secondary | ICD-10-CM | POA: Diagnosis not present

## 2021-04-15 DIAGNOSIS — Z7951 Long term (current) use of inhaled steroids: Secondary | ICD-10-CM | POA: Diagnosis not present

## 2021-04-15 DIAGNOSIS — D649 Anemia, unspecified: Secondary | ICD-10-CM | POA: Diagnosis not present

## 2021-04-15 DIAGNOSIS — E785 Hyperlipidemia, unspecified: Secondary | ICD-10-CM | POA: Diagnosis not present

## 2021-04-17 DIAGNOSIS — R609 Edema, unspecified: Secondary | ICD-10-CM | POA: Diagnosis not present

## 2021-04-17 DIAGNOSIS — E785 Hyperlipidemia, unspecified: Secondary | ICD-10-CM | POA: Diagnosis not present

## 2021-04-17 DIAGNOSIS — I2510 Atherosclerotic heart disease of native coronary artery without angina pectoris: Secondary | ICD-10-CM | POA: Diagnosis not present

## 2021-04-18 DIAGNOSIS — E785 Hyperlipidemia, unspecified: Secondary | ICD-10-CM | POA: Diagnosis not present

## 2021-04-18 DIAGNOSIS — F0391 Unspecified dementia with behavioral disturbance: Secondary | ICD-10-CM | POA: Diagnosis not present

## 2021-04-18 DIAGNOSIS — Z7902 Long term (current) use of antithrombotics/antiplatelets: Secondary | ICD-10-CM | POA: Diagnosis not present

## 2021-04-18 DIAGNOSIS — I509 Heart failure, unspecified: Secondary | ICD-10-CM | POA: Diagnosis not present

## 2021-04-18 DIAGNOSIS — F419 Anxiety disorder, unspecified: Secondary | ICD-10-CM | POA: Diagnosis not present

## 2021-04-18 DIAGNOSIS — Z20828 Contact with and (suspected) exposure to other viral communicable diseases: Secondary | ICD-10-CM | POA: Diagnosis not present

## 2021-04-18 DIAGNOSIS — N179 Acute kidney failure, unspecified: Secondary | ICD-10-CM | POA: Diagnosis not present

## 2021-04-18 DIAGNOSIS — R609 Edema, unspecified: Secondary | ICD-10-CM | POA: Diagnosis not present

## 2021-04-18 DIAGNOSIS — D649 Anemia, unspecified: Secondary | ICD-10-CM | POA: Diagnosis not present

## 2021-04-18 DIAGNOSIS — J189 Pneumonia, unspecified organism: Secondary | ICD-10-CM | POA: Diagnosis not present

## 2021-04-18 DIAGNOSIS — Z9981 Dependence on supplemental oxygen: Secondary | ICD-10-CM | POA: Diagnosis not present

## 2021-04-18 DIAGNOSIS — F32A Depression, unspecified: Secondary | ICD-10-CM | POA: Diagnosis not present

## 2021-04-18 DIAGNOSIS — I5032 Chronic diastolic (congestive) heart failure: Secondary | ICD-10-CM | POA: Diagnosis not present

## 2021-04-18 DIAGNOSIS — I2510 Atherosclerotic heart disease of native coronary artery without angina pectoris: Secondary | ICD-10-CM | POA: Diagnosis not present

## 2021-04-18 DIAGNOSIS — Z7951 Long term (current) use of inhaled steroids: Secondary | ICD-10-CM | POA: Diagnosis not present

## 2021-04-18 DIAGNOSIS — J440 Chronic obstructive pulmonary disease with acute lower respiratory infection: Secondary | ICD-10-CM | POA: Diagnosis not present

## 2021-04-20 DIAGNOSIS — J189 Pneumonia, unspecified organism: Secondary | ICD-10-CM | POA: Diagnosis not present

## 2021-04-20 DIAGNOSIS — E785 Hyperlipidemia, unspecified: Secondary | ICD-10-CM | POA: Diagnosis not present

## 2021-04-20 DIAGNOSIS — D649 Anemia, unspecified: Secondary | ICD-10-CM | POA: Diagnosis not present

## 2021-04-20 DIAGNOSIS — Z7951 Long term (current) use of inhaled steroids: Secondary | ICD-10-CM | POA: Diagnosis not present

## 2021-04-20 DIAGNOSIS — F32A Depression, unspecified: Secondary | ICD-10-CM | POA: Diagnosis not present

## 2021-04-20 DIAGNOSIS — N179 Acute kidney failure, unspecified: Secondary | ICD-10-CM | POA: Diagnosis not present

## 2021-04-20 DIAGNOSIS — I2510 Atherosclerotic heart disease of native coronary artery without angina pectoris: Secondary | ICD-10-CM | POA: Diagnosis not present

## 2021-04-20 DIAGNOSIS — I5032 Chronic diastolic (congestive) heart failure: Secondary | ICD-10-CM | POA: Diagnosis not present

## 2021-04-20 DIAGNOSIS — Z9981 Dependence on supplemental oxygen: Secondary | ICD-10-CM | POA: Diagnosis not present

## 2021-04-20 DIAGNOSIS — F0391 Unspecified dementia with behavioral disturbance: Secondary | ICD-10-CM | POA: Diagnosis not present

## 2021-04-20 DIAGNOSIS — J440 Chronic obstructive pulmonary disease with acute lower respiratory infection: Secondary | ICD-10-CM | POA: Diagnosis not present

## 2021-04-20 DIAGNOSIS — Z7902 Long term (current) use of antithrombotics/antiplatelets: Secondary | ICD-10-CM | POA: Diagnosis not present

## 2021-04-20 DIAGNOSIS — F419 Anxiety disorder, unspecified: Secondary | ICD-10-CM | POA: Diagnosis not present

## 2021-04-24 DIAGNOSIS — J189 Pneumonia, unspecified organism: Secondary | ICD-10-CM | POA: Diagnosis not present

## 2021-04-24 DIAGNOSIS — F0391 Unspecified dementia with behavioral disturbance: Secondary | ICD-10-CM | POA: Diagnosis not present

## 2021-04-24 DIAGNOSIS — F419 Anxiety disorder, unspecified: Secondary | ICD-10-CM | POA: Diagnosis not present

## 2021-04-24 DIAGNOSIS — Z7902 Long term (current) use of antithrombotics/antiplatelets: Secondary | ICD-10-CM | POA: Diagnosis not present

## 2021-04-24 DIAGNOSIS — E785 Hyperlipidemia, unspecified: Secondary | ICD-10-CM | POA: Diagnosis not present

## 2021-04-24 DIAGNOSIS — I5032 Chronic diastolic (congestive) heart failure: Secondary | ICD-10-CM | POA: Diagnosis not present

## 2021-04-24 DIAGNOSIS — N179 Acute kidney failure, unspecified: Secondary | ICD-10-CM | POA: Diagnosis not present

## 2021-04-24 DIAGNOSIS — J440 Chronic obstructive pulmonary disease with acute lower respiratory infection: Secondary | ICD-10-CM | POA: Diagnosis not present

## 2021-04-24 DIAGNOSIS — Z7951 Long term (current) use of inhaled steroids: Secondary | ICD-10-CM | POA: Diagnosis not present

## 2021-04-24 DIAGNOSIS — D649 Anemia, unspecified: Secondary | ICD-10-CM | POA: Diagnosis not present

## 2021-04-24 DIAGNOSIS — F32A Depression, unspecified: Secondary | ICD-10-CM | POA: Diagnosis not present

## 2021-04-24 DIAGNOSIS — Z9981 Dependence on supplemental oxygen: Secondary | ICD-10-CM | POA: Diagnosis not present

## 2021-04-24 DIAGNOSIS — I2510 Atherosclerotic heart disease of native coronary artery without angina pectoris: Secondary | ICD-10-CM | POA: Diagnosis not present

## 2021-04-27 DIAGNOSIS — I5032 Chronic diastolic (congestive) heart failure: Secondary | ICD-10-CM | POA: Diagnosis not present

## 2021-04-27 DIAGNOSIS — J189 Pneumonia, unspecified organism: Secondary | ICD-10-CM | POA: Diagnosis not present

## 2021-04-27 DIAGNOSIS — F0391 Unspecified dementia with behavioral disturbance: Secondary | ICD-10-CM | POA: Diagnosis not present

## 2021-04-27 DIAGNOSIS — D649 Anemia, unspecified: Secondary | ICD-10-CM | POA: Diagnosis not present

## 2021-04-27 DIAGNOSIS — J440 Chronic obstructive pulmonary disease with acute lower respiratory infection: Secondary | ICD-10-CM | POA: Diagnosis not present

## 2021-04-27 DIAGNOSIS — E785 Hyperlipidemia, unspecified: Secondary | ICD-10-CM | POA: Diagnosis not present

## 2021-04-27 DIAGNOSIS — Z7951 Long term (current) use of inhaled steroids: Secondary | ICD-10-CM | POA: Diagnosis not present

## 2021-04-27 DIAGNOSIS — N179 Acute kidney failure, unspecified: Secondary | ICD-10-CM | POA: Diagnosis not present

## 2021-04-27 DIAGNOSIS — Z9981 Dependence on supplemental oxygen: Secondary | ICD-10-CM | POA: Diagnosis not present

## 2021-04-27 DIAGNOSIS — F32A Depression, unspecified: Secondary | ICD-10-CM | POA: Diagnosis not present

## 2021-04-27 DIAGNOSIS — F419 Anxiety disorder, unspecified: Secondary | ICD-10-CM | POA: Diagnosis not present

## 2021-04-27 DIAGNOSIS — Z7902 Long term (current) use of antithrombotics/antiplatelets: Secondary | ICD-10-CM | POA: Diagnosis not present

## 2021-04-27 DIAGNOSIS — I2510 Atherosclerotic heart disease of native coronary artery without angina pectoris: Secondary | ICD-10-CM | POA: Diagnosis not present

## 2021-05-01 DIAGNOSIS — F419 Anxiety disorder, unspecified: Secondary | ICD-10-CM | POA: Diagnosis not present

## 2021-05-01 DIAGNOSIS — F32A Depression, unspecified: Secondary | ICD-10-CM | POA: Diagnosis not present

## 2021-05-01 DIAGNOSIS — J189 Pneumonia, unspecified organism: Secondary | ICD-10-CM | POA: Diagnosis not present

## 2021-05-01 DIAGNOSIS — I5032 Chronic diastolic (congestive) heart failure: Secondary | ICD-10-CM | POA: Diagnosis not present

## 2021-05-01 DIAGNOSIS — Z7902 Long term (current) use of antithrombotics/antiplatelets: Secondary | ICD-10-CM | POA: Diagnosis not present

## 2021-05-01 DIAGNOSIS — E785 Hyperlipidemia, unspecified: Secondary | ICD-10-CM | POA: Diagnosis not present

## 2021-05-01 DIAGNOSIS — Z7951 Long term (current) use of inhaled steroids: Secondary | ICD-10-CM | POA: Diagnosis not present

## 2021-05-01 DIAGNOSIS — Z9981 Dependence on supplemental oxygen: Secondary | ICD-10-CM | POA: Diagnosis not present

## 2021-05-01 DIAGNOSIS — I2510 Atherosclerotic heart disease of native coronary artery without angina pectoris: Secondary | ICD-10-CM | POA: Diagnosis not present

## 2021-05-01 DIAGNOSIS — F0391 Unspecified dementia with behavioral disturbance: Secondary | ICD-10-CM | POA: Diagnosis not present

## 2021-05-01 DIAGNOSIS — J440 Chronic obstructive pulmonary disease with acute lower respiratory infection: Secondary | ICD-10-CM | POA: Diagnosis not present

## 2021-05-01 DIAGNOSIS — N179 Acute kidney failure, unspecified: Secondary | ICD-10-CM | POA: Diagnosis not present

## 2021-05-01 DIAGNOSIS — D649 Anemia, unspecified: Secondary | ICD-10-CM | POA: Diagnosis not present

## 2021-05-04 DIAGNOSIS — F39 Unspecified mood [affective] disorder: Secondary | ICD-10-CM | POA: Diagnosis not present

## 2021-05-04 DIAGNOSIS — Z9981 Dependence on supplemental oxygen: Secondary | ICD-10-CM | POA: Diagnosis not present

## 2021-05-04 DIAGNOSIS — E785 Hyperlipidemia, unspecified: Secondary | ICD-10-CM | POA: Diagnosis not present

## 2021-05-04 DIAGNOSIS — F419 Anxiety disorder, unspecified: Secondary | ICD-10-CM | POA: Diagnosis not present

## 2021-05-04 DIAGNOSIS — I5032 Chronic diastolic (congestive) heart failure: Secondary | ICD-10-CM | POA: Diagnosis not present

## 2021-05-04 DIAGNOSIS — F32A Depression, unspecified: Secondary | ICD-10-CM | POA: Diagnosis not present

## 2021-05-04 DIAGNOSIS — D649 Anemia, unspecified: Secondary | ICD-10-CM | POA: Diagnosis not present

## 2021-05-04 DIAGNOSIS — J189 Pneumonia, unspecified organism: Secondary | ICD-10-CM | POA: Diagnosis not present

## 2021-05-04 DIAGNOSIS — Z7902 Long term (current) use of antithrombotics/antiplatelets: Secondary | ICD-10-CM | POA: Diagnosis not present

## 2021-05-04 DIAGNOSIS — F0391 Unspecified dementia with behavioral disturbance: Secondary | ICD-10-CM | POA: Diagnosis not present

## 2021-05-04 DIAGNOSIS — N179 Acute kidney failure, unspecified: Secondary | ICD-10-CM | POA: Diagnosis not present

## 2021-05-04 DIAGNOSIS — J440 Chronic obstructive pulmonary disease with acute lower respiratory infection: Secondary | ICD-10-CM | POA: Diagnosis not present

## 2021-05-04 DIAGNOSIS — I2510 Atherosclerotic heart disease of native coronary artery without angina pectoris: Secondary | ICD-10-CM | POA: Diagnosis not present

## 2021-05-04 DIAGNOSIS — Z7951 Long term (current) use of inhaled steroids: Secondary | ICD-10-CM | POA: Diagnosis not present

## 2021-05-04 DIAGNOSIS — F330 Major depressive disorder, recurrent, mild: Secondary | ICD-10-CM | POA: Diagnosis not present

## 2021-05-08 DIAGNOSIS — F0391 Unspecified dementia with behavioral disturbance: Secondary | ICD-10-CM | POA: Diagnosis not present

## 2021-05-08 DIAGNOSIS — I2510 Atherosclerotic heart disease of native coronary artery without angina pectoris: Secondary | ICD-10-CM | POA: Diagnosis not present

## 2021-05-08 DIAGNOSIS — J189 Pneumonia, unspecified organism: Secondary | ICD-10-CM | POA: Diagnosis not present

## 2021-05-08 DIAGNOSIS — D649 Anemia, unspecified: Secondary | ICD-10-CM | POA: Diagnosis not present

## 2021-05-08 DIAGNOSIS — I5032 Chronic diastolic (congestive) heart failure: Secondary | ICD-10-CM | POA: Diagnosis not present

## 2021-05-08 DIAGNOSIS — Z9981 Dependence on supplemental oxygen: Secondary | ICD-10-CM | POA: Diagnosis not present

## 2021-05-08 DIAGNOSIS — F419 Anxiety disorder, unspecified: Secondary | ICD-10-CM | POA: Diagnosis not present

## 2021-05-08 DIAGNOSIS — J440 Chronic obstructive pulmonary disease with acute lower respiratory infection: Secondary | ICD-10-CM | POA: Diagnosis not present

## 2021-05-08 DIAGNOSIS — F32A Depression, unspecified: Secondary | ICD-10-CM | POA: Diagnosis not present

## 2021-05-08 DIAGNOSIS — Z7951 Long term (current) use of inhaled steroids: Secondary | ICD-10-CM | POA: Diagnosis not present

## 2021-05-08 DIAGNOSIS — N179 Acute kidney failure, unspecified: Secondary | ICD-10-CM | POA: Diagnosis not present

## 2021-05-08 DIAGNOSIS — Z7902 Long term (current) use of antithrombotics/antiplatelets: Secondary | ICD-10-CM | POA: Diagnosis not present

## 2021-05-08 DIAGNOSIS — E785 Hyperlipidemia, unspecified: Secondary | ICD-10-CM | POA: Diagnosis not present

## 2021-05-17 DIAGNOSIS — F39 Unspecified mood [affective] disorder: Secondary | ICD-10-CM | POA: Diagnosis not present

## 2021-05-17 DIAGNOSIS — F0391 Unspecified dementia with behavioral disturbance: Secondary | ICD-10-CM | POA: Diagnosis not present

## 2021-05-17 DIAGNOSIS — F419 Anxiety disorder, unspecified: Secondary | ICD-10-CM | POA: Diagnosis not present

## 2021-05-19 DIAGNOSIS — N179 Acute kidney failure, unspecified: Secondary | ICD-10-CM | POA: Diagnosis not present

## 2021-05-19 DIAGNOSIS — I5032 Chronic diastolic (congestive) heart failure: Secondary | ICD-10-CM | POA: Diagnosis not present

## 2021-05-19 DIAGNOSIS — F32A Depression, unspecified: Secondary | ICD-10-CM | POA: Diagnosis not present

## 2021-05-19 DIAGNOSIS — Z9981 Dependence on supplemental oxygen: Secondary | ICD-10-CM | POA: Diagnosis not present

## 2021-05-19 DIAGNOSIS — J440 Chronic obstructive pulmonary disease with acute lower respiratory infection: Secondary | ICD-10-CM | POA: Diagnosis not present

## 2021-05-19 DIAGNOSIS — E785 Hyperlipidemia, unspecified: Secondary | ICD-10-CM | POA: Diagnosis not present

## 2021-05-19 DIAGNOSIS — F419 Anxiety disorder, unspecified: Secondary | ICD-10-CM | POA: Diagnosis not present

## 2021-05-19 DIAGNOSIS — I2510 Atherosclerotic heart disease of native coronary artery without angina pectoris: Secondary | ICD-10-CM | POA: Diagnosis not present

## 2021-05-19 DIAGNOSIS — J189 Pneumonia, unspecified organism: Secondary | ICD-10-CM | POA: Diagnosis not present

## 2021-05-19 DIAGNOSIS — Z7951 Long term (current) use of inhaled steroids: Secondary | ICD-10-CM | POA: Diagnosis not present

## 2021-05-19 DIAGNOSIS — Z7902 Long term (current) use of antithrombotics/antiplatelets: Secondary | ICD-10-CM | POA: Diagnosis not present

## 2021-05-19 DIAGNOSIS — D649 Anemia, unspecified: Secondary | ICD-10-CM | POA: Diagnosis not present

## 2021-05-19 DIAGNOSIS — F0391 Unspecified dementia with behavioral disturbance: Secondary | ICD-10-CM | POA: Diagnosis not present

## 2021-05-26 DIAGNOSIS — I5032 Chronic diastolic (congestive) heart failure: Secondary | ICD-10-CM | POA: Diagnosis not present

## 2021-05-26 DIAGNOSIS — J440 Chronic obstructive pulmonary disease with acute lower respiratory infection: Secondary | ICD-10-CM | POA: Diagnosis not present

## 2021-05-26 DIAGNOSIS — Z9981 Dependence on supplemental oxygen: Secondary | ICD-10-CM | POA: Diagnosis not present

## 2021-05-26 DIAGNOSIS — F32A Depression, unspecified: Secondary | ICD-10-CM | POA: Diagnosis not present

## 2021-05-26 DIAGNOSIS — F419 Anxiety disorder, unspecified: Secondary | ICD-10-CM | POA: Diagnosis not present

## 2021-05-26 DIAGNOSIS — E785 Hyperlipidemia, unspecified: Secondary | ICD-10-CM | POA: Diagnosis not present

## 2021-05-26 DIAGNOSIS — F0391 Unspecified dementia with behavioral disturbance: Secondary | ICD-10-CM | POA: Diagnosis not present

## 2021-05-26 DIAGNOSIS — D649 Anemia, unspecified: Secondary | ICD-10-CM | POA: Diagnosis not present

## 2021-05-26 DIAGNOSIS — N179 Acute kidney failure, unspecified: Secondary | ICD-10-CM | POA: Diagnosis not present

## 2021-05-26 DIAGNOSIS — I2510 Atherosclerotic heart disease of native coronary artery without angina pectoris: Secondary | ICD-10-CM | POA: Diagnosis not present

## 2021-05-26 DIAGNOSIS — Z7902 Long term (current) use of antithrombotics/antiplatelets: Secondary | ICD-10-CM | POA: Diagnosis not present

## 2021-05-26 DIAGNOSIS — J189 Pneumonia, unspecified organism: Secondary | ICD-10-CM | POA: Diagnosis not present

## 2021-05-26 DIAGNOSIS — Z7951 Long term (current) use of inhaled steroids: Secondary | ICD-10-CM | POA: Diagnosis not present

## 2021-05-29 DIAGNOSIS — Z7951 Long term (current) use of inhaled steroids: Secondary | ICD-10-CM | POA: Diagnosis not present

## 2021-05-29 DIAGNOSIS — F419 Anxiety disorder, unspecified: Secondary | ICD-10-CM | POA: Diagnosis not present

## 2021-05-29 DIAGNOSIS — F0391 Unspecified dementia with behavioral disturbance: Secondary | ICD-10-CM | POA: Diagnosis not present

## 2021-05-29 DIAGNOSIS — J440 Chronic obstructive pulmonary disease with acute lower respiratory infection: Secondary | ICD-10-CM | POA: Diagnosis not present

## 2021-05-29 DIAGNOSIS — N179 Acute kidney failure, unspecified: Secondary | ICD-10-CM | POA: Diagnosis not present

## 2021-05-29 DIAGNOSIS — Z9981 Dependence on supplemental oxygen: Secondary | ICD-10-CM | POA: Diagnosis not present

## 2021-05-29 DIAGNOSIS — I2510 Atherosclerotic heart disease of native coronary artery without angina pectoris: Secondary | ICD-10-CM | POA: Diagnosis not present

## 2021-05-29 DIAGNOSIS — J189 Pneumonia, unspecified organism: Secondary | ICD-10-CM | POA: Diagnosis not present

## 2021-05-29 DIAGNOSIS — I5032 Chronic diastolic (congestive) heart failure: Secondary | ICD-10-CM | POA: Diagnosis not present

## 2021-05-29 DIAGNOSIS — Z7902 Long term (current) use of antithrombotics/antiplatelets: Secondary | ICD-10-CM | POA: Diagnosis not present

## 2021-05-29 DIAGNOSIS — D649 Anemia, unspecified: Secondary | ICD-10-CM | POA: Diagnosis not present

## 2021-05-29 DIAGNOSIS — F32A Depression, unspecified: Secondary | ICD-10-CM | POA: Diagnosis not present

## 2021-05-29 DIAGNOSIS — E785 Hyperlipidemia, unspecified: Secondary | ICD-10-CM | POA: Diagnosis not present

## 2021-05-30 DIAGNOSIS — B351 Tinea unguium: Secondary | ICD-10-CM | POA: Diagnosis not present

## 2021-05-30 DIAGNOSIS — F419 Anxiety disorder, unspecified: Secondary | ICD-10-CM | POA: Diagnosis not present

## 2021-05-30 DIAGNOSIS — L609 Nail disorder, unspecified: Secondary | ICD-10-CM | POA: Diagnosis not present

## 2021-05-30 DIAGNOSIS — F330 Major depressive disorder, recurrent, mild: Secondary | ICD-10-CM | POA: Diagnosis not present

## 2021-06-02 DIAGNOSIS — R269 Unspecified abnormalities of gait and mobility: Secondary | ICD-10-CM | POA: Diagnosis not present

## 2021-06-02 DIAGNOSIS — M6281 Muscle weakness (generalized): Secondary | ICD-10-CM | POA: Diagnosis not present

## 2021-06-02 DIAGNOSIS — R54 Age-related physical debility: Secondary | ICD-10-CM | POA: Diagnosis not present

## 2021-06-02 DIAGNOSIS — R2689 Other abnormalities of gait and mobility: Secondary | ICD-10-CM | POA: Diagnosis not present

## 2021-06-12 DIAGNOSIS — D649 Anemia, unspecified: Secondary | ICD-10-CM | POA: Diagnosis not present

## 2021-06-12 DIAGNOSIS — N179 Acute kidney failure, unspecified: Secondary | ICD-10-CM | POA: Diagnosis not present

## 2021-06-12 DIAGNOSIS — I5032 Chronic diastolic (congestive) heart failure: Secondary | ICD-10-CM | POA: Diagnosis not present

## 2021-06-12 DIAGNOSIS — Z20828 Contact with and (suspected) exposure to other viral communicable diseases: Secondary | ICD-10-CM | POA: Diagnosis not present

## 2021-06-12 DIAGNOSIS — Z9981 Dependence on supplemental oxygen: Secondary | ICD-10-CM | POA: Diagnosis not present

## 2021-06-12 DIAGNOSIS — I2510 Atherosclerotic heart disease of native coronary artery without angina pectoris: Secondary | ICD-10-CM | POA: Diagnosis not present

## 2021-06-12 DIAGNOSIS — J189 Pneumonia, unspecified organism: Secondary | ICD-10-CM | POA: Diagnosis not present

## 2021-06-12 DIAGNOSIS — Z7951 Long term (current) use of inhaled steroids: Secondary | ICD-10-CM | POA: Diagnosis not present

## 2021-06-12 DIAGNOSIS — F32A Depression, unspecified: Secondary | ICD-10-CM | POA: Diagnosis not present

## 2021-06-12 DIAGNOSIS — Z7902 Long term (current) use of antithrombotics/antiplatelets: Secondary | ICD-10-CM | POA: Diagnosis not present

## 2021-06-12 DIAGNOSIS — F419 Anxiety disorder, unspecified: Secondary | ICD-10-CM | POA: Diagnosis not present

## 2021-06-12 DIAGNOSIS — J440 Chronic obstructive pulmonary disease with acute lower respiratory infection: Secondary | ICD-10-CM | POA: Diagnosis not present

## 2021-06-12 DIAGNOSIS — E785 Hyperlipidemia, unspecified: Secondary | ICD-10-CM | POA: Diagnosis not present

## 2021-06-12 DIAGNOSIS — F0391 Unspecified dementia with behavioral disturbance: Secondary | ICD-10-CM | POA: Diagnosis not present

## 2021-06-14 DIAGNOSIS — I5032 Chronic diastolic (congestive) heart failure: Secondary | ICD-10-CM | POA: Diagnosis not present

## 2021-06-14 DIAGNOSIS — F39 Unspecified mood [affective] disorder: Secondary | ICD-10-CM | POA: Diagnosis not present

## 2021-06-14 DIAGNOSIS — F0391 Unspecified dementia with behavioral disturbance: Secondary | ICD-10-CM | POA: Diagnosis not present

## 2021-06-14 DIAGNOSIS — J189 Pneumonia, unspecified organism: Secondary | ICD-10-CM | POA: Diagnosis not present

## 2021-06-14 DIAGNOSIS — F419 Anxiety disorder, unspecified: Secondary | ICD-10-CM | POA: Diagnosis not present

## 2021-06-14 DIAGNOSIS — J440 Chronic obstructive pulmonary disease with acute lower respiratory infection: Secondary | ICD-10-CM | POA: Diagnosis not present

## 2021-06-16 DIAGNOSIS — R799 Abnormal finding of blood chemistry, unspecified: Secondary | ICD-10-CM | POA: Diagnosis not present

## 2021-06-16 DIAGNOSIS — E538 Deficiency of other specified B group vitamins: Secondary | ICD-10-CM | POA: Diagnosis not present

## 2021-06-16 DIAGNOSIS — D509 Iron deficiency anemia, unspecified: Secondary | ICD-10-CM | POA: Diagnosis not present

## 2021-06-16 DIAGNOSIS — R54 Age-related physical debility: Secondary | ICD-10-CM | POA: Diagnosis not present

## 2021-06-19 DIAGNOSIS — Z20828 Contact with and (suspected) exposure to other viral communicable diseases: Secondary | ICD-10-CM | POA: Diagnosis not present

## 2021-06-21 DIAGNOSIS — F419 Anxiety disorder, unspecified: Secondary | ICD-10-CM | POA: Diagnosis not present

## 2021-06-21 DIAGNOSIS — F330 Major depressive disorder, recurrent, mild: Secondary | ICD-10-CM | POA: Diagnosis not present

## 2021-06-26 DIAGNOSIS — Z20828 Contact with and (suspected) exposure to other viral communicable diseases: Secondary | ICD-10-CM | POA: Diagnosis not present

## 2021-07-03 DIAGNOSIS — R0989 Other specified symptoms and signs involving the circulatory and respiratory systems: Secondary | ICD-10-CM | POA: Diagnosis not present

## 2021-07-03 DIAGNOSIS — R5383 Other fatigue: Secondary | ICD-10-CM | POA: Diagnosis not present

## 2021-07-04 DIAGNOSIS — G4710 Hypersomnia, unspecified: Secondary | ICD-10-CM | POA: Diagnosis not present

## 2021-07-04 DIAGNOSIS — R5381 Other malaise: Secondary | ICD-10-CM | POA: Diagnosis not present

## 2021-07-04 DIAGNOSIS — F0390 Unspecified dementia without behavioral disturbance: Secondary | ICD-10-CM | POA: Diagnosis not present

## 2021-07-04 DIAGNOSIS — R0989 Other specified symptoms and signs involving the circulatory and respiratory systems: Secondary | ICD-10-CM | POA: Diagnosis not present

## 2021-07-06 DIAGNOSIS — N390 Urinary tract infection, site not specified: Secondary | ICD-10-CM | POA: Diagnosis not present

## 2021-07-07 DIAGNOSIS — L299 Pruritus, unspecified: Secondary | ICD-10-CM | POA: Diagnosis not present

## 2021-07-07 DIAGNOSIS — L989 Disorder of the skin and subcutaneous tissue, unspecified: Secondary | ICD-10-CM | POA: Diagnosis not present

## 2021-07-07 DIAGNOSIS — F0390 Unspecified dementia without behavioral disturbance: Secondary | ICD-10-CM | POA: Diagnosis not present

## 2021-07-19 DIAGNOSIS — R451 Restlessness and agitation: Secondary | ICD-10-CM | POA: Diagnosis not present

## 2021-07-19 DIAGNOSIS — F419 Anxiety disorder, unspecified: Secondary | ICD-10-CM | POA: Diagnosis not present

## 2021-07-19 DIAGNOSIS — F330 Major depressive disorder, recurrent, mild: Secondary | ICD-10-CM | POA: Diagnosis not present

## 2021-07-19 DIAGNOSIS — F39 Unspecified mood [affective] disorder: Secondary | ICD-10-CM | POA: Diagnosis not present

## 2021-07-19 DIAGNOSIS — F0391 Unspecified dementia with behavioral disturbance: Secondary | ICD-10-CM | POA: Diagnosis not present

## 2021-07-21 DIAGNOSIS — H02152 Paralytic ectropion of right lower eyelid: Secondary | ICD-10-CM | POA: Diagnosis not present

## 2021-07-21 DIAGNOSIS — F0390 Unspecified dementia without behavioral disturbance: Secondary | ICD-10-CM | POA: Diagnosis not present

## 2021-07-25 DIAGNOSIS — N390 Urinary tract infection, site not specified: Secondary | ICD-10-CM | POA: Diagnosis not present

## 2021-07-27 DIAGNOSIS — Z87440 Personal history of urinary (tract) infections: Secondary | ICD-10-CM | POA: Diagnosis not present

## 2021-07-27 DIAGNOSIS — F0390 Unspecified dementia without behavioral disturbance: Secondary | ICD-10-CM | POA: Diagnosis not present

## 2021-07-27 DIAGNOSIS — R8290 Unspecified abnormal findings in urine: Secondary | ICD-10-CM | POA: Diagnosis not present

## 2021-07-28 DIAGNOSIS — N390 Urinary tract infection, site not specified: Secondary | ICD-10-CM | POA: Diagnosis not present

## 2021-08-01 DIAGNOSIS — F0391 Unspecified dementia with behavioral disturbance: Secondary | ICD-10-CM | POA: Diagnosis not present

## 2021-08-01 DIAGNOSIS — R451 Restlessness and agitation: Secondary | ICD-10-CM | POA: Diagnosis not present

## 2021-08-01 DIAGNOSIS — F419 Anxiety disorder, unspecified: Secondary | ICD-10-CM | POA: Diagnosis not present

## 2021-08-01 DIAGNOSIS — F39 Unspecified mood [affective] disorder: Secondary | ICD-10-CM | POA: Diagnosis not present

## 2021-08-09 DIAGNOSIS — M25551 Pain in right hip: Secondary | ICD-10-CM | POA: Diagnosis not present

## 2021-08-09 DIAGNOSIS — R410 Disorientation, unspecified: Secondary | ICD-10-CM | POA: Diagnosis not present

## 2021-08-09 DIAGNOSIS — W19XXXA Unspecified fall, initial encounter: Secondary | ICD-10-CM | POA: Diagnosis not present

## 2021-08-10 DIAGNOSIS — R269 Unspecified abnormalities of gait and mobility: Secondary | ICD-10-CM | POA: Diagnosis not present

## 2021-08-10 DIAGNOSIS — M6281 Muscle weakness (generalized): Secondary | ICD-10-CM | POA: Diagnosis not present

## 2021-08-10 DIAGNOSIS — R2689 Other abnormalities of gait and mobility: Secondary | ICD-10-CM | POA: Diagnosis not present

## 2021-08-10 DIAGNOSIS — R54 Age-related physical debility: Secondary | ICD-10-CM | POA: Diagnosis not present

## 2021-08-14 DIAGNOSIS — R799 Abnormal finding of blood chemistry, unspecified: Secondary | ICD-10-CM | POA: Diagnosis not present

## 2021-08-14 DIAGNOSIS — R634 Abnormal weight loss: Secondary | ICD-10-CM | POA: Diagnosis not present

## 2021-08-14 DIAGNOSIS — D509 Iron deficiency anemia, unspecified: Secondary | ICD-10-CM | POA: Diagnosis not present

## 2021-08-14 DIAGNOSIS — E538 Deficiency of other specified B group vitamins: Secondary | ICD-10-CM | POA: Diagnosis not present

## 2021-08-15 DIAGNOSIS — N390 Urinary tract infection, site not specified: Secondary | ICD-10-CM | POA: Diagnosis not present

## 2021-08-16 DIAGNOSIS — R451 Restlessness and agitation: Secondary | ICD-10-CM | POA: Diagnosis not present

## 2021-08-16 DIAGNOSIS — R634 Abnormal weight loss: Secondary | ICD-10-CM | POA: Diagnosis not present

## 2021-08-16 DIAGNOSIS — F419 Anxiety disorder, unspecified: Secondary | ICD-10-CM | POA: Diagnosis not present

## 2021-08-16 DIAGNOSIS — R54 Age-related physical debility: Secondary | ICD-10-CM | POA: Diagnosis not present

## 2021-08-22 DIAGNOSIS — L609 Nail disorder, unspecified: Secondary | ICD-10-CM | POA: Diagnosis not present

## 2021-08-22 DIAGNOSIS — B351 Tinea unguium: Secondary | ICD-10-CM | POA: Diagnosis not present

## 2021-08-23 DIAGNOSIS — F03918 Unspecified dementia, unspecified severity, with other behavioral disturbance: Secondary | ICD-10-CM | POA: Diagnosis not present

## 2021-08-23 DIAGNOSIS — R2689 Other abnormalities of gait and mobility: Secondary | ICD-10-CM | POA: Diagnosis not present

## 2021-08-23 DIAGNOSIS — R451 Restlessness and agitation: Secondary | ICD-10-CM | POA: Diagnosis not present

## 2021-08-23 DIAGNOSIS — M6281 Muscle weakness (generalized): Secondary | ICD-10-CM | POA: Diagnosis not present

## 2021-08-23 DIAGNOSIS — F0390 Unspecified dementia without behavioral disturbance: Secondary | ICD-10-CM | POA: Diagnosis not present

## 2021-08-23 DIAGNOSIS — F39 Unspecified mood [affective] disorder: Secondary | ICD-10-CM | POA: Diagnosis not present

## 2021-08-23 DIAGNOSIS — F419 Anxiety disorder, unspecified: Secondary | ICD-10-CM | POA: Diagnosis not present

## 2021-08-23 DIAGNOSIS — W19XXXA Unspecified fall, initial encounter: Secondary | ICD-10-CM | POA: Diagnosis not present

## 2021-09-05 DIAGNOSIS — F330 Major depressive disorder, recurrent, mild: Secondary | ICD-10-CM | POA: Diagnosis not present

## 2021-09-14 DIAGNOSIS — J449 Chronic obstructive pulmonary disease, unspecified: Secondary | ICD-10-CM | POA: Diagnosis not present

## 2021-09-14 DIAGNOSIS — I129 Hypertensive chronic kidney disease with stage 1 through stage 4 chronic kidney disease, or unspecified chronic kidney disease: Secondary | ICD-10-CM | POA: Diagnosis not present

## 2021-09-14 DIAGNOSIS — N1831 Chronic kidney disease, stage 3a: Secondary | ICD-10-CM | POA: Diagnosis not present

## 2021-09-14 DIAGNOSIS — I2510 Atherosclerotic heart disease of native coronary artery without angina pectoris: Secondary | ICD-10-CM | POA: Diagnosis not present

## 2021-09-20 DIAGNOSIS — F419 Anxiety disorder, unspecified: Secondary | ICD-10-CM | POA: Diagnosis not present

## 2021-09-20 DIAGNOSIS — F39 Unspecified mood [affective] disorder: Secondary | ICD-10-CM | POA: Diagnosis not present

## 2021-09-20 DIAGNOSIS — F03918 Unspecified dementia, unspecified severity, with other behavioral disturbance: Secondary | ICD-10-CM | POA: Diagnosis not present

## 2021-09-20 DIAGNOSIS — R451 Restlessness and agitation: Secondary | ICD-10-CM | POA: Diagnosis not present

## 2021-09-21 DIAGNOSIS — D649 Anemia, unspecified: Secondary | ICD-10-CM | POA: Diagnosis not present

## 2021-09-21 DIAGNOSIS — R799 Abnormal finding of blood chemistry, unspecified: Secondary | ICD-10-CM | POA: Diagnosis not present

## 2021-09-21 DIAGNOSIS — F0390 Unspecified dementia without behavioral disturbance: Secondary | ICD-10-CM | POA: Diagnosis not present

## 2021-09-21 DIAGNOSIS — N1832 Chronic kidney disease, stage 3b: Secondary | ICD-10-CM | POA: Diagnosis not present

## 2021-09-26 DIAGNOSIS — F39 Unspecified mood [affective] disorder: Secondary | ICD-10-CM | POA: Diagnosis not present

## 2021-09-26 DIAGNOSIS — F419 Anxiety disorder, unspecified: Secondary | ICD-10-CM | POA: Diagnosis not present

## 2021-09-26 DIAGNOSIS — R451 Restlessness and agitation: Secondary | ICD-10-CM | POA: Diagnosis not present

## 2021-09-26 DIAGNOSIS — F03918 Unspecified dementia, unspecified severity, with other behavioral disturbance: Secondary | ICD-10-CM | POA: Diagnosis not present

## 2021-10-04 DIAGNOSIS — F0390 Unspecified dementia without behavioral disturbance: Secondary | ICD-10-CM | POA: Diagnosis not present

## 2021-10-04 DIAGNOSIS — R21 Rash and other nonspecific skin eruption: Secondary | ICD-10-CM | POA: Diagnosis not present

## 2021-10-04 DIAGNOSIS — L299 Pruritus, unspecified: Secondary | ICD-10-CM | POA: Diagnosis not present

## 2021-10-11 ENCOUNTER — Emergency Department (HOSPITAL_COMMUNITY): Payer: Medicare Other

## 2021-10-11 ENCOUNTER — Inpatient Hospital Stay (HOSPITAL_COMMUNITY)
Admission: EM | Admit: 2021-10-11 | Discharge: 2021-10-19 | DRG: 871 | Disposition: A | Discharge status: Home Health | Payer: Medicare Other | Source: Skilled Nursing Facility | Attending: Internal Medicine | Admitting: Internal Medicine

## 2021-10-11 DIAGNOSIS — R6520 Severe sepsis without septic shock: Secondary | ICD-10-CM | POA: Diagnosis not present

## 2021-10-11 DIAGNOSIS — D6489 Other specified anemias: Secondary | ICD-10-CM | POA: Diagnosis present

## 2021-10-11 DIAGNOSIS — I2510 Atherosclerotic heart disease of native coronary artery without angina pectoris: Secondary | ICD-10-CM | POA: Diagnosis present

## 2021-10-11 DIAGNOSIS — N400 Enlarged prostate without lower urinary tract symptoms: Secondary | ICD-10-CM | POA: Diagnosis present

## 2021-10-11 DIAGNOSIS — Z7902 Long term (current) use of antithrombotics/antiplatelets: Secondary | ICD-10-CM | POA: Diagnosis not present

## 2021-10-11 DIAGNOSIS — F05 Delirium due to known physiological condition: Secondary | ICD-10-CM | POA: Diagnosis present

## 2021-10-11 DIAGNOSIS — J90 Pleural effusion, not elsewhere classified: Secondary | ICD-10-CM | POA: Diagnosis not present

## 2021-10-11 DIAGNOSIS — Z96653 Presence of artificial knee joint, bilateral: Secondary | ICD-10-CM | POA: Diagnosis present

## 2021-10-11 DIAGNOSIS — N179 Acute kidney failure, unspecified: Secondary | ICD-10-CM | POA: Diagnosis present

## 2021-10-11 DIAGNOSIS — Z8249 Family history of ischemic heart disease and other diseases of the circulatory system: Secondary | ICD-10-CM

## 2021-10-11 DIAGNOSIS — D696 Thrombocytopenia, unspecified: Secondary | ICD-10-CM | POA: Diagnosis present

## 2021-10-11 DIAGNOSIS — I5032 Chronic diastolic (congestive) heart failure: Secondary | ICD-10-CM | POA: Diagnosis not present

## 2021-10-11 DIAGNOSIS — Z515 Encounter for palliative care: Secondary | ICD-10-CM | POA: Diagnosis not present

## 2021-10-11 DIAGNOSIS — I4891 Unspecified atrial fibrillation: Secondary | ICD-10-CM | POA: Diagnosis not present

## 2021-10-11 DIAGNOSIS — K449 Diaphragmatic hernia without obstruction or gangrene: Secondary | ICD-10-CM | POA: Diagnosis not present

## 2021-10-11 DIAGNOSIS — R1312 Dysphagia, oropharyngeal phase: Secondary | ICD-10-CM | POA: Diagnosis not present

## 2021-10-11 DIAGNOSIS — Z7189 Other specified counseling: Secondary | ICD-10-CM | POA: Diagnosis not present

## 2021-10-11 DIAGNOSIS — R0602 Shortness of breath: Secondary | ICD-10-CM

## 2021-10-11 DIAGNOSIS — I130 Hypertensive heart and chronic kidney disease with heart failure and stage 1 through stage 4 chronic kidney disease, or unspecified chronic kidney disease: Secondary | ICD-10-CM | POA: Diagnosis present

## 2021-10-11 DIAGNOSIS — Z9181 History of falling: Secondary | ICD-10-CM

## 2021-10-11 DIAGNOSIS — N390 Urinary tract infection, site not specified: Secondary | ICD-10-CM | POA: Diagnosis not present

## 2021-10-11 DIAGNOSIS — E872 Acidosis, unspecified: Secondary | ICD-10-CM | POA: Diagnosis not present

## 2021-10-11 DIAGNOSIS — Z20822 Contact with and (suspected) exposure to covid-19: Secondary | ICD-10-CM | POA: Diagnosis present

## 2021-10-11 DIAGNOSIS — J9601 Acute respiratory failure with hypoxia: Secondary | ICD-10-CM | POA: Diagnosis present

## 2021-10-11 DIAGNOSIS — J440 Chronic obstructive pulmonary disease with acute lower respiratory infection: Secondary | ICD-10-CM | POA: Diagnosis present

## 2021-10-11 DIAGNOSIS — A419 Sepsis, unspecified organism: Secondary | ICD-10-CM | POA: Diagnosis not present

## 2021-10-11 DIAGNOSIS — Z789 Other specified health status: Secondary | ICD-10-CM | POA: Diagnosis not present

## 2021-10-11 DIAGNOSIS — R7989 Other specified abnormal findings of blood chemistry: Secondary | ICD-10-CM | POA: Diagnosis not present

## 2021-10-11 DIAGNOSIS — J9690 Respiratory failure, unspecified, unspecified whether with hypoxia or hypercapnia: Secondary | ICD-10-CM

## 2021-10-11 DIAGNOSIS — G9341 Metabolic encephalopathy: Secondary | ICD-10-CM | POA: Diagnosis not present

## 2021-10-11 DIAGNOSIS — R059 Cough, unspecified: Secondary | ICD-10-CM | POA: Diagnosis not present

## 2021-10-11 DIAGNOSIS — J189 Pneumonia, unspecified organism: Secondary | ICD-10-CM | POA: Diagnosis present

## 2021-10-11 DIAGNOSIS — R509 Fever, unspecified: Secondary | ICD-10-CM | POA: Diagnosis not present

## 2021-10-11 DIAGNOSIS — N1831 Chronic kidney disease, stage 3a: Secondary | ICD-10-CM | POA: Diagnosis not present

## 2021-10-11 DIAGNOSIS — Z823 Family history of stroke: Secondary | ICD-10-CM

## 2021-10-11 DIAGNOSIS — F0390 Unspecified dementia without behavioral disturbance: Secondary | ICD-10-CM | POA: Diagnosis present

## 2021-10-11 DIAGNOSIS — J690 Pneumonitis due to inhalation of food and vomit: Secondary | ICD-10-CM | POA: Diagnosis not present

## 2021-10-11 DIAGNOSIS — I517 Cardiomegaly: Secondary | ICD-10-CM | POA: Diagnosis not present

## 2021-10-11 DIAGNOSIS — J9811 Atelectasis: Secondary | ICD-10-CM | POA: Diagnosis not present

## 2021-10-11 MED ORDER — SODIUM CHLORIDE 0.9 % IV SOLN: Freq: Once | INTRAVENOUS | Status: AC

## 2021-10-11 NOTE — ED Provider Notes (Signed)
[facility] EMERGENCY DEPARTMENT Provider Note  CSN: [id number] Arrival date & time: 10/11/21 2304  Chief Complaint(s) Shortness of Breath  ED Triage Notes Cochrane, Bing Quarry, RN (Registered Nurse)   Nursing   Date of Service: 10/11/2021 11:20 PM   Signed   Pt here from [facility] [place] via [facility] for respiratory distress. Staff gave pt an albuterol neb pta. Ems noted rales on R, and clear L lung sounds. EMS gave another albuterol neb and a duoneb, and 125mg  solumedrol w/ some relief. Ems placed pt on 2L nasal cannula, pt's spO2 94%. Labored respirations noted in triage. 105 HR, 108/60, 95% 2L, CBG 159. 20g R AC     HPI JAHKARI MACLIN is a 87 y.o. male here from [facility] for shortness of breath. He is hard of hearing and has a h/o dementia.  Remainder of history, ROS, and physical exam limited due to patient's condition (dementia). Additional information was obtained from EMS.   Level V Caveat.   HPI  Past Medical History Past Medical History:  Diagnosis Date   Anemia, unspecified    BPH (benign prostatic hyperplasia)    CAD (coronary artery disease)    Calculus of gallbladder without mention of cholecystitis or obstruction    DDD (degenerative disc disease)    DJD (degenerative joint disease)    Dyspnea    HLD (hyperlipidemia)    HTN (hypertension)    Low testosterone    Lower back pain    MI (myocardial infarction) (HCC)    Osteoarthritis    Other testicular hypofunction    Spinal stenosis    Patient Active Problem List   Diagnosis Date Noted   Severe sepsis (HCC) 10/12/2021   HLD (hyperlipidemia)    HTN (hypertension)    BPH (benign prostatic hyperplasia)    Spinal stenosis    DDD (degenerative disc disease)    CAD (coronary artery disease)    Anemia, unspecified    Calculus of gallbladder without mention of cholecystitis or obstruction    Lower back pain    CARDIOMEGALY 02/18/2008   LUNG NODULE 02/18/2008   DYSPNEA 02/18/2008    Home Medication(s) Prior to Admission medications   Medication Sig Start Date End Date Taking? Authorizing Provider  acetaminophen (TYLENOL) 325 MG tablet Take 325 mg by mouth 3 (three) times daily as needed for mild pain.   Yes [provider]  albuterol (PROVENTIL) (2.5 MG/3ML) 0.083% nebulizer solution Take 2.5 mg by nebulization 3 (three) times daily.   Yes [provider]  albuterol (VENTOLIN HFA) 108 (90 Base) MCG/ACT inhaler Inhale 2 puffs into the lungs every 4 (four) hours as needed for wheezing or shortness of breath.   Yes [provider]  amoxicillin (AMOXIL) 500 MG capsule Take 2,000 mg by mouth See admin instructions. 1 hour prior to dental appointment   Yes [provider]  anti-nausea (EMETROL) solution Take 15 mLs by mouth as needed for nausea.   Yes [provider]  ARIPiprazole (ABILIFY) 5 MG tablet Take 5 mg by mouth at bedtime.   Yes [provider]  ARTIFICIAL TEAR OP Place 2 drops into both eyes in the morning and at bedtime.   Yes [provider]  aspirin 81 MG chewable tablet Take 81 mg by mouth daily.   Yes [provider]  B Complex Vitamins (VITAMIN-B COMPLEX PO) Take 1 tablet by mouth daily.   Yes [provider]  clopidogrel (PLAVIX) 75 MG tablet Take 75 mg  by mouth daily with breakfast.   Yes [provider]  dextromethorphan (DELSYM) 30 MG/5ML liquid Take 10 mLs by mouth 2 (two) times daily as needed for cough.   Yes [provider]  lidocaine (LIDODERM) 5 % Place 1 patch onto the skin daily. Remove & Discard patch within 12 hours or as directed by MD   Yes [provider]  loperamide (IMODIUM A-D) 2 MG tablet Take 2-4 mg by mouth See admin instructions. 4 mg after first loose stool, then 2 mg after each loose stool ** max 4 tabs in 24 hours **   Yes [provider]  meloxicam (MOBIC) 7.5 MG tablet Take 2 tablets by mouth daily.   Yes [provider]  Menthol, Topical Analgesic, (BIOFREEZE) 4 % GEL Apply 1 application topically 2 (two) times daily as needed (shoulder pain).   Yes [provider]  mometasone (ASMANEX, 30 METERED DOSES,) 220 MCG/ACT inhaler Inhale 2 puffs into the lungs every evening.   Yes [provider]  OVER THE COUNTER MEDICATION Apply 1 application topically See admin instructions. CBD pain stick apply to area of pain  on shoulder 3 times daily as needed for pain   Yes [provider]  OVER THE COUNTER MEDICATION Take 2 tablets by mouth in the morning and at bedtime. Juice Plus Garden Chew Gummies   Yes [provider]  OVER THE COUNTER MEDICATION Take 2 tablets by mouth in the morning and at bedtime. Juice Plus Orchard Chews   Yes [provider]  OVER THE COUNTER MEDICATION Take 2 tablets by mouth in the morning and at bedtime. Juice Plus Vineyard chews   Yes [provider]  oxyCODONE-acetaminophen (PERCOCET) 10-325 MG per tablet Take 1 tablet by mouth 4 (four) times daily as needed.    Yes [provider]  Polyethyl Glycol-Propyl Glycol (SYSTANE) 0.4-0.3 % GEL ophthalmic gel Place 1 application into both eyes in the morning and at bedtime.   Yes [provider]  sennosides-docusate sodium (SENOKOT-S) 8.6-50 MG tablet Take 1 tablet by mouth in the morning and at bedtime.   Yes [provider]  sertraline (ZOLOFT) 50 MG tablet Take 50 mg by mouth daily.   Yes [provider]  tamsulosin (FLOMAX) 0.4 MG CAPS capsule Take 0.4 mg by mouth at bedtime.   Yes [provider]                                                                                                                                    Past Surgical History ** The histories are not reviewed yet. Please review them in the "History" navigator section and refresh this SmartLink. Family History Family History  Problem Relation Age of Onset   Cancer  Father    Hypertension Mother    Other Mother        cerebral hemorrhage   Heart attack Other  sibling   Heart disease Other        sibling   Hypertension Other        child   Stroke Mother     Social History Social History   Tobacco Use   Smoking status: Never   Smokeless tobacco: Never  Substance Use Topics   Alcohol use: No   Drug use: No   Allergies Patient has no known allergies.  Review of Systems Review of Systems Unable to obtain due to dementia Physical Exam Vital Signs  I have reviewed the triage vital signs BP 109/64   Pulse 87   Temp (!) 101.5 F (38.6 C) (Rectal)   Resp (!) 22   SpO2 98%   Physical Exam Vitals reviewed.  Constitutional:      General: He is not in acute distress.    Appearance: He is well-developed. He is not diaphoretic.  HENT:     Head: Normocephalic and atraumatic.     Nose: Nose normal.  Eyes:     General: No scleral icterus.       Right eye: No discharge.        Left eye: No discharge.     Conjunctiva/sclera: Conjunctivae normal.     Pupils: Pupils are equal, round, and reactive to light.  Cardiovascular:     Rate and Rhythm: Tachycardia present. Rhythm regularly irregular.     Heart sounds: No murmur heard.   No friction rub. No gallop.  Pulmonary:     Effort: Pulmonary effort is normal. No respiratory distress.     Breath sounds: No stridor. Examination of the right-middle field reveals rhonchi. Rhonchi present. No rales.  Abdominal:     General: There is no distension.     Palpations: Abdomen is soft.     Tenderness: There is no abdominal tenderness.  Musculoskeletal:        General: No tenderness.     Cervical back: Normal range of motion and neck supple.     Right lower leg: 1+ Pitting Edema present.     Left lower leg: 1+ Pitting Edema present.  Skin:    General: Skin is warm and dry.     Findings: No erythema or rash.  Neurological:     Mental Status: He is alert.     Comments: Hard of  hearing. Follows commands     ED Results and Treatments Labs (all labs ordered are listed, but only abnormal results are displayed) Labs Reviewed  CBC WITH DIFFERENTIAL/PLATELET - Abnormal; Notable for the following components:      Result Value   WBC 13.4 (*)    RBC 3.64 (*)    Hemoglobin 11.3 (*)    HCT 35.8 (*)    Neutro Abs 12.7 (*)    Lymphs Abs 0.4 (*)    Abs Immature Granulocytes 0.09 (*)    All other components within normal limits  COMPREHENSIVE METABOLIC PANEL - Abnormal; Notable for the following components:   CO2 21 (*)    Glucose, Bld 179 (*)    BUN 35 (*)    Creatinine, Ser 1.92 (*)    GFR, Estimated 31 (*)    All other components within normal limits  BRAIN NATRIURETIC PEPTIDE - Abnormal; Notable for the following components:   B Natriuretic Peptide 154.4 (*)    All other components within normal limits  LACTIC ACID, PLASMA - Abnormal; Notable for the following components:   Lactic Acid, Venous 2.0 (*)    All other components  within normal limits  RESP PANEL BY RT-PCR (FLU A&B, COVID) ARPGX2  CULTURE, BLOOD (ROUTINE X 2)  CULTURE, BLOOD (ROUTINE X 2)  LACTIC ACID, PLASMA  PHOSPHORUS  MAGNESIUM  COMPREHENSIVE METABOLIC PANEL  CBC WITH DIFFERENTIAL/PLATELET  PROCALCITONIN                                                                                                                         EKG  EKG Interpretation  Date/Time:  Wednesday October 11 2021 23:32:53 EST Ventricular Rate:  97 PR Interval:  175 QRS Duration: 112 QT Interval:  332 QTC Calculation: 422 R Axis:   68 Text Interpretation: Sinus tachycardia Atrial premature complexes Borderline intraventricular conduction delay Borderline repolarization abnormality Confirmed by Addison Lank [phone]) on 10/11/2021 11:39:29 PM       Radiology DG Chest 2 View  Result Date: 10/12/2021 CLINICAL DATA:  Shortness of breath. EXAM: CHEST - 2 VIEW COMPARISON:  Chest radiograph dated 02/20/2007.  FINDINGS: Evaluation is limited due to patient's positioning. Right perihilar densities may represent edema, atelectasis, or pneumonia. There are bibasilar atelectasis. No large pleural effusion or pneumothorax. Stable cardiac silhouette. Moderate size hiatal hernia. Osteopenia with degenerative changes of the spine. Multilevel chronic compression fractures. No acute osseous pathology. IMPRESSION: Right perihilar densities may represent edema, atelectasis, or pneumonia. Electronically Signed   By: Anner Crete M.D.   On: 10/12/2021 00:00    Pertinent labs & imaging results that were available during my care of the patient were reviewed by me and considered in my medical decision making (see MDM for details).  Medications Ordered in ED Medications  cefTRIAXone (ROCEPHIN) 2 g in sodium chloride 0.9 % 100 mL IVPB (0 g Intravenous Stopped 10/12/21 0206)  azithromycin (ZITHROMAX) 500 mg in sodium chloride 0.9 % 250 mL IVPB (0 mg Intravenous Stopped 10/12/21 0308)  acetaminophen (TYLENOL) suppository 650 mg (has no administration in time range)  acetaminophen (TYLENOL) tablet 650 mg (has no administration in time range)    Or  acetaminophen (TYLENOL) suppository 650 mg (has no administration in time range)  0.9 %  sodium chloride infusion ( Intravenous New Bag/Given 10/12/21 0137)                                                                                                                                     Procedures .Critical Care Performed by: Fatima Blank, MD Authorized by: Leonette Monarch  Grayce Sessions, MD   Critical care provider statement:    Critical care time (minutes):  45   Critical care time was exclusive of:  Separately billable procedures and treating other patients   Critical care was necessary to treat or prevent imminent or life-threatening deterioration of the following conditions:  Sepsis and respiratory failure   Critical care was time spent personally by me on the  following activities:  Development of treatment plan with patient or surrogate, discussions with consultants, evaluation of patient's response to treatment, examination of patient, obtaining history from patient or surrogate, review of old charts, re-evaluation of patient's condition, pulse oximetry, ordering and review of radiographic studies, ordering and review of laboratory studies and ordering and performing treatments and interventions   Care discussed with: admitting provider    (including critical care time)  Medical Decision Making / ED Course I have reviewed the nursing notes for this encounter and the patient's prior records (if available in EHR or on provided paperwork).  DOMER LOFFREDO was evaluated in Emergency Department on 10/12/2021 for the symptoms described in the history of present illness. He was evaluated in the context of the global COVID-19 pandemic, which necessitated consideration that the patient might be at risk for infection with the SARS-CoV-2 virus that causes COVID-19. Institutional protocols and algorithms that pertain to the evaluation of patients at risk for COVID-19 are in a state of rapid change based on information released by regulatory bodies including the CDC and federal and state organizations. These policies and algorithms were followed during the patient's care in the ED.     Brought in by EMS for SOB. Given meds above. Placed on [facility]. Noted to be tachycardic and hypotensive in triage. Immediately brought back to resus bay.   He endorses SOB. No pain. Has a notable wet cough with thick sputum. Noted to be warm to touch. Rectal temp 101.5. sepsis work up started.   Pertinent labs & imaging results that were available during my care of the patient were reviewed by me and considered in my medical decision making:  Chest x-ray notable for likely pneumonia. Leukocytosis on CBC. Code sepsis initiated and patient started on empiric antibiotics.  Lactic acid  at 2.0. getting IVF.  Patient does not require 30 cc/kg of IV fluids.  He is remaining hemodynamically stable.  Patient was satting well on 2 L nasal cannula but is a mouth breather and was placed on nonrebreather for optimal oxygen delivery.  Admitted to medicine for further management.   Final Clinical Impression(s) / ED Diagnoses Final diagnoses:  Sepsis with acute hypoxic respiratory failure without septic shock, due to unspecified organism [facility])  Community acquired pneumonia of right lower lobe of lung     This chart was dictated using voice recognition software.  Despite best efforts to proofread,  errors can occur which can change the documentation meaning.    Fatima Blank, MD 10/12/21 [phone]

## 2021-10-11 NOTE — ED Provider Notes (Signed)
Emergency Medicine Provider Triage Evaluation Note  Andrew Frey , a 87 y.o. male  was evaluated in triage.  Pt complains of sob. Pt from SNF, h/o dementia. H/o copd, chf. No known fever. Wears 2 L PRN O2.   Review of Systems  Positive: Sob, cough Negative: fever  Physical Exam  There were no vitals taken for this visit. Gen:   Awake, no distress   Resp:  Normal effort. On O2. Expiratory wheezing.  MSK:   Moves extremities without difficulty  Other:  No ttp of abd. No leg swelling. Irregularly irregular.   Medical Decision Making  Medically screening exam initiated at 11:08 PM.  Appropriate orders placed.  Andrew Frey was informed that the remainder of the evaluation will be completed by another provider, this initial triage assessment does not replace that evaluation, and the importance of remaining in the ED until their evaluation is complete.  Labs, cxr, ekg, resp panel.   Pts BP low, in new onset a fib. Pt to be moved to a room    Andrew Apley, PA-C 10/11/21 2320    Andrew Fossa, MD 10/12/21 [phone]

## 2021-10-11 NOTE — ED Triage Notes (Signed)
Pt here from [facility] in [place] via [facility] for respiratory distress. Staff gave pt an albuterol neb pta. Ems noted rales on R, and clear L lung sounds. EMS gave another albuterol neb and a duoneb, and 125mg  solumedrol w/ some relief. Ems placed pt on 2L nasal cannula, pt's spO2 94%. Labored respirations noted in triage. 105 HR, 108/60, 95% 2L, CBG 159. 20g R AC

## 2021-10-12 ENCOUNTER — Encounter (HOSPITAL_COMMUNITY): Payer: Self-pay | Admitting: Internal Medicine

## 2021-10-12 DIAGNOSIS — R7989 Other specified abnormal findings of blood chemistry: Secondary | ICD-10-CM

## 2021-10-12 DIAGNOSIS — N179 Acute kidney failure, unspecified: Secondary | ICD-10-CM | POA: Diagnosis present

## 2021-10-12 DIAGNOSIS — R0602 Shortness of breath: Secondary | ICD-10-CM | POA: Diagnosis not present

## 2021-10-12 DIAGNOSIS — D6489 Other specified anemias: Secondary | ICD-10-CM | POA: Diagnosis present

## 2021-10-12 DIAGNOSIS — J690 Pneumonitis due to inhalation of food and vomit: Secondary | ICD-10-CM | POA: Diagnosis present

## 2021-10-12 DIAGNOSIS — J189 Pneumonia, unspecified organism: Secondary | ICD-10-CM

## 2021-10-12 DIAGNOSIS — Z96653 Presence of artificial knee joint, bilateral: Secondary | ICD-10-CM | POA: Diagnosis present

## 2021-10-12 DIAGNOSIS — Z515 Encounter for palliative care: Secondary | ICD-10-CM

## 2021-10-12 DIAGNOSIS — N400 Enlarged prostate without lower urinary tract symptoms: Secondary | ICD-10-CM | POA: Diagnosis present

## 2021-10-12 DIAGNOSIS — J9601 Acute respiratory failure with hypoxia: Secondary | ICD-10-CM | POA: Diagnosis present

## 2021-10-12 DIAGNOSIS — E872 Acidosis, unspecified: Secondary | ICD-10-CM | POA: Diagnosis present

## 2021-10-12 DIAGNOSIS — N1831 Chronic kidney disease, stage 3a: Secondary | ICD-10-CM | POA: Diagnosis present

## 2021-10-12 DIAGNOSIS — A419 Sepsis, unspecified organism: Secondary | ICD-10-CM | POA: Diagnosis present

## 2021-10-12 DIAGNOSIS — Z9181 History of falling: Secondary | ICD-10-CM | POA: Diagnosis not present

## 2021-10-12 DIAGNOSIS — Z20822 Contact with and (suspected) exposure to covid-19: Secondary | ICD-10-CM | POA: Diagnosis present

## 2021-10-12 DIAGNOSIS — F0390 Unspecified dementia without behavioral disturbance: Secondary | ICD-10-CM | POA: Diagnosis present

## 2021-10-12 DIAGNOSIS — Z7902 Long term (current) use of antithrombotics/antiplatelets: Secondary | ICD-10-CM | POA: Diagnosis not present

## 2021-10-12 DIAGNOSIS — R6520 Severe sepsis without septic shock: Secondary | ICD-10-CM | POA: Diagnosis present

## 2021-10-12 DIAGNOSIS — J440 Chronic obstructive pulmonary disease with acute lower respiratory infection: Secondary | ICD-10-CM | POA: Diagnosis present

## 2021-10-12 DIAGNOSIS — G9341 Metabolic encephalopathy: Secondary | ICD-10-CM | POA: Diagnosis present

## 2021-10-12 DIAGNOSIS — Z789 Other specified health status: Secondary | ICD-10-CM

## 2021-10-12 DIAGNOSIS — I130 Hypertensive heart and chronic kidney disease with heart failure and stage 1 through stage 4 chronic kidney disease, or unspecified chronic kidney disease: Secondary | ICD-10-CM | POA: Diagnosis present

## 2021-10-12 DIAGNOSIS — R509 Fever, unspecified: Secondary | ICD-10-CM | POA: Diagnosis present

## 2021-10-12 DIAGNOSIS — D696 Thrombocytopenia, unspecified: Secondary | ICD-10-CM | POA: Diagnosis present

## 2021-10-12 DIAGNOSIS — N390 Urinary tract infection, site not specified: Secondary | ICD-10-CM | POA: Diagnosis present

## 2021-10-12 DIAGNOSIS — Z7189 Other specified counseling: Secondary | ICD-10-CM

## 2021-10-12 DIAGNOSIS — I4891 Unspecified atrial fibrillation: Secondary | ICD-10-CM | POA: Diagnosis present

## 2021-10-12 DIAGNOSIS — I5032 Chronic diastolic (congestive) heart failure: Secondary | ICD-10-CM | POA: Diagnosis present

## 2021-10-12 DIAGNOSIS — I2510 Atherosclerotic heart disease of native coronary artery without angina pectoris: Secondary | ICD-10-CM | POA: Diagnosis present

## 2021-10-12 DIAGNOSIS — F05 Delirium due to known physiological condition: Secondary | ICD-10-CM | POA: Diagnosis present

## 2021-10-12 LAB — CBC WITH DIFFERENTIAL/PLATELET
Abs Immature Granulocytes: 0.09 10*3/uL — ABNORMAL HIGH (ref 0.00–0.07)
Abs Immature Granulocytes: 0.07 10*3/uL (ref 0.00–0.07)
Basophils Absolute: 0 10*3/uL (ref 0.0–0.1)
Basophils Absolute: 0 10*3/uL (ref 0.0–0.1)
Basophils Relative: 0 %
Basophils Relative: 0 %
Eosinophils Absolute: 0 10*3/uL (ref 0.0–0.5)
Eosinophils Absolute: 0 10*3/uL (ref 0.0–0.5)
Eosinophils Relative: 0 %
Eosinophils Relative: 0 %
HCT: 35.8 % — ABNORMAL LOW (ref 39.0–52.0)
HCT: 35.8 % — ABNORMAL LOW (ref 39.0–52.0)
Hemoglobin: 11.3 g/dL — ABNORMAL LOW (ref 13.0–17.0)
Hemoglobin: 10.8 g/dL — ABNORMAL LOW (ref 13.0–17.0)
Immature Granulocytes: 1 %
Immature Granulocytes: 1 %
Lymphocytes Relative: 3 %
Lymphocytes Relative: 6 %
Lymphs Abs: 0.4 10*3/uL — ABNORMAL LOW (ref 0.7–4.0)
Lymphs Abs: 0.8 10*3/uL (ref 0.7–4.0)
MCH: 31 pg (ref 26.0–34.0)
MCH: 30.5 pg (ref 26.0–34.0)
MCHC: 31.6 g/dL (ref 30.0–36.0)
MCHC: 30.2 g/dL (ref 30.0–36.0)
MCV: 98.4 fL (ref 80.0–100.0)
MCV: 101.1 fL — ABNORMAL HIGH (ref 80.0–100.0)
Monocytes Absolute: 0.2 10*3/uL (ref 0.1–1.0)
Monocytes Absolute: 0.4 10*3/uL (ref 0.1–1.0)
Monocytes Relative: 1 %
Monocytes Relative: 3 %
Neutro Abs: 12.7 10*3/uL — ABNORMAL HIGH (ref 1.7–7.7)
Neutro Abs: 12.1 10*3/uL — ABNORMAL HIGH (ref 1.7–7.7)
Neutrophils Relative %: 95 %
Neutrophils Relative %: 90 %
Platelets: 160 10*3/uL (ref 150–400)
Platelets: 154 10*3/uL (ref 150–400)
RBC: 3.64 MIL/uL — ABNORMAL LOW (ref 4.22–5.81)
RBC: 3.54 MIL/uL — ABNORMAL LOW (ref 4.22–5.81)
RDW: 13.9 % (ref 11.5–15.5)
RDW: 14 % (ref 11.5–15.5)
WBC: 13.4 10*3/uL — ABNORMAL HIGH (ref 4.0–10.5)
WBC: 13.4 10*3/uL — ABNORMAL HIGH (ref 4.0–10.5)
nRBC: 0 % (ref 0.0–0.2)
nRBC: 0 % (ref 0.0–0.2)

## 2021-10-12 LAB — COMPREHENSIVE METABOLIC PANEL
ALT: 13 U/L (ref 0–44)
ALT: 13 U/L (ref 0–44)
AST: 16 U/L (ref 15–41)
AST: 15 U/L (ref 15–41)
Albumin: 3.7 g/dL (ref 3.5–5.0)
Albumin: 3.4 g/dL — ABNORMAL LOW (ref 3.5–5.0)
Alkaline Phosphatase: 38 U/L (ref 38–126)
Alkaline Phosphatase: 37 U/L — ABNORMAL LOW (ref 38–126)
Anion gap: 11 (ref 5–15)
Anion gap: 10 (ref 5–15)
BUN: 35 mg/dL — ABNORMAL HIGH (ref 8–23)
BUN: 35 mg/dL — ABNORMAL HIGH (ref 8–23)
CO2: 21 mmol/L — ABNORMAL LOW (ref 22–32)
CO2: 20 mmol/L — ABNORMAL LOW (ref 22–32)
Calcium: 9.3 mg/dL (ref 8.9–10.3)
Calcium: 9.2 mg/dL (ref 8.9–10.3)
Chloride: 109 mmol/L (ref 98–111)
Chloride: 111 mmol/L (ref 98–111)
Creatinine, Ser: 1.92 mg/dL — ABNORMAL HIGH (ref 0.61–1.24)
Creatinine, Ser: 1.75 mg/dL — ABNORMAL HIGH (ref 0.61–1.24)
GFR, Estimated: 31 mL/min — ABNORMAL LOW (ref >60)
GFR, Estimated: 35 mL/min — ABNORMAL LOW (ref >60)
Glucose, Bld: 179 mg/dL — ABNORMAL HIGH (ref 70–99)
Glucose, Bld: 174 mg/dL — ABNORMAL HIGH (ref 70–99)
Potassium: 3.7 mmol/L (ref 3.5–5.1)
Potassium: 3.8 mmol/L (ref 3.5–5.1)
Sodium: 141 mmol/L (ref 135–145)
Sodium: 141 mmol/L (ref 135–145)
Total Bilirubin: 0.6 mg/dL (ref 0.3–1.2)
Total Bilirubin: 0.4 mg/dL (ref 0.3–1.2)
Total Protein: 6.6 g/dL (ref 6.5–8.1)
Total Protein: 6.3 g/dL — ABNORMAL LOW (ref 6.5–8.1)

## 2021-10-12 LAB — RESP PANEL BY RT-PCR (FLU A&B, COVID) ARPGX2
Influenza A by PCR: NEGATIVE
Influenza B by PCR: NEGATIVE
SARS Coronavirus 2 by RT PCR: NEGATIVE

## 2021-10-12 LAB — I-STAT VENOUS BLOOD GAS, ED
Acid-base deficit: 2 mmol/L (ref 0.0–2.0)
Bicarbonate: 21.1 mmol/L (ref 20.0–28.0)
Calcium, Ion: 1.11 mmol/L — ABNORMAL LOW (ref 1.15–1.40)
HCT: 33 % — ABNORMAL LOW (ref 39.0–52.0)
Hemoglobin: 11.2 g/dL — ABNORMAL LOW (ref 13.0–17.0)
O2 Saturation: 97 %
Potassium: 3.8 mmol/L (ref 3.5–5.1)
Sodium: 143 mmol/L (ref 135–145)
TCO2: 22 mmol/L (ref 22–32)
pCO2, Ven: 31 mmHg — ABNORMAL LOW (ref 44.0–60.0)
pH, Ven: 7.44 — ABNORMAL HIGH (ref 7.250–7.430)
pO2, Ven: 82 mmHg — ABNORMAL HIGH (ref 32.0–45.0)

## 2021-10-12 LAB — URINALYSIS, COMPLETE (UACMP) WITH MICROSCOPIC
Bilirubin Urine: NEGATIVE
Glucose, UA: NEGATIVE mg/dL
Hgb urine dipstick: NEGATIVE
Ketones, ur: NEGATIVE mg/dL
Nitrite: NEGATIVE
Protein, ur: 30 mg/dL — AB
Specific Gravity, Urine: 1.02 (ref 1.005–1.030)
pH: 5.5 (ref 5.0–8.0)

## 2021-10-12 LAB — PHOSPHORUS: Phosphorus: 3.9 mg/dL (ref 2.5–4.6)

## 2021-10-12 LAB — PROCALCITONIN: Procalcitonin: <0.1 ng/mL

## 2021-10-12 LAB — LACTIC ACID, PLASMA
Lactic Acid, Venous: 2 mmol/L (ref 0.5–1.9)
Lactic Acid, Venous: 2.7 mmol/L (ref 0.5–1.9)
Lactic Acid, Venous: 2 mmol/L (ref 0.5–1.9)

## 2021-10-12 LAB — BRAIN NATRIURETIC PEPTIDE: B Natriuretic Peptide: 154.4 pg/mL — ABNORMAL HIGH (ref 0.0–100.0)

## 2021-10-12 LAB — SODIUM, URINE, RANDOM: Sodium, Ur: 80 mmol/L

## 2021-10-12 LAB — CREATININE, URINE, RANDOM: Creatinine, Urine: 79.24 mg/dL

## 2021-10-12 LAB — MAGNESIUM: Magnesium: 2.2 mg/dL (ref 1.7–2.4)

## 2021-10-12 LAB — STREP PNEUMONIAE URINARY ANTIGEN: Strep Pneumo Urinary Antigen: NEGATIVE

## 2021-10-12 MED ORDER — SODIUM CHLORIDE 0.9 % IV SOLN
500.0000 mg | INTRAVENOUS | Status: AC
  Administered 2021-10-12 – 2021-10-16 (×5): 500 mg via INTRAVENOUS
  Filled 2021-10-12 (×6): qty 500

## 2021-10-12 MED ORDER — IPRATROPIUM-ALBUTEROL 0.5-2.5 (3) MG/3ML IN SOLN
3.0000 mL | Freq: Four times a day (QID) | RESPIRATORY_TRACT | Status: DC
  Administered 2021-10-12 – 2021-10-13 (×4): 3 mL via RESPIRATORY_TRACT
  Filled 2021-10-12 (×4): qty 3

## 2021-10-12 MED ORDER — ALBUTEROL SULFATE (2.5 MG/3ML) 0.083% IN NEBU: 2.5000 mg | INHALATION_SOLUTION | RESPIRATORY_TRACT | Status: DC | PRN

## 2021-10-12 MED ORDER — ACETAMINOPHEN 650 MG RE SUPP
650.0000 mg | Freq: Four times a day (QID) | RECTAL | Status: DC | PRN
  Administered 2021-10-14: 650 mg via RECTAL
  Filled 2021-10-12 (×2): qty 1

## 2021-10-12 MED ORDER — TAMSULOSIN HCL 0.4 MG PO CAPS
0.4000 mg | ORAL_CAPSULE | Freq: Every day | ORAL | Status: DC
  Administered 2021-10-12: 0.4 mg via ORAL
  Filled 2021-10-12: qty 1

## 2021-10-12 MED ORDER — ACETAMINOPHEN 650 MG RE SUPP: 650.0000 mg | Freq: Once | RECTAL | Status: DC

## 2021-10-12 MED ORDER — ARIPIPRAZOLE 5 MG PO TABS
5.0000 mg | ORAL_TABLET | Freq: Every day | ORAL | Status: DC
  Administered 2021-10-12: 5 mg via ORAL
  Filled 2021-10-12 (×3): qty 1

## 2021-10-12 MED ORDER — CLOPIDOGREL BISULFATE 75 MG PO TABS
75.0000 mg | ORAL_TABLET | Freq: Every day | ORAL | Status: DC
  Administered 2021-10-12 – 2021-10-13 (×2): 75 mg via ORAL
  Filled 2021-10-12 (×2): qty 1

## 2021-10-12 MED ORDER — ACETAMINOPHEN 325 MG PO TABS: 650.0000 mg | ORAL_TABLET | Freq: Four times a day (QID) | ORAL | Status: DC | PRN

## 2021-10-12 MED ORDER — SODIUM CHLORIDE 0.9 % IV SOLN: INTRAVENOUS | Status: DC

## 2021-10-12 MED ORDER — ASPIRIN 81 MG PO CHEW
81.0000 mg | CHEWABLE_TABLET | Freq: Every day | ORAL | Status: DC
  Administered 2021-10-12 – 2021-10-13 (×2): 81 mg via ORAL
  Filled 2021-10-12 (×2): qty 1

## 2021-10-12 MED ORDER — SODIUM CHLORIDE 0.9 % IV SOLN
2.0000 g | INTRAVENOUS | Status: AC
  Administered 2021-10-12 – 2021-10-16 (×5): 2 g via INTRAVENOUS
  Filled 2021-10-12 (×5): qty 20

## 2021-10-12 NOTE — ED Notes (Signed)
Spoke w RN Leavy Cella to confirm OK to send pt. Opportunity for questions provided.

## 2021-10-12 NOTE — Progress Notes (Signed)
Patient seen and examined personally, I reviewed the chart, history and physical and admission note, done by admitting physician this morning and agree with the same with following addendum.  Please refer to the morning admission note for more detailed plan of care.  Briefly,  87 year old male with chronic diastolic CHF, dementia, questionable history of COPD most recent PFT in June 2015 FEV1/FVC of 0.8 on 2 L nasal cannula as needed,  BPH presented with progressive shortness of breath over the last 1 to 2 days, without chest pain nausea vomiting but having cough, no dysuria. In the ED febrile one 101.5 for tachycardia tachypnea saturating 87-95% on 2 L.  Patient was exhibiting significant mount proving placed on nonrebreather, labs with creatinine 1.9 no previous study available but was 1.1 in 2015, BNP 154, leukocytosis 13.4 initial lactic acidosis 2.2 COVID-19 negative blood cultures sent, chest x-ray with right perihilar airspace opacity concerning for pneumonia differential includes edema versus atelectasis.Patient given fluids antibiotics and admitted for severe sepsis and hypoxia  This morning in the ED blood pressure 90s to 103, hr 70, rr 17-26 Seen and he just finished eating, nursing reports she was able to wean down from NRB to nasal cannula to 6 L. He is very hard of hearing reports he has bouts of cough able to speak does not appear to be in distress  Afebrile this morning on admission on 1.5  A/P  Severe sepsis POA Right-sided pneumonia Wbc 13.4, procal < 0.1 Abg PCO2 31  -02 81 Lactic acidosis 2.2> 2.7 Continue ceftriaxone azithromycin, antitussives, monitor respiratory status closely Blood pressure soft and with lactic acidosis - repeat bp improved in 101-140s  Recent Labs  Lab 10/12/21 0128 10/12/21 0542  WBC 13.4* 13.4*  LATICACIDVEN 2.0* 2.7*  PROCALCITON  --  <0.10    Acute hypoxic respiratory failure due to pneumonia and sepsis. Continue supplemental oxygen and wean  as tolerated.  Mouth breather-needing nonrebreather overnight.  Pyuria abnormal UA with large leukocytes, WBC 21-50, concerning for UTI on antibiotics see above   Elevated creatinine 1.9> 1.7 Metabolic acidosis and bicarb 20 AKI improving.  Monitor.   Chronic diastolic CHF stable BNP 154 Monitor volume status. On ASA/PLAVIX  BPH: On Flomax  Advanced age Very hard of hearing GOC: Continue supportive care delirium precaution, continue Abilify.  Palliative care consulted in the setting of acute illness advanced age.

## 2021-10-12 NOTE — ED Notes (Signed)
Less noisy breathing at present

## 2021-10-12 NOTE — ED Notes (Signed)
The pt has a congested cough

## 2021-10-12 NOTE — Sepsis Progress Note (Signed)
Following per sepsis protocol   

## 2021-10-12 NOTE — H&P (Signed)
History and Physical    PLEASE NOTE THAT DRAGON DICTATION SOFTWARE WAS USED IN THE CONSTRUCTION OF THIS NOTE.   Andrew Frey NFA:[id number] DOB: 1921-11-16 DOA: 10/11/2021  PCP: Ernestene Kiel, MD  Patient coming from: ALF [facility])  I have personally briefly reviewed patient's old medical records in [place]  Chief Complaint: sob  HPI: Andrew Frey is a 87 y.o. male with medical history significant for chronic diastolic heart failure, dementia, BPH, who is admitted to [facility] on 10/11/2021 with severe sepsis due to suspected pneumonia after presenting from ALF to [facility] ED complaining of sob.   In the setting of patient's dementia, following history is obtained via discussions with the EDP as well as via chart review.  The patient presents to [facility] ED for further evaluation of progressive shortness of breath over the last 1 to 2 days.  He denies any associated chest pain, nausea, vomiting.  He reports cough, but is unsure if this is reactive versus non-productive.  Denies any dysuria.  Per chart review, there is a questionable history of COPD, although most recent pulmonary function tests occurring in June 2015.  Demonstrate FEV1 over FVC of 0.82.  He is on 2 L nasal cannula prn supplemental oxygen in the absence of any recorded scheduled supplemental O2.  Outpatient respiratory regimen includes scheduled albuterol nebulizer 3 times daily as well as mometasone nightly.  Per chart review, there is also documentation of a history of chronic diastolic heart failure, although no recent echocardiogram results identified at this time.  Not on any scheduled diuretic medications as an outpatient.     ED Course:  Vital signs in the ED were notable for the following:  Temperature max 101.5; heart rate 84-91; blood pressure 96/57-120/65; respiratory rate 18-23, oxygen saturation initially 87-95 on 2 L nasal cannula; for the patient was observed to exhibit significant mouth  breathing, prompting placement of nonrebreather at 15 L/min, with ensuing O2 sats noted to be 98 to 99%.  Labs were notable for the following: CMP notable for the following sodium 140, BUN 35, creatinine 1.92 relative to the only prior creatinine data point available in ER EMR, which was noted to be 1.1 in June 2015, liver enzymes within normal limits.  BNP 154, without prior BNP data point available for point comparison.  CBC notable for the following: White blood cell count 13,400 with 95% neutrophils, hemoglobin 11.3.  Initial lactate 2.0.  COVID-19/influenza PCR performed in the ED today and found to be negative.  Blood cultures x2 were collected prior to initiation of IV antibiotics.  Imaging and additional notable ED work-up: EKG showed sinus tachycardia with heart rate 97, PACs, normal intervals, no evidence of T wave or ST changes, including no evidence of ST elevation.  Chest x-ray showed right perihilar airspace opacity concerning for pneumonia, although differential also included edema versus atelectasis, will demonstrating no evidence of pleural effusion or pneumothorax.   While in the ED, the following were administered: Tylenol 650 mg x 1, normal saline 75 cc/h x 2 hours, azithromycin, Rocephin.     Review of Systems: As per HPI otherwise 10 point review of systems negative.   Past Medical History:  Diagnosis Date   Anemia, unspecified    BPH (benign prostatic hyperplasia)    CAD (coronary artery disease)    Calculus of gallbladder without mention of cholecystitis or obstruction    DDD (degenerative disc disease)    DJD (degenerative joint disease)  Dyspnea    HLD (hyperlipidemia)    HTN (hypertension)    Low testosterone    Lower back pain    MI (myocardial infarction) ([place])    Osteoarthritis    Other testicular hypofunction    Spinal stenosis     Past Surgical History:  Procedure Laterality Date   APPENDECTOMY  1965   stent placed  2013   TOTAL HIP ARTHROPLASTY      TOTAL KNEE ARTHROPLASTY  2007   left   TOTAL KNEE ARTHROPLASTY  2005   right    Social History:  reports that he has never smoked. He has never used smokeless tobacco. He reports that he does not drink alcohol and does not use drugs.   No Known Allergies  Family History  Problem Relation Age of Onset   Cancer Father    Hypertension Mother    Other Mother        cerebral hemorrhage   Heart attack Other        sibling   Heart disease Other        sibling   Hypertension Other        child   Stroke Mother     Prior to Admission medications   Medication Sig Start Date End Date Taking? Authorizing Provider  acetaminophen (TYLENOL) 325 MG tablet Take 325 mg by mouth 3 (three) times daily as needed for mild pain.   Yes [provider]  albuterol (PROVENTIL) (2.5 MG/3ML) 0.083% nebulizer solution Take 2.5 mg by nebulization 3 (three) times daily.   Yes [provider]  albuterol (VENTOLIN HFA) 108 (90 Base) MCG/ACT inhaler Inhale 2 puffs into the lungs every 4 (four) hours as needed for wheezing or shortness of breath.   Yes [provider]  amoxicillin (AMOXIL) 500 MG capsule Take 2,000 mg by mouth See admin instructions. 1 hour prior to dental appointment   Yes [provider]  anti-nausea (EMETROL) solution Take 15 mLs by mouth as needed for nausea.   Yes [provider]  ARIPiprazole (ABILIFY) 5 MG tablet Take 5 mg by mouth at bedtime.   Yes [provider]  ARTIFICIAL TEAR OP Place 2 drops into both eyes in the morning and at bedtime.   Yes [provider]  aspirin 81 MG chewable tablet Take 81 mg by mouth daily.   Yes [provider]  B Complex Vitamins (VITAMIN-B COMPLEX PO) Take 1 tablet by mouth daily.   Yes [provider]  clopidogrel (PLAVIX) 75 MG tablet Take 75 mg by mouth daily with breakfast.   Yes [provider]  dextromethorphan (DELSYM) 30 MG/5ML liquid Take 10 mLs by mouth 2  (two) times daily as needed for cough.   Yes [provider]  lidocaine (LIDODERM) 5 % Place 1 patch onto the skin daily. Remove & Discard patch within 12 hours or as directed by MD   Yes [provider]  loperamide (IMODIUM A-D) 2 MG tablet Take 2-4 mg by mouth See admin instructions. 4 mg after first loose stool, then 2 mg after each loose stool ** max 4 tabs in 24 hours **   Yes [provider]  meloxicam (MOBIC) 7.5 MG tablet Take 2 tablets by mouth daily.   Yes [provider]  Menthol, Topical Analgesic, (BIOFREEZE) 4 % GEL Apply 1 application topically 2 (two) times daily as needed (shoulder pain).   Yes [provider]  mometasone (ASMANEX, 30 METERED DOSES,) 220 MCG/ACT inhaler Inhale  2 puffs into the lungs every evening.   Yes [provider]  OVER THE COUNTER MEDICATION Apply 1 application topically See admin instructions. CBD pain stick apply to area of pain  on shoulder 3 times daily as needed for pain   Yes [provider]  OVER THE COUNTER MEDICATION Take 2 tablets by mouth in the morning and at bedtime. Juice Plus Garden Chew Gummies   Yes [provider]  OVER THE COUNTER MEDICATION Take 2 tablets by mouth in the morning and at bedtime. Juice Plus Orchard Chews   Yes [provider]  OVER THE COUNTER MEDICATION Take 2 tablets by mouth in the morning and at bedtime. Juice Plus Vineyard chews   Yes [provider]  oxyCODONE-acetaminophen (PERCOCET) 10-325 MG per tablet Take 1 tablet by mouth 4 (four) times daily as needed.    Yes [provider]  Polyethyl Glycol-Propyl Glycol (SYSTANE) 0.4-0.3 % GEL ophthalmic gel Place 1 application into both eyes in the morning and at bedtime.   Yes [provider]  sennosides-docusate sodium (SENOKOT-S) 8.6-50 MG tablet Take 1 tablet by mouth in the morning and at bedtime.   Yes [provider]  sertraline (ZOLOFT) 50 MG tablet Take 50  mg by mouth daily.   Yes [provider]  tamsulosin (FLOMAX) 0.4 MG CAPS capsule Take 0.4 mg by mouth at bedtime.   Yes [provider]     Objective    Physical Exam: Vitals:   10/12/21 0130 10/12/21 0145 10/12/21 0200 10/12/21 0215  BP: 119/65 120/65 132/65 109/64  Pulse: 87 92 91 87  Resp: 18 (!) 23 20 (!) 22  Temp:      TempSrc:      SpO2: 93% 93% 97% 98%    General: appears to be stated age; alert, confused Skin: warm, dry, no rash Head:  AT/[place] Mouth:  Oral mucosa membranes appear moist, normal dentition Neck: supple; trachea midline Heart:  RRR; did not appreciate any M/R/G Lungs: right sided rales noted; otherwise CTAB, did not appreciate any wheezes, or rhonchi Abdomen: + BS; soft, ND, NT Vascular: 2+ pedal pulses b/l; 2+ radial pulses b/l Extremities: no peripheral edema, no muscle wasting Neuro: strength and sensation intact in upper and lower extremities b/l   Labs on Admission: I have personally reviewed following labs and imaging studies  CBC: Recent Labs  Lab 10/12/21 0128  WBC 13.4*  NEUTROABS 12.7*  HGB 11.3*  HCT 35.8*  MCV 98.4  PLT 629   Basic Metabolic Panel: Recent Labs  Lab 10/12/21 0128  NA 141  K 3.7  CL 109  CO2 21*  GLUCOSE 179*  BUN 35*  CREATININE 1.92*  CALCIUM 9.3   GFR: CrCl cannot be calculated (Unknown ideal weight.). Liver Function Tests: Recent Labs  Lab 10/12/21 0128  AST 16  ALT 13  ALKPHOS 38  BILITOT 0.6  PROT 6.6  ALBUMIN 3.7   No results for input(s): LIPASE, AMYLASE in the last 168 hours. No results for input(s): AMMONIA in the last 168 hours. Coagulation Profile: No results for input(s): INR, PROTIME in the last 168 hours. Cardiac Enzymes: No results for input(s): CKTOTAL, CKMB, CKMBINDEX, TROPONINI in the last 168 hours. BNP (last 3 results) No results for input(s): PROBNP in the last 8760 hours. HbA1C: No results for input(s): HGBA1C in the last 72 hours. CBG: No results  for input(s): GLUCAP in the last 168 hours. Lipid Profile: No results for input(s): CHOL, HDL, LDLCALC, TRIG,  CHOLHDL, LDLDIRECT in the last 72 hours. Thyroid Function Tests: No results for input(s): TSH, T4TOTAL, FREET4, T3FREE, THYROIDAB in the last 72 hours. Anemia Panel: No results for input(s): VITAMINB12, FOLATE, FERRITIN, TIBC, IRON, RETICCTPCT in the last 72 hours. Urine analysis: No results found for: COLORURINE, APPEARANCEUR, LABSPEC, [place], GLUCOSEU, [place], BILIRUBINUR, KETONESUR, PROTEINUR, UROBILINOGEN, NITRITE, LEUKOCYTESUR  Radiological Exams on Admission: DG Chest 2 View  Result Date: 10/12/2021 CLINICAL DATA:  Shortness of breath. EXAM: CHEST - 2 VIEW COMPARISON:  Chest radiograph dated 02/20/2007. FINDINGS: Evaluation is limited due to patient's positioning. Right perihilar densities may represent edema, atelectasis, or pneumonia. There are bibasilar atelectasis. No large pleural effusion or pneumothorax. Stable cardiac silhouette. Moderate size hiatal hernia. Osteopenia with degenerative changes of the spine. Multilevel chronic compression fractures. No acute osseous pathology. IMPRESSION: Right perihilar densities may represent edema, atelectasis, or pneumonia. Electronically Signed   By: Anner Crete M.D.   On: 10/12/2021 00:00     EKG: Independently reviewed, with result as described above.    Assessment/Plan   Principal Problem:   Severe sepsis (HCC) Active Problems:   BPH (benign prostatic hyperplasia)   CAP (community acquired pneumonia)   SOB (shortness of breath)   Lactic acidosis   Fever   Elevated serum creatinine    #) Severe sepsis due to right-sided pneumonia: Diagnosis on the basis of presenting shortness of breath, cough, chest x-ray showing evidence of right perihilar airspace opacity concerning for pneumonia, with SIRS criteria met via planning objective fever, leukocytosis with neutrophilic predominance, mild tachycardia, tachypnea. Of note,  given the associated presence of suspected end organ damage in the form of concominant presenting elevated initial lactate of 2.0, criteria are met for pt's sepsis to be considered severe in nature. However, in the absence of lactic acid level that is greater than or equal to 4.0, and in the absence of any associated hypotension refractory to IVF's, there are no indications for administration of a 30 mL/kg IVF bolus at this time.  Treating as community-acquired pneumonia, with Rocephin and azithromycin initiated in the ED after collection of blood cultures x2.  No e/o additional underlying infectious process at this time, including COVID-19/influenza PCR checked in the ED today, which were negative.  We will further evaluate checking UA.     Plan: CBC w/ diff in AM.  Repeat lactic acid ordered.  Follow-up results of blood cultures x2.  Check procalcitonin.  Azithromycin/Rocephin, as above.  Add on procalcitonin.  Check urinalysis.  Prn acetaminophen for fever.  Strep urine antigen.  Flutter valve.  Given questionable history of COPD, have also ordered scheduled duo nebs as well as as needed albuterol nebulizers.  Check VBG.      #) Elevated creatinine: Presenting serum creatinine noted to be 1.19, with most recent prior value occurring over 7 years ago, at which time creatinine 1.1 in June 2015.  Unclear if presenting creatinine represents an acute kidney injury, or interval development of chronic kidney disease with limited interval data points.  In the setting of documentation of history of chronic diastolic heart failure, patient received gentle IV fluids in the ED in the form of normal saline at 75 cc/h x 2 hours.  We will check urinalysis with microscopy and closely monitor interval renal function via BMP in the morning.  Urinalysis with microscopy.  Monitor strict I's and O's and daily weights.  Tempt avoid nephrotoxic agents.  Add on random urine sodium as well as random urine creatinine.   Repeat BMP  in the morning.  Further evaluation management of severe sepsis due to pneumonia, as above.         #) Chronic diastolic heart failure: documented history of such, although no prior echocardiogram results identified thus far per chart review.  Clinically, presentation appears less suggestive of acute decompensated heart failure, with no significant BNP elevation at 154, although no prior available for comparison, while chest x-ray shows right perihilar airspace opacity, favoring without overt edema, pleural effusion.  Not on any scheduled diuretic medications as outpatient.  Plan: monitor strict I's & O's and daily weights. Repeat BMP in the morning. Check serum magnesium level.  Will attempt further chart review to evaluate any prior echocardiogram results.      #) Benign prostatic hyperplasia: Documented history of such: On tamsulosin as an outpatient  Plan: Resume home tamsulosin.  Monitor strict I's and O's and daily weights.  Repeat BMP in the morning.      DVT prophylaxis: SCD's   Code Status: Full code Family Communication: none Disposition Plan: Per Rounding Team Consults called: none;  Admission status: inpatient; pcu  Warrants inpatient status on basis of requiring further evaluation and management this year sepsis due to suspected pneumonia requiring ongoing IV antibiotics as well as further evaluation for any additional sources of underlying infection, while closely monitoring considering hemodynamic stability as well as close monitoring supplemental oxygen requirements.    PLEASE NOTE THAT DRAGON DICTATION SOFTWARE WAS USED IN THE CONSTRUCTION OF THIS NOTE.   [place] DO [facility]  From [place]   10/12/2021, 3:37 AM

## 2021-10-12 NOTE — Progress Notes (Signed)
This chaplain responded to PMT consult for spiritual care. The chaplain is updated before the visit by the RN.    The chaplain begins building rapport noting the Pt. is very hard of hearing with dementia. The Pt. is comfortable with the chaplain's bedside presence and communicates appropriately.  The chaplain understands the Pt. is looking for some [employer] truck parts in [place]. The  chaplain assured him the parts will be safe.   The chaplain understands the Pt. will be admitted when a bed is available. This chaplain will F/U with spiritual care on Friday.   Chaplain Stephanie Acre [phone]

## 2021-10-12 NOTE — Consult Note (Signed)
Consultation Note Date: 10/12/2021   Patient Name: Andrew Frey  DOB: 04-22-1922  MRN: [id number]  Age / Sex: 87 y.o., male  PCP: Ernestene Kiel, MD Referring Physician: Antonieta Pert, MD  Reason for Consultation: Establishing goals of care  HPI/Patient Profile: 87 y.o. male  with past medical history of chronic diastolic heart failure, dementia, BPH, and questionable COPD presented to ED on 10/11/21 from [place] [facility] with respiratory distress. Patient was admitted on 10/11/2021 with severe sepsis secondary to pneumonia and elevated creatinine.   Clinical Assessment and Goals of Care: I have reviewed medical records including EPIC notes, labs, and imaging. Received report from primary RN - no acute concerns.   Went to visit patient at bedside - daughter/[name] present. Patient was lying in bed awake, alert, oriented, and able to participate in conversation; however, is very [name] and most of the conversation was had through his daughter. No signs or non-verbal gestures of pain or discomfort noted. No respiratory distress, increased work of breathing, or secretions noted. Patient denies pain or shortness of breath. He does desaturate with drinking water and talking as noted on monitor throughout visit. He has a congested, non-productive cough.  Met with daughter/patient  to discuss diagnosis, prognosis, GOC, EOL wishes, disposition, and options.  I introduced Palliative Medicine as specialized medical care for people living with serious illness. It focuses on providing relief from the symptoms and stress of a serious illness. The goal is to improve quality of life for both the patient and the family.  We discussed a brief life review of the patient as well as functional and nutritional status. He wife has passed away - they had one daughter and one son; however, the son is not in contact with  patient per [name]. Prior to hospitalization, patient was living at [facility] in [place]. [name] is able to visit him there every 2-3 days. He is able to ambulate short distances with a walker. [name] states he had a good appetite until she noticed a decline the night prior to his admission. She noticed he had progressive weakness and wasn't "acting himself." She does state he has sundowners with increased agitation at night. She reports he has been incontinent x several months now.  We discussed patient's current illness and what it means in the larger context of patient's on-going co-morbidities. Reviewed labwork per her request. [name] understands that dementia and COPD are progressive, non-curable disease underlying the patient's current acute medical conditions. Natural disease trajectory and expectations at EOL were discussed. I attempted to elicit values and goals of care important to the patient. The difference between aggressive medical intervention and comfort care was considered in light of the patient's goals of care. [name] states the patient still has a good quality of life. She explains he gets pneumonia "every 2-3 years" with his last hospitalization for it being in 2020. She wishes to continue current medical interventions at this time with goal for patient to return to [facility] as soon as possible.  We reviewed  anticipated progression of dementia and it's specific indicators of end stage dementia, including inability to communicate, bed bound/non-ambulatory status, decreased oral intake, swallowing dysfunction, and incontinence of bowel/bladder. At this time, patient is not at end stage dementia and may likely pass away of advanced age prior to reaching end stages.  Hospice and Palliative Care services outpatient were explained and offered. [name] is not interested in hospice services at this time, which is reasonable as patient does seem to be doing very well to be  87yo and they would like him rehospitalized if indicated. She will speak with social workers at [facility] about initiating outpatient [place] - she is considering it for now and does not wish for [place] to initiate referral.   Encouraged patient/family to consider DNR/DNI status understanding evidenced based poor outcomes in similar hospitalized patient, as the cause of arrest is likely associated with advanced chronic/terminal illness rather than an easily reversible acute cardio-pulmonary event. I explained that DNR/DNI does not change the medical plan and it only comes into effect after a person has arrested (died).  It is a protective measure to keep [place] from harming the patient in their last moments of life. Patient/Family were not agreeable to DNR/DNI with understanding that he would receive CPR, defibrillation, ACLS medications, and/or intubation. [name] would like to review his advanced directive documents at home prior to changing code status - she feels he has indicated his desire for DNR/DNI on those documents, but does not wish to change code status today. Reviewed that patient is at high risk for respiratory decline and encouraged her to review documents and notify medical team with any changes as soon as possible - she expressed understanding.  Advance directives and rehospitalization were considered and discussed. [name] states she is patient's HCPOA - requested copy.  Offered chaplain support - patient/family agreeable and would appreciate prayer and emotional support. Patient is of [employer].  Discussed with patient/family the importance of continued conversation with each other and the medical providers regarding overall plan of care and treatment options, ensuring decisions are within the context of the patient's values and GOCs.    Questions and concerns were addressed. The patient/family was encouraged to call with questions and/or concerns. PMT card was  provided.    Primary Decision Maker PATIENT with assistance of daughter/HCPOA/[name] Marshell Levan - do not have copies of HCPOA documents    SUMMARY OF RECOMMENDATIONS   Continue current medical treatment Continue full code - patient/family considering DNR/DNI, will notify medical team if they would like to change code status Plan on discharge back to [facility] consulted for: patient/family request for prayer and emotional support Daughter to bring HCPOA - please obtain copy if able Patient/family considering outpatient Palliative Care - will discuss and follow up with Social Worker at [facility] PMT will continue to follow peripherally. If there are any imminent needs please call the service directly  Code Status/Advance Care Planning: Full code  Palliative Prophylaxis:  Aspiration, Bowel Regimen, Delirium Protocol, Frequent Pain Assessment, Oral Care, and Turn Reposition  Additional Recommendations (Limitations, Scope, Preferences): Full Scope Treatment  Psycho-social/Spiritual:  Desire for further Chaplaincy support:yes Created space and opportunity for patient and family to express thoughts and feelings regarding patient's current medical situation.  Emotional support and therapeutic listening provided.  Prognosis:  Unable to determine  Discharge Planning: back to [facility]     Primary Diagnoses: Present on Admission:  Severe sepsis ([place])  CAP (community acquired pneumonia)  SOB (shortness of breath)  Lactic acidosis  Fever  Elevated serum creatinine  BPH (benign prostatic hyperplasia)   I have reviewed the medical record, interviewed the patient and family, and examined the patient. The following aspects are pertinent.  Past Medical History:  Diagnosis Date   Anemia, unspecified    BPH (benign prostatic hyperplasia)    CAD (coronary artery disease)    Calculus of gallbladder without mention of cholecystitis or obstruction    DDD  (degenerative disc disease)    DJD (degenerative joint disease)    Dyspnea    HLD (hyperlipidemia)    HTN (hypertension)    Low testosterone    Lower back pain    MI (myocardial infarction) ([place])    Osteoarthritis    Other testicular hypofunction    Spinal stenosis    Social History   Socioeconomic History   Marital status: Widowed    Spouse name: Not on file   Number of children: 2   Years of education: Not on file   Highest education level: Not on file  Occupational History   Occupation: retired    Comment: truck Geophysicist/field seismologist, agriculture   Tobacco Use   Smoking status: Never   Smokeless tobacco: Never  Substance and Sexual Activity   Alcohol use: No   Drug use: No   Sexual activity: Not on file  Other Topics Concern   Not on file  Social History Narrative   Not on file   Social Determinants of Health   Financial Resource Strain: Not on file  Food Insecurity: Not on file  Transportation Needs: Not on file  Physical Activity: Not on file  Stress: Not on file  Social Connections: Not on file   Family History  Problem Relation Age of Onset   Cancer Father    Hypertension Mother    Other Mother        cerebral hemorrhage   Heart attack Other        sibling   Heart disease Other        sibling   Hypertension Other        child   Stroke Mother    Scheduled Meds:  acetaminophen  650 mg Rectal Once   ARIPiprazole  5 mg Oral QHS   aspirin  81 mg Oral Daily   clopidogrel  75 mg Oral Daily   ipratropium-albuterol  3 mL Nebulization Q6H   tamsulosin  0.4 mg Oral QHS   Continuous Infusions:  azithromycin Stopped (10/12/21 0308)   cefTRIAXone (ROCEPHIN)  IV Stopped (10/12/21 0206)   PRN Meds:.acetaminophen **OR** acetaminophen, albuterol Medications Prior to Admission:  Prior to Admission medications   Medication Sig Start Date End Date Taking? Authorizing Provider  acetaminophen (TYLENOL) 325 MG tablet Take 325 mg by mouth 3 (three) times daily as needed for  mild pain.   Yes [provider]  albuterol (PROVENTIL) (2.5 MG/3ML) 0.083% nebulizer solution Take 2.5 mg by nebulization 3 (three) times daily.   Yes [provider]  albuterol (VENTOLIN HFA) 108 (90 Base) MCG/ACT inhaler Inhale 2 puffs into the lungs every 4 (four) hours as needed for wheezing or shortness of breath.   Yes [provider]  amoxicillin (AMOXIL) 500 MG capsule Take 2,000 mg by mouth See admin instructions. 1 hour prior to dental appointment   Yes [provider]  anti-nausea (EMETROL) solution Take 15 mLs by mouth as needed for nausea.   Yes [provider]  ARIPiprazole (ABILIFY) 5 MG tablet Take 5 mg by mouth  at bedtime.   Yes [provider]  ARTIFICIAL TEAR OP Place 2 drops into both eyes in the morning and at bedtime.   Yes [provider]  aspirin 81 MG chewable tablet Take 81 mg by mouth daily.   Yes [provider]  B Complex Vitamins (VITAMIN-B COMPLEX PO) Take 1 tablet by mouth daily.   Yes [provider]  clopidogrel (PLAVIX) 75 MG tablet Take 75 mg by mouth daily with breakfast.   Yes [provider]  dextromethorphan (DELSYM) 30 MG/5ML liquid Take 10 mLs by mouth 2 (two) times daily as needed for cough.   Yes [provider]  lidocaine (LIDODERM) 5 % Place 1 patch onto the skin daily. Remove & Discard patch within 12 hours or as directed by MD   Yes [provider]  loperamide (IMODIUM A-D) 2 MG tablet Take 2-4 mg by mouth See admin instructions. 4 mg after first loose stool, then 2 mg after each loose stool ** max 4 tabs in 24 hours **   Yes [provider]  meloxicam (MOBIC) 7.5 MG tablet Take 2 tablets by mouth daily.   Yes [provider]  Menthol, Topical Analgesic, (BIOFREEZE) 4 % GEL Apply 1 application topically 2 (two) times daily as needed (shoulder pain).   Yes [provider]  mometasone (ASMANEX, 30 METERED DOSES,) 220  MCG/ACT inhaler Inhale 2 puffs into the lungs every evening.   Yes [provider]  OVER THE COUNTER MEDICATION Apply 1 application topically See admin instructions. CBD pain stick apply to area of pain  on shoulder 3 times daily as needed for pain   Yes [provider]  OVER THE COUNTER MEDICATION Take 2 tablets by mouth in the morning and at bedtime. Juice Plus Garden Chew Gummies   Yes [provider]  OVER THE COUNTER MEDICATION Take 2 tablets by mouth in the morning and at bedtime. Juice Plus Orchard Chews   Yes [provider]  OVER THE COUNTER MEDICATION Take 2 tablets by mouth in the morning and at bedtime. Juice Plus Vineyard chews   Yes [provider]  oxyCODONE-acetaminophen (PERCOCET) 10-325 MG per tablet Take 1 tablet by mouth 4 (four) times daily as needed.    Yes [provider]  Polyethyl Glycol-Propyl Glycol (SYSTANE) 0.4-0.3 % GEL ophthalmic gel Place 1 application into both eyes in the morning and at bedtime.   Yes [provider]  sennosides-docusate sodium (SENOKOT-S) 8.6-50 MG tablet Take 1 tablet by mouth in the morning and at bedtime.   Yes [provider]  sertraline (ZOLOFT) 50 MG tablet Take 50 mg by mouth daily.   Yes [provider]  tamsulosin (FLOMAX) 0.4 MG CAPS capsule Take 0.4 mg by mouth at bedtime.   Yes [provider]   No Known Allergies Review of Systems  Constitutional:  Positive for activity change and fatigue.  Respiratory:  Positive for cough and shortness of breath.   Gastrointestinal:  Negative for nausea and vomiting.  Neurological:  Positive for weakness.  All other systems reviewed and are negative.  Physical Exam Vitals and nursing note reviewed.  Constitutional:      General: He is not in acute distress.    Appearance: He is ill-appearing.  Pulmonary:     Effort: No respiratory distress.     Comments: Congested, non-productive cough Skin:     General: Skin is warm and dry.  Neurological:     Mental Status: He is  alert and oriented to person, place, and time. Mental status is at baseline.     Motor: Weakness present.  Psychiatric:        Attention and Perception: Attention normal.        Behavior: Behavior is cooperative.        Cognition and Memory: Cognition and memory normal.     Comments: Very HOH which makes communication difficult    Vital Signs: BP (!) 142/73   Pulse 74   Temp 97.8 F (36.6 C)   Resp 18   SpO2 93%          SpO2: SpO2: 93 % O2 Device:SpO2: 93 % O2 Flow Rate: .O2 Flow Rate (L/min): 15 L/min  IO: Intake/output summary: No intake or output data in the 24 hours ending 10/12/21 1235  LBM:   Baseline Weight:   Most recent weight:       Palliative Assessment/Data: PPS 30-40%     Time In: 1245 Time Out: 1400 Time Total: 75 minutes  Greater than 50%  of this time was spent counseling and coordinating care related to the above assessment and plan.  Signed by: Lin Landsman, NP   Please contact Palliative Medicine Team phone at [phone] for questions and concerns.  For individual provider: See Shea Evans

## 2021-10-13 ENCOUNTER — Inpatient Hospital Stay (HOSPITAL_COMMUNITY): Payer: Medicare Other

## 2021-10-13 DIAGNOSIS — A419 Sepsis, unspecified organism: Secondary | ICD-10-CM | POA: Diagnosis not present

## 2021-10-13 DIAGNOSIS — J9601 Acute respiratory failure with hypoxia: Secondary | ICD-10-CM | POA: Diagnosis not present

## 2021-10-13 DIAGNOSIS — R6520 Severe sepsis without septic shock: Secondary | ICD-10-CM | POA: Diagnosis not present

## 2021-10-13 LAB — CBC
HCT: 32.3 % — ABNORMAL LOW (ref 39.0–52.0)
Hemoglobin: 10 g/dL — ABNORMAL LOW (ref 13.0–17.0)
MCH: 30 pg (ref 26.0–34.0)
MCHC: 31 g/dL (ref 30.0–36.0)
MCV: 97 fL (ref 80.0–100.0)
Platelets: 129 10*3/uL — ABNORMAL LOW (ref 150–400)
RBC: 3.33 MIL/uL — ABNORMAL LOW (ref 4.22–5.81)
RDW: 13.8 % (ref 11.5–15.5)
WBC: 11.9 10*3/uL — ABNORMAL HIGH (ref 4.0–10.5)
nRBC: 0 % (ref 0.0–0.2)

## 2021-10-13 LAB — BASIC METABOLIC PANEL
Anion gap: 7 (ref 5–15)
BUN: 31 mg/dL — ABNORMAL HIGH (ref 8–23)
CO2: 21 mmol/L — ABNORMAL LOW (ref 22–32)
Calcium: 9.1 mg/dL (ref 8.9–10.3)
Chloride: 111 mmol/L (ref 98–111)
Creatinine, Ser: 1.51 mg/dL — ABNORMAL HIGH (ref 0.61–1.24)
GFR, Estimated: 42 mL/min — ABNORMAL LOW (ref >60)
Glucose, Bld: 116 mg/dL — ABNORMAL HIGH (ref 70–99)
Potassium: 3.9 mmol/L (ref 3.5–5.1)
Sodium: 139 mmol/L (ref 135–145)

## 2021-10-13 LAB — BLOOD CULTURE ID PANEL (REFLEXED) - BCID2

## 2021-10-13 LAB — BLOOD GAS, ARTERIAL
Acid-base deficit: 1.8 mmol/L (ref 0.0–2.0)
Bicarbonate: 22.6 mmol/L (ref 20.0–28.0)
Drawn by: 519031
FIO2: 40
O2 Saturation: 96.1 %
Patient temperature: 37
pCO2 arterial: 39.2 mmHg (ref 32.0–48.0)
pH, Arterial: 7.378 (ref 7.350–7.450)
pO2, Arterial: 82.2 mmHg — ABNORMAL LOW (ref 83.0–108.0)

## 2021-10-13 LAB — GLUCOSE, CAPILLARY
Glucose-Capillary: 100 mg/dL — ABNORMAL HIGH (ref 70–99)
Glucose-Capillary: 103 mg/dL — ABNORMAL HIGH (ref 70–99)
Glucose-Capillary: 106 mg/dL — ABNORMAL HIGH (ref 70–99)

## 2021-10-13 LAB — MRSA NEXT GEN BY PCR, NASAL: MRSA by PCR Next Gen: DETECTED — AB

## 2021-10-13 MED ORDER — SODIUM CHLORIDE 0.9 % IV SOLN: INTRAVENOUS | Status: DC

## 2021-10-13 MED ORDER — IPRATROPIUM-ALBUTEROL 0.5-2.5 (3) MG/3ML IN SOLN
3.0000 mL | Freq: Two times a day (BID) | RESPIRATORY_TRACT | Status: DC
Start: 2021-10-14 — End: 2021-10-19
  Administered 2021-10-13 – 2021-10-19 (×12): 3 mL via RESPIRATORY_TRACT
  Filled 2021-10-13 (×12): qty 3

## 2021-10-13 MED ORDER — IPRATROPIUM-ALBUTEROL 0.5-2.5 (3) MG/3ML IN SOLN: 3.0000 mL | RESPIRATORY_TRACT | Status: DC | PRN

## 2021-10-13 MED ORDER — LORAZEPAM 2 MG/ML IJ SOLN
1.0000 mg | Freq: Once | INTRAMUSCULAR | Status: AC
  Administered 2021-10-13: 1 mg via INTRAVENOUS
  Filled 2021-10-13: qty 1

## 2021-10-13 MED ORDER — LORAZEPAM 2 MG/ML IJ SOLN
0.5000 mg | INTRAMUSCULAR | Status: DC | PRN
  Administered 2021-10-13 – 2021-10-16 (×3): 0.5 mg via INTRAVENOUS
  Filled 2021-10-13 (×3): qty 1

## 2021-10-13 MED ORDER — CHLORHEXIDINE GLUCONATE CLOTH 2 % EX PADS
6.0000 | MEDICATED_PAD | Freq: Every day | CUTANEOUS | Status: DC
  Administered 2021-10-13 – 2021-10-17 (×4): 6 via TOPICAL

## 2021-10-13 MED ORDER — ORAL CARE MOUTH RINSE
15.0000 mL | Freq: Two times a day (BID) | OROMUCOSAL | Status: DC
  Administered 2021-10-13 – 2021-10-17 (×7): 15 mL via OROMUCOSAL

## 2021-10-13 MED ORDER — GUAIFENESIN-DM 100-10 MG/5ML PO SYRP
5.0000 mL | ORAL_SOLUTION | ORAL | Status: DC | PRN
  Administered 2021-10-13 – 2021-10-18 (×6): 5 mL via ORAL
  Filled 2021-10-13 (×9): qty 5

## 2021-10-13 MED ORDER — HYDROXYZINE HCL 10 MG PO TABS
5.0000 mg | ORAL_TABLET | Freq: Three times a day (TID) | ORAL | Status: DC | PRN
  Filled 2021-10-13: qty 1

## 2021-10-13 NOTE — Progress Notes (Signed)
Patient transported from 5W to 2M11 with black pants, white shirt, blue plaid shirt, black shoes and pink blanket. All items kept at bedside.

## 2021-10-13 NOTE — Progress Notes (Addendum)
PROGRESS NOTE    Andrew Frey  [id number] DOB: [birth year] DOA: 10/11/2021 PCP: Ernestene Kiel, MD   Chief Complaint  Patient presents with   Shortness of Breath  Brief Narrative/Hospital Course: Andrew Frey, 87 y.o. male with PMH of chronic diastolic CHF, dementia, questionable history of COPD most recent PFT in June 2015 FEV1/FVC of 0.8 on 2 L nasal cannula as needed,  BPH presented with progressive shortness of breath over the last 1 to 2 days, without chest pain nausea vomiting but having cough, no dysuria. In the ED febrile one 101.5 for tachycardia tachypnea saturating 87-95% on 2 L.  Patient was exhibiting significant mount proving placed on nonrebreather, labs with creatinine 1.9 no previous study available but was 1.1 in 2015, BNP 154, leukocytosis 13.4 initial lactic acidosis 2.2 COVID-19 negative blood cultures sent, chest x-ray with right perihilar airspace opacity concerning for pneumonia differential includes edema versus atelectasis.Patient given fluids antibiotics and admitted for severe sepsis and hypoxia   Subjective: Seen and examined this morning. Sleeping but did wake up very hard of hearing confused, mittens in place but not in distress. On 4l [place] saturating in mid 90s Overnight has been afebrile but restless, hard of hearing confused Labs with improving renal function and improving WBC count  Assessment & Plan:  Severe sepsis POA Right-sided pneumonia Hemodynamically stable, afebrile WBC count improving lactic acid down trended to 2.2, blood culture BCID panel Staph species -clinically appears to be improving leukocytosis downtrending, positive culture 1/2 likely contamination repeat blood culture, continue on ceftriaxone azithromycin, cont antitussives, monitor respiratory status closely. Recent Labs  Lab 10/12/21 0128 10/12/21 0542 10/12/21 1816 10/13/21 0237  WBC 13.4* 13.4*  --  11.9*  LATICACIDVEN 2.0* 2.7* 2.0*  --   PROCALCITON  --  <0.10   --   --     Acute hypoxic respiratory failure due to pneumonia and sepsis. Also mouth breather, oxygen on 4 L doing well.  Wean as tolerated  Pyuria abnormal UA with large leukocytes, WBC 21-50, concerning for UTI on antibiotics see above   Elevated creatinine 123456 1.7 Metabolic acidosis and bicarb 20 AKI improving slowly, encourage oral intake minimize nephrotoxic medication.  Monitor.   Chronic diastolic CHF: stable BNP 123456, monitor volume status he is on aspirin Plavix continue  BPH: cont Flomax  Advanced age/Dementia at baseline Very hard of hearing GOC: Delirium/worsening of his confusion in the setting of acute respiratory illness hypoxia Palliative care following closely remains full code. Minimize sedation, can  try meds as tolerated, add atarax prn.  Addendum: at 4 pm Informed by nursing staff that patient was hypoxic pulse ox not picking up and saturating 80% on nonrebreather.  BiPAP was tried but unsuccessful.  Came and saw the patient at bedside.  New pulse oximetry placed on earlobe. pulse ox now picking up transitioned to Ventimask, patient is very hard of hearing but alert awake not in distress.  Requested PCCM pulmonary evaluation-ordered chest x-ray ABG.  DVT prophylaxis: SCDs Start: 10/12/21 [id number] he is on asa/plavix already - avoid chemical prophylaxi high risk for bleeding/fall  Code Status:   Code Status: Full Code Family Communication: plan of care discussed with patient,PMT. I called Andrew Frey- her daughter. She would like to continue the current treatment.  She endorses he gets pneumonia every couple years and gets confused but fairly stable at baseline in ALF  Status [id number] Remains inpatient appropriate because: For ongoing management of pneumonia and sepsis Disposition: Currently not medically stable for discharge. He  lives in [place]. Anticipated Disposition: SNF. PTOT eval today.  Objective: Vitals last 24 hrs: Vitals:   10/13/21 0500 10/13/21 0613  10/13/21 0750 10/13/21 0831  BP:  117/71 123/79   Pulse:  98 95   Resp:  (!) 22 (!) 24   Temp:  98.4 F (36.9 C)    TempSrc:  Oral    SpO2:  93% 94% 94%  Weight: 75.3 kg      Weight change:   Intake/Output Summary (Last 24 hours) at 10/13/2021 [id number] Last data filed at 10/13/2021 [id number] Gross per 24 hour  Intake 684.91 ml  Output 1200 ml  Net -515.09 ml   Net IO Since Admission: -515.09 mL [10/13/21 0837]   Physical Examination: General exam:Alert awake very hard of hearing confused, not in distress, on nasal cannula  HEENT:Oral mucosa moist, Ear/Nose WNL grossly,dentition normal. Respiratory system: B/l air entry present, no use of accessory muscle, non tender. Cardiovascular system: S1 & S2 +,No JVD. Gastrointestinal system: Abdomen soft, NT,ND, BS+. Nervous System:Alert, awake, moving extremities. Extremities: edema none, distal peripheral pulses palpable.  Skin: No rashes, no icterus. MSK: Normal muscle bulk, tone, power.  Medications reviewed:  Scheduled Meds:  acetaminophen  650 mg Rectal Once   ARIPiprazole  5 mg Oral QHS   aspirin  81 mg Oral Daily   clopidogrel  75 mg Oral Daily   tamsulosin  0.4 mg Oral QHS   Continuous Infusions:  azithromycin 500 mg (10/13/21 0149)   cefTRIAXone (ROCEPHIN)  IV 2 g (10/13/21 0057)   Diet Order             Diet regular Room service appropriate? Yes; Fluid consistency: Thin  Diet effective now                 Weight change:   Wt Readings from Last 3 Encounters:  10/13/21 75.3 kg  05/04/16 77.1 kg  05/25/14 78 kg     Consultants:see note  Procedures:see note Antimicrobials: Anti-infectives (From admission, onward)    Start     Dose/Rate Route Frequency Ordered Stop   10/12/21 0130  cefTRIAXone (ROCEPHIN) 2 g in sodium chloride 0.9 % 100 mL IVPB        2 g 200 mL/hr over 30 Minutes Intravenous Every 24 hours 10/12/21 0115 10/17/21 0129   10/12/21 0130  azithromycin (ZITHROMAX) 500 mg in sodium chloride 0.9 % 250  mL IVPB        500 mg 250 mL/hr over 60 Minutes Intravenous Every 24 hours 10/12/21 0115 10/17/21 0129      Culture/Microbiology    Component Value Date/Time   SDES BLOOD RIGHT ANTECUBITAL 10/12/2021 0123   SPECREQUEST  10/12/2021 0123    BOTTLES DRAWN AEROBIC AND ANAEROBIC Blood Culture adequate volume   CULT  10/12/2021 0123    NO GROWTH 1 DAY Performed at [facility] [address], [place], [place] [address]    REPTSTATUS PENDING 10/12/2021 0123    Other culture-see note  Unresulted Labs (From admission, onward)     Start     Ordered   10/13/21 [id number]  Basic metabolic panel  Daily,   R     Question:  Specimen collection method  Answer:  Lab=Lab collect   10/12/21 1339   10/13/21 0500  CBC  Daily,   R     Question:  Specimen collection method  Answer:  Lab=Lab collect   10/12/21 1339   10/12/21 0500  Blood gas, venous  Tomorrow morning,  R        10/12/21 0358   10/11/21 2316  Blood culture (routine x 2)  BLOOD CULTURE X 2,   STAT      10/11/21 2315          Data Reviewed: I have personally reviewed following labs and imaging studies CBC: Recent Labs  Lab 10/12/21 0128 10/12/21 0542 10/12/21 0549 10/13/21 0237  WBC 13.4* 13.4*  --  11.9*  NEUTROABS 12.7* 12.1*  --   --   HGB 11.3* 10.8* 11.2* 10.0*  HCT 35.8* 35.8* 33.0* 32.3*  MCV 98.4 101.1*  --  97.0  PLT 160 154  --  [id number]*   Basic Metabolic Panel: Recent Labs  Lab 10/12/21 0128 10/12/21 0542 10/12/21 0549 10/13/21 0237  NA 141 141 143 139  K 3.7 3.8 3.8 3.9  CL 109 111  --  111  CO2 21* 20*  --  21*  GLUCOSE 179* 174*  --  116*  BUN 35* 35*  --  31*  CREATININE 1.92* 1.75*  --  1.51*  CALCIUM 9.3 9.2  --  9.1  MG  --  2.2  --   --   PHOS  --  3.9  --   --    GFR: CrCl cannot be calculated (Unknown ideal weight.). Liver Function Tests: Recent Labs  Lab 10/12/21 0128 10/12/21 0542  AST 16 15  ALT 13 13  ALKPHOS 38 37*  BILITOT 0.6 0.4  PROT 6.6 6.3*  ALBUMIN 3.7 3.4*   No  results for input(s): LIPASE, AMYLASE in the last 168 hours. No results for input(s): AMMONIA in the last 168 hours. Coagulation Profile: No results for input(s): INR, PROTIME in the last 168 hours. Cardiac Enzymes: No results for input(s): CKTOTAL, CKMB, CKMBINDEX, TROPONINI in the last 168 hours. BNP (last 3 results) No results for input(s): PROBNP in the last 8760 hours. HbA1C: No results for input(s): HGBA1C in the last 72 hours. CBG: No results for input(s): GLUCAP in the last 168 hours. Lipid Profile: No results for input(s): CHOL, HDL, LDLCALC, TRIG, CHOLHDL, LDLDIRECT in the last 72 hours. Thyroid Function Tests: No results for input(s): TSH, T4TOTAL, FREET4, T3FREE, THYROIDAB in the last 72 hours. Anemia Panel: No results for input(s): VITAMINB12, FOLATE, FERRITIN, TIBC, IRON, RETICCTPCT in the last 72 hours. Sepsis Labs: Recent Labs  Lab 10/12/21 0128 10/12/21 0542 10/12/21 1816  PROCALCITON  --  <0.10  --   LATICACIDVEN 2.0* 2.7* 2.0*    Recent Results (from the past 240 hour(s))  Resp Panel by RT-PCR (Flu A&B, Covid) Nasopharyngeal Swab     Status: None   Collection Time: 10/11/21 11:11 PM   Specimen: Nasopharyngeal Swab; Nasopharyngeal(NP) swabs in vial transport medium  Result Value Ref Range Status   SARS Coronavirus 2 by RT PCR NEGATIVE NEGATIVE Final    Comment: (NOTE) SARS-CoV-2 target nucleic acids are NOT DETECTED.  The SARS-CoV-2 RNA is generally detectable in upper respiratory specimens during the acute phase of infection. The lowest concentration of SARS-CoV-2 viral copies this assay can detect is 138 copies/mL. A negative result does not preclude SARS-Cov-2 infection and should not be used as the sole basis for treatment or other patient management decisions. A negative result may occur with  improper specimen collection/handling, submission of specimen other than nasopharyngeal swab, presence of viral mutation(s) within the areas targeted by this  assay, and inadequate number of viral copies(<138 copies/mL). A negative result must be combined with clinical observations, patient history,  and epidemiological information. The expected result is Negative.  Fact Sheet for Patients:  EntrepreneurPulse.com.au  Fact Sheet for Healthcare Providers:  IncredibleEmployment.be  This test is no t yet approved or cleared by the [place] FDA and  has been authorized for detection and/or diagnosis of SARS-CoV-2 by FDA under an Emergency Use Authorization (EUA). This EUA will remain  in effect (meaning this test can be used) for the duration of the COVID-19 declaration under Section 564(b)(1) of the Act, 21 U.S.C.section 360bbb-3(b)(1), unless the authorization is terminated  or revoked sooner.       Influenza A by PCR NEGATIVE NEGATIVE Final   Influenza B by PCR NEGATIVE NEGATIVE Final    Comment: (NOTE) The Xpert Xpress SARS-CoV-2/FLU/RSV plus assay is intended as an aid in the diagnosis of influenza from Nasopharyngeal swab specimens and should not be used as a sole basis for treatment. Nasal washings and aspirates are unacceptable for Xpert Xpress SARS-CoV-2/FLU/RSV testing.  Fact Sheet for Patients: EntrepreneurPulse.com.au  Fact Sheet for Healthcare Providers: IncredibleEmployment.be  This test is not yet approved or cleared by the [place] FDA and has been authorized for detection and/or diagnosis of SARS-CoV-2 by FDA under an Emergency Use Authorization (EUA). This EUA will remain in effect (meaning this test can be used) for the duration of the COVID-19 declaration under Section 564(b)(1) of the Act, 21 U.S.C. section 360bbb-3(b)(1), unless the authorization is terminated or revoked.  Performed at [facility], [place] [address] [place], [place] [address]   Blood culture (routine x 2)     Status: None (Preliminary result)   Collection  Time: 10/12/21  1:23 AM   Specimen: BLOOD  Result Value Ref Range Status   Specimen Description BLOOD RIGHT ANTECUBITAL  Final   Special Requests   Final    BOTTLES DRAWN AEROBIC AND ANAEROBIC Blood Culture adequate volume   Culture  Setup Time   Final    GRAM POSITIVE COCCI ANAEROBIC BOTTLE ONLY CRITICAL RESULT CALLED TO, READ BACK BY AND VERIFIED WITH: PHARMD GREG ABBOTT 10/13/21@5 :41 BY TW    Culture   Final    NO GROWTH 1 DAY Performed at [facility], [place] [address] [place], [place] [address]    Report Status PENDING  Incomplete  Blood Culture ID Panel (Reflexed)     Status: Abnormal   Collection Time: 10/12/21  1:23 AM  Result Value Ref Range Status   Enterococcus faecalis NOT DETECTED NOT DETECTED Final   Enterococcus Faecium NOT DETECTED NOT DETECTED Final   Listeria monocytogenes NOT DETECTED NOT DETECTED Final   Staphylococcus species DETECTED (A) NOT DETECTED Final    Comment: CRITICAL RESULT CALLED TO, READ BACK BY AND VERIFIED WITH: PHARMD GREG ABBOTT 10/13/21@5 :41 BY TW    Staphylococcus aureus (BCID) NOT DETECTED NOT DETECTED Final   Staphylococcus epidermidis NOT DETECTED NOT DETECTED Final   Staphylococcus lugdunensis NOT DETECTED NOT DETECTED Final   Streptococcus species NOT DETECTED NOT DETECTED Final   Streptococcus agalactiae NOT DETECTED NOT DETECTED Final   Streptococcus pneumoniae NOT DETECTED NOT DETECTED Final   Streptococcus pyogenes NOT DETECTED NOT DETECTED Final   A.calcoaceticus-baumannii NOT DETECTED NOT DETECTED Final   Bacteroides fragilis NOT DETECTED NOT DETECTED Final   Enterobacterales NOT DETECTED NOT DETECTED Final   Enterobacter cloacae complex NOT DETECTED NOT DETECTED Final   Escherichia coli NOT DETECTED NOT DETECTED Final   Klebsiella aerogenes NOT DETECTED NOT DETECTED Final   Klebsiella oxytoca NOT DETECTED NOT DETECTED Final  Klebsiella pneumoniae NOT DETECTED NOT DETECTED Final   Proteus species NOT DETECTED NOT  DETECTED Final   Salmonella species NOT DETECTED NOT DETECTED Final   Serratia marcescens NOT DETECTED NOT DETECTED Final   Haemophilus influenzae NOT DETECTED NOT DETECTED Final   Neisseria meningitidis NOT DETECTED NOT DETECTED Final   Pseudomonas aeruginosa NOT DETECTED NOT DETECTED Final   Stenotrophomonas maltophilia NOT DETECTED NOT DETECTED Final   Candida albicans NOT DETECTED NOT DETECTED Final   Candida auris NOT DETECTED NOT DETECTED Final   Candida glabrata NOT DETECTED NOT DETECTED Final   Candida krusei NOT DETECTED NOT DETECTED Final   Candida parapsilosis NOT DETECTED NOT DETECTED Final   Candida tropicalis NOT DETECTED NOT DETECTED Final   Cryptococcus neoformans/gattii NOT DETECTED NOT DETECTED Final    Comment: Performed at [facility], [address], [place], [place] [address]     Radiology Studies: DG Chest 2 View  Result Date: 10/12/2021 CLINICAL DATA:  Shortness of breath. EXAM: CHEST - 2 VIEW COMPARISON:  Chest radiograph dated 02/20/2007. FINDINGS: Evaluation is limited due to patient's positioning. Right perihilar densities may represent edema, atelectasis, or pneumonia. There are bibasilar atelectasis. No large pleural effusion or pneumothorax. Stable cardiac silhouette. Moderate size hiatal hernia. Osteopenia with degenerative changes of the spine. Multilevel chronic compression fractures. No acute osseous pathology. IMPRESSION: Right perihilar densities may represent edema, atelectasis, or pneumonia. Electronically Signed   By: Elgie Collard M.D.   On: 10/12/2021 00:00     LOS: 1 day   Lanae Boast, MD [facility]  10/13/2021, 8:37 AM

## 2021-10-13 NOTE — Consult Note (Signed)
NAME:  Andrew Frey, MRN:  [id number], DOB:  Mar 12, 1922, LOS: 1 ADMISSION DATE:  10/11/2021, CONSULTATION DATE:  10/13/2021 REFERRING MD:  Jonathon Bellows, [facility]IE[facility] COMPLAINT:  Short of the breathing   History of Present Illness:  87 yo male from [place] in [place] with dyspnea.  Noted to have abnormal breath sounds by EMS.  Started on solumedrol and nebulizer treatment for possible COPD exacerbation.  He had fever 101.5 F in ER.  He was started on antibiotics for pneumonia.  COVID/influenza PCR negative.  He has progressive respiratory distress with difficulty controlling his respiratory secretions.  Pulmonary consulted to assess respiratory management and transfer to ICU.  Patient not able to provide history.  History from chart and medical team.  Pertinent  Medical History  CHF, COPD, Dementia, Anemia, BPH, CAD, DJD, HLD, HTN, Spinal stenosis  Significant Hospital Events: Including procedures, antibiotic start and stop dates in addition to other pertinent events   11/30 admit, started antibiotics 12/01 palliative care consulted >> continue full code 12/02 transfer to ICU  Interim History / Subjective:    Objective   Blood pressure 132/65, pulse 87, temperature 98.4 F (36.9 C), temperature source Oral, resp. rate (!) 25, weight 75.3 kg, SpO2 98 %.        Intake/Output Summary (Last 24 hours) at 10/13/2021 1605 Last data filed at 10/13/2021 1604 Gross per 24 hour  Intake 684.91 ml  Output 1825 ml  Net -1140.09 ml   Filed Weights   10/13/21 0500  Weight: 75.3 kg    Examination:  General - somnolent, wakes up easily, gurgling Eyes - pupils reactive ENT - poor dentition Cardiac - regular rate/rhythm, no murmur Chest - b/l crackles Rt > Lt Abdomen - soft, non tender, + bowel sounds Extremities - no cyanosis, clubbing, or edema Skin - no rashes Neuro - moves extremities   Resolved Hospital Problem list     Assessment & Plan:   Acute hypoxic respiratory failure most  likely from aspiration pneumonitis. - f/u CXR, ABG - continue supplemental oxygen to keep SpO2 > 92% - continue ABx - will need to discuss with family about goals of care >> concerned he will need intubation  Hx of COPD. - prn duoneb  Chronic diastolic CHF. Hx of CAD. - volume status seems okay at present - hold ASA, plavix while NPO  Hx of dementia. - hold abilify while NPO  Anemia of critical illness, thrombocytopenia. - f/u CBC  CKD 3a. BPH. - f/u BMET - hold flomax while NPO  GPC in blood culture. - likely Staph epi and contaminate  Goals of care. - palliative care consulted - will need to continue conversation about goals of care with family; tried calling pt's daughter but no answer   Best Practice (right click and "Reselect all SmartList Selections" daily)   Diet/type: NPO DVT prophylaxis: systemic heparin GI prophylaxis: N/A Lines: N/A Foley:  N/A Code Status:  full code Last date of multidisciplinary goals of care discussion [x]   Labs   CBC: Recent Labs  Lab 10/12/21 0128 10/12/21 0542 10/12/21 0549 10/13/21 0237  WBC 13.4* 13.4*  --  11.9*  NEUTROABS 12.7* 12.1*  --   --   HGB 11.3* 10.8* 11.2* 10.0*  HCT 35.8* 35.8* 33.0* 32.3*  MCV 98.4 101.1*  --  97.0  PLT 160 154  --  129*    Basic Metabolic Panel: Recent Labs  Lab 10/12/21 0128 10/12/21 0542 10/12/21 0549 10/13/21 0237  NA 141 141 143 139  K 3.7 3.8 3.8 3.9  CL 109 111  --  111  CO2 21* 20*  --  21*  GLUCOSE 179* 174*  --  116*  BUN 35* 35*  --  31*  CREATININE 1.92* 1.75*  --  1.51*  CALCIUM 9.3 9.2  --  9.1  MG  --  2.2  --   --   PHOS  --  3.9  --   --    GFR: CrCl cannot be calculated (Unknown ideal weight.). Recent Labs  Lab 10/12/21 0128 10/12/21 0542 10/12/21 1816 10/13/21 0237  PROCALCITON  --  <0.10  --   --   WBC 13.4* 13.4*  --  11.9*  LATICACIDVEN 2.0* 2.7* 2.0*  --     Liver Function Tests: Recent Labs  Lab 10/12/21 0128 10/12/21 0542  AST 16  15  ALT 13 13  ALKPHOS 38 37*  BILITOT 0.6 0.4  PROT 6.6 6.3*  ALBUMIN 3.7 3.4*   No results for input(s): LIPASE, AMYLASE in the last 168 hours. No results for input(s): AMMONIA in the last 168 hours.  ABG    Component Value Date/Time   PHART 7.378 10/13/2021 1540   PCO2ART 39.2 10/13/2021 1540   PO2ART 82.2 (L) 10/13/2021 1540   HCO3 22.6 10/13/2021 1540   TCO2 22 10/12/2021 0549   ACIDBASEDEF 1.8 10/13/2021 1540   O2SAT 96.1 10/13/2021 1540     Coagulation Profile: No results for input(s): INR, PROTIME in the last 168 hours.  Cardiac Enzymes: No results for input(s): CKTOTAL, CKMB, CKMBINDEX, TROPONINI in the last 168 hours.  HbA1C: No results found for: HGBA1C  CBG: No results for input(s): GLUCAP in the last 168 hours.  Review of Systems:   Unable to obtain  Past Medical History:  He,  has a past medical history of Anemia, unspecified, BPH (benign prostatic hyperplasia), CAD (coronary artery disease), Calculus of gallbladder without mention of cholecystitis or obstruction, DDD (degenerative disc disease), DJD (degenerative joint disease), Dyspnea, HLD (hyperlipidemia), HTN (hypertension), Low testosterone, Lower back pain, MI (myocardial infarction) ([place]), Osteoarthritis, Other testicular hypofunction, and Spinal stenosis.   Surgical History:   Past Surgical History:  Procedure Laterality Date   APPENDECTOMY  1965   stent placed  2013   TOTAL HIP ARTHROPLASTY     TOTAL KNEE ARTHROPLASTY  2007   left   TOTAL KNEE ARTHROPLASTY  2005   right     Social History:   reports that he has never smoked. He has never used smokeless tobacco. He reports that he does not drink alcohol and does not use drugs.   Family History:  His family history includes Cancer in his father; Heart attack in an other family member; Heart disease in an other family member; Hypertension in his mother and another family member; Other in his mother; Stroke in his mother.   Allergies No  Known Allergies   Home Medications  Prior to Admission medications   Medication Sig Start Date End Date Taking? Authorizing Provider  acetaminophen (TYLENOL) 325 MG tablet Take 325 mg by mouth 3 (three) times daily as needed for mild pain.   Yes [provider]  albuterol (PROVENTIL) (2.5 MG/3ML) 0.083% nebulizer solution Take 2.5 mg by nebulization 3 (three) times daily.   Yes [provider]  albuterol (VENTOLIN HFA) 108 (90 Base) MCG/ACT inhaler Inhale 2 puffs into the lungs every 4 (four) hours as needed for wheezing or shortness of breath.   Yes [provider]  amoxicillin (  AMOXIL) 500 MG capsule Take 2,000 mg by mouth See admin instructions. 1 hour prior to dental appointment   Yes [provider]  anti-nausea (EMETROL) solution Take 15 mLs by mouth as needed for nausea.   Yes [provider]  ARIPiprazole (ABILIFY) 5 MG tablet Take 5 mg by mouth at bedtime.   Yes [provider]  ARTIFICIAL TEAR OP Place 2 drops into both eyes in the morning and at bedtime.   Yes [provider]  aspirin 81 MG chewable tablet Take 81 mg by mouth daily.   Yes [provider]  B Complex Vitamins (VITAMIN-B COMPLEX PO) Take 1 tablet by mouth daily.   Yes [provider]  clopidogrel (PLAVIX) 75 MG tablet Take 75 mg by mouth daily with breakfast.   Yes [provider]  dextromethorphan (DELSYM) 30 MG/5ML liquid Take 10 mLs by mouth 2 (two) times daily as needed for cough.   Yes [provider]  lidocaine (LIDODERM) 5 % Place 1 patch onto the skin daily. Remove & Discard patch within 12 hours or as directed by MD   Yes [provider]  loperamide (IMODIUM A-D) 2 MG tablet Take 2-4 mg by mouth See admin instructions. 4 mg after first loose stool, then 2 mg after each loose stool ** max 4 tabs in 24 hours **   Yes [provider]  meloxicam (MOBIC) 7.5 MG tablet Take 2 tablets by mouth daily.   Yes  [provider]  Menthol, Topical Analgesic, (BIOFREEZE) 4 % GEL Apply 1 application topically 2 (two) times daily as needed (shoulder pain).   Yes [provider]  mometasone (ASMANEX, 30 METERED DOSES,) 220 MCG/ACT inhaler Inhale 2 puffs into the lungs every evening.   Yes [provider]  OVER THE COUNTER MEDICATION Apply 1 application topically See admin instructions. CBD pain stick apply to area of pain  on shoulder 3 times daily as needed for pain   Yes [provider]  OVER THE COUNTER MEDICATION Take 2 tablets by mouth in the morning and at bedtime. Juice Plus Garden Chew Gummies   Yes [provider]  OVER THE COUNTER MEDICATION Take 2 tablets by mouth in the morning and at bedtime. Juice Plus [place] Chews   Yes [provider]  OVER THE COUNTER MEDICATION Take 2 tablets by mouth in the morning and at bedtime. Juice Plus Vineyard chews   Yes [provider]  oxyCODONE-acetaminophen (PERCOCET) 10-325 MG per tablet Take 1 tablet by mouth 4 (four) times daily as needed.    Yes [provider]  Polyethyl Glycol-Propyl Glycol (SYSTANE) 0.4-0.3 % GEL ophthalmic gel Place 1 application into both eyes in the morning and at bedtime.   Yes [provider]  sennosides-docusate sodium (SENOKOT-S) 8.6-50 MG tablet Take 1 tablet by mouth in the morning and at bedtime.   Yes [provider]  sertraline (ZOLOFT) 50 MG tablet Take 50 mg by mouth daily.   Yes [provider]  tamsulosin (FLOMAX) 0.4 MG CAPS capsule Take 0.4 mg by mouth at bedtime.   Yes [provider]     Critical care time: 38 minutes  Chesley Mires, MD [facility] Pager - [phone] 10/13/2021, 4:11 PM

## 2021-10-13 NOTE — Plan of Care (Signed)
Patient admitted for sepsis. Alert and confused. Pt is agitated and restless, difficulty to communicate due to hearing impaired. Vital signs stable, will continue to monitor

## 2021-10-13 NOTE — Progress Notes (Signed)
PHARMACY - PHYSICIAN COMMUNICATION CRITICAL VALUE ALERT - BLOOD CULTURE IDENTIFICATION (BCID)  Andrew Frey is an 87 y.o. male who presented to [facility] on 10/11/2021 with a chief complaint of SOB/PNA  Assessment:   1/2 blood cultures growing Staphylococcus species, likely contaminant  Name of physician (or Provider) Contacted:  Dr. Rachael Darby  Current antibiotics:  Rocephin and Azithromycin  Changes to prescribed antibiotics recommended:  No changes at this time  Results for orders placed or performed during the hospital encounter of 10/11/21  Blood Culture ID Panel (Reflexed) (Collected: 10/12/2021  1:23 AM)  Result Value Ref Range   Enterococcus faecalis NOT DETECTED NOT DETECTED   Enterococcus Faecium NOT DETECTED NOT DETECTED   Listeria monocytogenes NOT DETECTED NOT DETECTED   Staphylococcus species DETECTED (A) NOT DETECTED   Staphylococcus aureus (BCID) NOT DETECTED NOT DETECTED   Staphylococcus epidermidis NOT DETECTED NOT DETECTED   Staphylococcus lugdunensis NOT DETECTED NOT DETECTED   Streptococcus species NOT DETECTED NOT DETECTED   Streptococcus agalactiae NOT DETECTED NOT DETECTED   Streptococcus pneumoniae NOT DETECTED NOT DETECTED   Streptococcus pyogenes NOT DETECTED NOT DETECTED   A.calcoaceticus-baumannii NOT DETECTED NOT DETECTED   Bacteroides fragilis NOT DETECTED NOT DETECTED   Enterobacterales NOT DETECTED NOT DETECTED   Enterobacter cloacae complex NOT DETECTED NOT DETECTED   Escherichia coli NOT DETECTED NOT DETECTED   Klebsiella aerogenes NOT DETECTED NOT DETECTED   Klebsiella oxytoca NOT DETECTED NOT DETECTED   Klebsiella pneumoniae NOT DETECTED NOT DETECTED   Proteus species NOT DETECTED NOT DETECTED   Salmonella species NOT DETECTED NOT DETECTED   Serratia marcescens NOT DETECTED NOT DETECTED   Haemophilus influenzae NOT DETECTED NOT DETECTED   Neisseria meningitidis NOT DETECTED NOT DETECTED   Pseudomonas aeruginosa NOT DETECTED NOT  DETECTED   Stenotrophomonas maltophilia NOT DETECTED NOT DETECTED   Candida albicans NOT DETECTED NOT DETECTED   Candida auris NOT DETECTED NOT DETECTED   Candida glabrata NOT DETECTED NOT DETECTED   Candida krusei NOT DETECTED NOT DETECTED   Candida parapsilosis NOT DETECTED NOT DETECTED   Candida tropicalis NOT DETECTED NOT DETECTED   Cryptococcus neoformans/gattii NOT DETECTED NOT DETECTED    Eddie Candle 10/13/2021  5:43 AM

## 2021-10-13 NOTE — Progress Notes (Signed)
Placed patient on [place] HFNC to give added moisture.  CCM PA came by patient's room and asked that patient be given some moisture.  Patient is currently at 5L with a sat of 98%.  Will continue to monitor patient sat and adjust accordingly.

## 2021-10-13 NOTE — Progress Notes (Signed)
  Interdisciplinary Goals of Care Family Meeting   Date carried out:: 10/13/2021  Location of the meeting: Bedside  Member's involved: Nurse Practitioner, Bedside Registered Nurse, and Family Member or next of kin  Durable Power of Attorney or acting medical decision maker: Daughter Andrew Frey  Discussion: We discussed goals of care for [name] .  Spoke with daughter Andrew Frey and grandson of Andrew Frey at bedside. They stated that Andrew Frey has been through this before with pneumonia and recovered well. They would like to continue current full code measures and if he were to decline are okay with [place] intubating and placing airway.  Code status: Full Code  Disposition: Continue current acute care  Time spent for the meeting: 30 minutes  [name] 10/13/2021, 11:04 PM

## 2021-10-13 NOTE — Progress Notes (Signed)
RT NOTES: ABG obtained and sent to lab. Lab tech notified.  

## 2021-10-13 NOTE — TOC Initial Note (Addendum)
Transition of Care [facility]) - Initial/Assessment Note    Patient Details  Name: Andrew Frey MRN: [id number] Date of Birth: 1922/09/15  Transition of [facility]) CM/SW Contact:    Lockie Pares, RN Phone Number: 10/13/2021, 8:11 AM  Clinical Narrative:                 87 yo from [facility] in [place] presented with [facility] increased oxygen demand. He has oxygen at 2LPM normally at [place]. Sepsis protocol,  currently on 6L nonrebreathier at night due to open mouth breathing.    Called [facility] about ancillary services provided there. Left message. CM will follow for needs, recommendations, and transitions [phone] Spoke to [place], they havve services, will just need to fax any HH orders to [phone].This is a assited living facility,    Barriers to Discharge: Continued Medical Work up   Patient Goals and CMS Choice        Expected Discharge Plan and Services         Living arrangements for the past 2 months: Independent Living Facility                Assisted living                      Prior Living Arrangements/Services Living arrangements for the past 2 months: Independent Living Facility Lives with:: Self Patient language and need for interpreter reviewed:: Yes        Need for Family Participation in Patient Care: Yes (Comment) Care giver support system in place?: Yes (comment)   Criminal Activity/Legal Involvement Pertinent to Current Situation/Hospitalization: No - Comment as needed  Activities of Daily Living      Permission Sought/Granted                  Emotional Assessment       Orientation: : Fluctuating Orientation (Suspected and/or reported Sundowners) Alcohol / Substance Use: Not Applicable Psych Involvement: No (comment)  Admission diagnosis:  Severe sepsis (HCC) [A41.9, R65.20] Community acquired pneumonia of right lower lobe of lung [J18.9] Sepsis with acute hypoxic respiratory failure without septic shock, due to  unspecified organism (HCC) [A41.9, R65.20, J96.01] Patient Active Problem List   Diagnosis Date Noted   Severe sepsis (HCC) 10/12/2021   CAP (community acquired pneumonia) 10/12/2021   SOB (shortness of breath) 10/12/2021   Lactic acidosis 10/12/2021   Fever 10/12/2021   Elevated serum creatinine 10/12/2021   HLD (hyperlipidemia)    HTN (hypertension)    BPH (benign prostatic hyperplasia)    Spinal stenosis    DDD (degenerative disc disease)    CAD (coronary artery disease)    Anemia, unspecified    Calculus of gallbladder without mention of cholecystitis or obstruction    Lower back pain    CARDIOMEGALY 02/18/2008   LUNG NODULE 02/18/2008   DYSPNEA 02/18/2008   PCP:  Philemon Kingdom, MD Pharmacy:  No Pharmacies Listed    Social Determinants of Health (SDOH) Interventions    Readmission Risk Interventions No flowsheet data found.

## 2021-10-14 ENCOUNTER — Inpatient Hospital Stay (HOSPITAL_COMMUNITY): Payer: Medicare Other

## 2021-10-14 DIAGNOSIS — A419 Sepsis, unspecified organism: Secondary | ICD-10-CM | POA: Diagnosis not present

## 2021-10-14 DIAGNOSIS — R6520 Severe sepsis without septic shock: Secondary | ICD-10-CM

## 2021-10-14 LAB — GLUCOSE, CAPILLARY
Glucose-Capillary: 100 mg/dL — ABNORMAL HIGH (ref 70–99)
Glucose-Capillary: 92 mg/dL (ref 70–99)
Glucose-Capillary: 101 mg/dL — ABNORMAL HIGH (ref 70–99)
Glucose-Capillary: 63 mg/dL — ABNORMAL LOW (ref 70–99)
Glucose-Capillary: 141 mg/dL — ABNORMAL HIGH (ref 70–99)
Glucose-Capillary: 105 mg/dL — ABNORMAL HIGH (ref 70–99)

## 2021-10-14 LAB — CBC
HCT: 33.6 % — ABNORMAL LOW (ref 39.0–52.0)
Hemoglobin: 10.5 g/dL — ABNORMAL LOW (ref 13.0–17.0)
MCH: 30.4 pg (ref 26.0–34.0)
MCHC: 31.3 g/dL (ref 30.0–36.0)
MCV: 97.4 fL (ref 80.0–100.0)
Platelets: 146 10*3/uL — ABNORMAL LOW (ref 150–400)
RBC: 3.45 MIL/uL — ABNORMAL LOW (ref 4.22–5.81)
RDW: 13.8 % (ref 11.5–15.5)
WBC: 10.9 10*3/uL — ABNORMAL HIGH (ref 4.0–10.5)
nRBC: 0 % (ref 0.0–0.2)

## 2021-10-14 LAB — BASIC METABOLIC PANEL
Anion gap: 7 (ref 5–15)
BUN: 26 mg/dL — ABNORMAL HIGH (ref 8–23)
CO2: 23 mmol/L (ref 22–32)
Calcium: 9.2 mg/dL (ref 8.9–10.3)
Chloride: 112 mmol/L — ABNORMAL HIGH (ref 98–111)
Creatinine, Ser: 1.55 mg/dL — ABNORMAL HIGH (ref 0.61–1.24)
GFR, Estimated: 40 mL/min — ABNORMAL LOW (ref >60)
Glucose, Bld: 100 mg/dL — ABNORMAL HIGH (ref 70–99)
Potassium: 3.8 mmol/L (ref 3.5–5.1)
Sodium: 142 mmol/L (ref 135–145)

## 2021-10-14 MED ORDER — CHLORHEXIDINE GLUCONATE CLOTH 2 % EX PADS
6.0000 | MEDICATED_PAD | Freq: Every day | CUTANEOUS | Status: DC
  Administered 2021-10-15 – 2021-10-16 (×2): 6 via TOPICAL

## 2021-10-14 MED ORDER — LIDOCAINE HCL URETHRAL/MUCOSAL 2 % EX GEL
1.0000 "application " | Freq: Once | CUTANEOUS | Status: DC
  Filled 2021-10-14: qty 6

## 2021-10-14 MED ORDER — FENTANYL CITRATE (PF) 100 MCG/2ML IJ SOLN: 12.5000 ug | Freq: Once | INTRAMUSCULAR | Status: DC

## 2021-10-14 MED ORDER — ARFORMOTEROL TARTRATE 15 MCG/2ML IN NEBU
15.0000 ug | INHALATION_SOLUTION | Freq: Two times a day (BID) | RESPIRATORY_TRACT | Status: DC
  Administered 2021-10-14 – 2021-10-19 (×11): 15 ug via RESPIRATORY_TRACT
  Filled 2021-10-14 (×12): qty 2

## 2021-10-14 MED ORDER — BUDESONIDE 0.25 MG/2ML IN SUSP
0.2500 mg | Freq: Two times a day (BID) | RESPIRATORY_TRACT | Status: DC
  Administered 2021-10-14 – 2021-10-19 (×11): 0.25 mg via RESPIRATORY_TRACT
  Filled 2021-10-14 (×12): qty 2

## 2021-10-14 MED ORDER — MUPIROCIN 2 % EX OINT
1.0000 "application " | TOPICAL_OINTMENT | Freq: Two times a day (BID) | CUTANEOUS | Status: AC
  Administered 2021-10-14 – 2021-10-18 (×9): 1 via NASAL
  Filled 2021-10-14 (×3): qty 22

## 2021-10-14 NOTE — Progress Notes (Addendum)
eLink Physician-Brief Progress Note Patient Name: Andrew Frey DOB: 05/22/1922 MRN: [id number]   Date of Service  10/14/2021  HPI/Events of Note  Ignore .   eICU Interventions       Intervention Category Intermediate Interventions: Other: Minor Interventions: Agitation / anxiety - evaluation and management  Ranee Gosselin 10/14/2021, 10:45 PM

## 2021-10-14 NOTE — Progress Notes (Signed)
Did CPT on patient with bed during treatments.  Patient tolerating well.

## 2021-10-14 NOTE — Consult Note (Signed)
NAME:  Andrew Frey, MRN:  [id number], DOB:  10/03/1922, LOS: 2 ADMISSION DATE:  10/11/2021, CONSULTATION DATE:  10/13/2021 REFERRING MD:  Jonathon Bellows, [facility] CHIEF COMPLAINT:  Short of the breathing   History of Present Illness:  87 yo male from [facility] in [place] with dyspnea.  Noted to have abnormal breath sounds by EMS.  Started on solumedrol and nebulizer treatment for possible COPD exacerbation.  He had fever 101.5 F in ER.  He was started on antibiotics for pneumonia.  COVID/influenza PCR negative.  He has progressive respiratory distress with difficulty controlling his respiratory secretions.  Pulmonary consulted to assess respiratory management and transfer to ICU.  Patient not able to provide history.  History from chart and medical team.  Pertinent  Medical History  CHF, COPD, Dementia, Anemia, BPH, CAD, DJD, HLD, HTN, Spinal stenosis  Significant Hospital Events: Including procedures, antibiotic start and stop dates in addition to other pertinent events   11/30 admit, started antibiotics 12/01 palliative care consulted >> continue full code 12/02 transfer to ICU 12/3 CCM discussed GOC with daughter > Full code  Interim History / Subjective:  CCM discussed GOC with daughter > Full code  Objective   Blood pressure 125/72, pulse 87, temperature (!) 97.3 F (36.3 C), temperature source Axillary, resp. rate (!) 23, height 5\' 7"  (1.702 m), weight 74.8 kg, SpO2 95 %.    FiO2 (%):  [40 %] 40 %   Intake/Output Summary (Last 24 hours) at 10/14/2021 1001 Last data filed at 10/14/2021 0800 Gross per 24 hour  Intake 965.54 ml  Output 1875 ml  Net -909.46 ml   Filed Weights   10/13/21 0500 10/13/21 1836  Weight: 75.3 kg 74.8 kg   Physical Exam: General: Chronically ill appearing, no acute distress HENT: [place], AT, OP clear, MMM Eyes: EOMI, no scleral icterus Respiratory: Coarse upper airway breath sounds, diminished air entry, intermittently gurgling Cardiovascular: RRR, -M/R/G,  no JVD GI: BS+, soft, nontender Extremities:-Edema,-tenderness Neuro: Awake, follows commands, deaf with hearing aid in place  Resolved Hospital Problem list     Assessment & Plan:   Acute hypoxic respiratory failure most likely from aspiration pneumonitis. Stable on HFNC however high risk for intubation due to respiratory distress ABG with good oxygenation - continue supplemental oxygen to keep SpO2 > 92% - continue Ceftriaxone and azithro - Palliative care consulted. Family confirms full code status but will discuss with family friends (physicians)  Hx of COPD. - Add brovana/pulmicort - prn duoneb  Chronic diastolic CHF. Hx of CAD. - volume status seems okay at present - hold ASA, plavix while NPO  Hx of dementia. - hold abilify while NPO  Anemia of critical illness, thrombocytopenia. - f/u CBC  CKD 3a. BPH. - f/u BMET - hold flomax while NPO  GPC in blood culture. - likely Staph epi and contaminate - F/u repeat cultures  Goals of care. - palliative care consulted - Discussed GOC with daughter. She confirmed full code for now but expresses understanding regarding outcomes of being on mechanical ventilation for patients with advanced age and chronic co-morbidities   Best Practice (right click and "Reselect all SmartList Selections" daily)   Diet/type: NPO DVT prophylaxis: systemic heparin GI prophylaxis: N/A Lines: N/A Foley:  N/A Code Status:  full code Last date of multidisciplinary goals of care discussion [12/3]  Critical care time: 45 minutes   The patient is critically ill with multiple organ systems failure and requires high complexity decision making for assessment and support, frequent evaluation  and titration of therapies, application of advanced monitoring technologies and extensive interpretation of multiple databases.  Independent Critical Care Time: 45 Minutes.   Mechele Collin, M.D. [facility] Pulmonary/Critical Care Medicine 10/14/2021 10:01 AM    Please see Amion for pager number to reach on-call Pulmonary and Critical Care Team.

## 2021-10-14 NOTE — Progress Notes (Signed)
PROGRESS NOTE    Andrew Frey  [id number] DOB: 07-26-1922 DOA: 10/11/2021 PCP: Ernestene Kiel, MD   Chief Complaint  Patient presents with   Shortness of Breath  Brief Narrative/Hospital Course: Andrew Frey, 87 y.o. male with PMH of chronic diastolic CHF, dementia, questionable history of COPD most recent PFT in June 2015 FEV1/FVC of 0.8 on 2 L nasal cannula as needed,  BPH presented with progressive shortness of breath over the last 1 to 2 days, without chest pain nausea vomiting but having cough, no dysuria. In the ED febrile one 101.5 for tachycardia tachypnea saturating 87-95% on 2 L.  Patient was exhibiting significant mount proving placed on nonrebreather, labs with creatinine 1.9 no previous study available but was 1.1 in 2015, BNP 154, leukocytosis 13.4 initial lactic acidosis 2.2 COVID-19 negative blood cultures sent, chest x-ray with right perihilar airspace opacity concerning for pneumonia differential includes edema versus atelectasis.Patient given fluids antibiotics and admitted for severe sepsis and hypoxia   Subjective:  Patient moved to 40M ICU last night has been hypoxic,agitated given Ativan, on high flow nasal cannula, daughter at the bedside.   Appears sleepy.   Assessment & Plan:  Severe sepsis POA Right-sided pneumonia- aspiration pneumonia Acute hypoxic respiratory failure due to above: moved to 2 AM last night has been agitated given Ativan, on high flow nasal cannula BCID panel Staph species-1/2 likely contamination repeat blood culture-continue on ceftriaxone azithromycin, cont antitussives, monitor respiratory status closely.  Minimize sedation.  Now afebrile and WBC count improving.  Pulmonary following Recent Labs  Lab 10/12/21 0128 10/12/21 0542 10/12/21 1816 10/13/21 0237 10/14/21 0608  WBC 13.4* 13.4*  --  11.9* 10.9*  LATICACIDVEN 2.0* 2.7* 2.0*  --   --   PROCALCITON  --  <0.10  --   --   --      Acute hypoxic respiratory failure  due to pneumonia and sepsis. Also mouth breather: Needing high flow nasal cannula continue supplemental oxygen monitor respiratory closely, cont pulmicort/brovana, pulmonary consult input appreciated.  Pyuria abnormal UA with large leukocytes, WBC 21-50, concerning for UTI on antibiotics see above   Elevated creatinine 123456 1.7 Metabolic acidosis and bicarb 20 Creat at 1.5,minimize nephrotoxic medication.  Monitor.  Continue gentle IV hydration, currently n.p.o. due to his mental status Recent Labs  Lab 10/12/21 0128 10/12/21 0542 10/13/21 0237 10/14/21 0608  BUN 35* 35* 31* 26*  CREATININE 1.92* 1.75* 1.51* 1.55*    Chronic diastolic CHF: stable BNP 123456.  Euvolemic, monitor closely He is on aspirin Plavix at home  Anemia of critical illness/thrombocytopenia monitor CBC  BPH: cont Flomax  Advanced age/Dementia at baseline Very hard of hearing GOC: Delirium/worsening of his confusion in the setting of acute respiratory illness hypoxia Patient agitated last night.  Palliative care following closely. remains full code. Needing Ativan due to agitation .continue to minimize sedatives, currently now in ICU. Atarax prn.   DVT prophylaxis: SCDs Start: 10/12/21 [id number] he is on asa/plavix already - avoid chemical prophylaxi high risk for bleeding/fall  Code Status:   Code Status: Full Code Family Communication: plan of care discussed with patient,PMT. I spoke to Andrew Frey at bedside She would like to continue the current treatment.  She endorses he gets pneumonia every couple years and gets confused but fairly stable at baseline in ALF  Status [id number] Remains inpatient appropriate because: For ongoing management of pneumonia and sepsis Disposition: Currently not medically stable for discharge. He lives in [place]. Anticipated Disposition: TBD  Objective: Vitals last  24 hrs: Vitals:   10/14/21 0730 10/14/21 0800 10/14/21 0813 10/14/21 0900  BP: 140/71 105/65  125/72  Pulse: 92 85  87   Resp: 20 (!) 23  (!) 23  Temp: (!) 97.3 F (36.3 C)     TempSrc: Axillary     SpO2: 94% 95% 95% 95%  Weight:      Height:       Weight change: -0.5 kg  Intake/Output Summary (Last 24 hours) at 10/14/2021 1004 Last data filed at 10/14/2021 0800 Gross per 24 hour  Intake 965.54 ml  Output 1875 ml  Net -909.46 ml    Net IO Since Admission: -1,674.55 mL [10/14/21 1004]   Physical Examination: General exam: Somnolent, not following command, hard of hearing has hearing aids today  HEENT:Oral mucosa moist, Ear/Nose WNL grossly, dentition normal. Respiratory system: bilaterally diminished,no use of accessory muscle Cardiovascular system: S1 & S2 +, No JVD,. Gastrointestinal system: Abdomen soft, NT,ND, BS+ Nervous System: Somnolent, Extremities: No edema, distal peripheral pulses palpable.  Skin: No rashes,no icterus. MSK: Normal muscle bulk,tone, power   Medications reviewed:  Scheduled Meds:  Chlorhexidine Gluconate Cloth  6 each Topical Daily   Chlorhexidine Gluconate Cloth  6 each Topical Q0600   ipratropium-albuterol  3 mL Nebulization BID   mouth rinse  15 mL Mouth Rinse BID   mupirocin ointment  1 application Nasal BID   Continuous Infusions:  sodium chloride Stopped (10/14/21 0732)   azithromycin Stopped (10/14/21 0324)   cefTRIAXone (ROCEPHIN)  IV Stopped (10/14/21 0154)   Diet Order             Diet NPO time specified  Diet effective now                 Weight change: -0.5 kg  Wt Readings from Last 3 Encounters:  10/13/21 74.8 kg  05/04/16 77.1 kg  05/25/14 78 kg     Consultants:see note  Procedures:see note Antimicrobials: Anti-infectives (From admission, onward)    Start     Dose/Rate Route Frequency Ordered Stop   10/12/21 0130  cefTRIAXone (ROCEPHIN) 2 g in sodium chloride 0.9 % 100 mL IVPB        2 g 200 mL/hr over 30 Minutes Intravenous Every 24 hours 10/12/21 0115 10/17/21 0129   10/12/21 0130  azithromycin (ZITHROMAX) 500 mg in sodium  chloride 0.9 % 250 mL IVPB        500 mg 250 mL/hr over 60 Minutes Intravenous Every 24 hours 10/12/21 0115 10/17/21 0129      Culture/Microbiology    Component Value Date/Time   SDES BLOOD RIGHT ANTECUBITAL 10/12/2021 0123   SPECREQUEST  10/12/2021 0123    BOTTLES DRAWN AEROBIC AND ANAEROBIC Blood Culture adequate volume   CULT  10/12/2021 0123    GRAM POSITIVE COCCI IDENTIFICATION TO FOLLOW Performed at [facility], [place] [address], [place], [place] [address]    REPTSTATUS PENDING 10/12/2021 0123    Other culture-see note  Unresulted Labs (From admission, onward)    None     Data Reviewed: I have personally reviewed following labs and imaging studies CBC: Recent Labs  Lab 10/12/21 0128 10/12/21 0542 10/12/21 0549 10/13/21 0237 10/14/21 0608  WBC 13.4* 13.4*  --  11.9* 10.9*  NEUTROABS 12.7* 12.1*  --   --   --   HGB 11.3* 10.8* 11.2* 10.0* 10.5*  HCT 35.8* 35.8* 33.0* 32.3* 33.6*  MCV 98.4 101.1*  --  97.0 97.4  PLT 160 154  --  129* 146*    Basic Metabolic Panel: Recent Labs  Lab 10/12/21 0128 10/12/21 0542 10/12/21 0549 10/13/21 0237 10/14/21 0608  NA 141 141 143 139 142  K 3.7 3.8 3.8 3.9 3.8  CL 109 111  --  111 112*  CO2 21* 20*  --  21* 23  GLUCOSE 179* 174*  --  116* 100*  BUN 35* 35*  --  31* 26*  CREATININE 1.92* 1.75*  --  1.51* 1.55*  CALCIUM 9.3 9.2  --  9.1 9.2  MG  --  2.2  --   --   --   PHOS  --  3.9  --   --   --     GFR: Estimated Creatinine Clearance: 25.5 mL/min (A) (by C-G formula based on SCr of 1.55 mg/dL (H)). Liver Function Tests: Recent Labs  Lab 10/12/21 0128 10/12/21 0542  AST 16 15  ALT 13 13  ALKPHOS 38 37*  BILITOT 0.6 0.4  PROT 6.6 6.3*  ALBUMIN 3.7 3.4*    No results for input(s): LIPASE, AMYLASE in the last 168 hours. No results for input(s): AMMONIA in the last 168 hours. Coagulation Profile: No results for input(s): INR, PROTIME in the last 168 hours. Cardiac Enzymes: No results for  input(s): CKTOTAL, CKMB, CKMBINDEX, TROPONINI in the last 168 hours. BNP (last 3 results) No results for input(s): PROBNP in the last 8760 hours. HbA1C: No results for input(s): HGBA1C in the last 72 hours. CBG: Recent Labs  Lab 10/13/21 1812 10/13/21 1941 10/13/21 2343 10/14/21 0337 10/14/21 0740  GLUCAP 100* 106* 103* 100* 92   Lipid Profile: No results for input(s): CHOL, HDL, LDLCALC, TRIG, CHOLHDL, LDLDIRECT in the last 72 hours. Thyroid Function Tests: No results for input(s): TSH, T4TOTAL, FREET4, T3FREE, THYROIDAB in the last 72 hours. Anemia Panel: No results for input(s): VITAMINB12, FOLATE, FERRITIN, TIBC, IRON, RETICCTPCT in the last 72 hours. Sepsis Labs: Recent Labs  Lab 10/12/21 0128 10/12/21 0542 10/12/21 1816  PROCALCITON  --  <0.10  --   LATICACIDVEN 2.0* 2.7* 2.0*     Recent Results (from the past 240 hour(s))  Resp Panel by RT-PCR (Flu A&B, Covid) Nasopharyngeal Swab     Status: None   Collection Time: 10/11/21 11:11 PM   Specimen: Nasopharyngeal Swab; Nasopharyngeal(NP) swabs in vial transport medium  Result Value Ref Range Status   SARS Coronavirus 2 by RT PCR NEGATIVE NEGATIVE Final    Comment: (NOTE) SARS-CoV-2 target nucleic acids are NOT DETECTED.  The SARS-CoV-2 RNA is generally detectable in upper respiratory specimens during the acute phase of infection. The lowest concentration of SARS-CoV-2 viral copies this assay can detect is 138 copies/mL. A negative result does not preclude SARS-Cov-2 infection and should not be used as the sole basis for treatment or other patient management decisions. A negative result may occur with  improper specimen collection/handling, submission of specimen other than nasopharyngeal swab, presence of viral mutation(s) within the areas targeted by this assay, and inadequate number of viral copies(<138 copies/mL). A negative result must be combined with clinical observations, patient history, and  epidemiological information. The expected result is Negative.  Fact Sheet for Patients:  BloggerCourse.com  Fact Sheet for Healthcare Providers:  SeriousBroker.it  This test is no t yet approved or cleared by the [place] FDA and  has been authorized for detection and/or diagnosis of SARS-CoV-2 by FDA under an Emergency Use Authorization (EUA). This EUA will remain  in effect (meaning this test can be used)  for the duration of the COVID-19 declaration under Section 564(b)(1) of the Act, 21 U.S.C.section 360bbb-3(b)(1), unless the authorization is terminated  or revoked sooner.       Influenza A by PCR NEGATIVE NEGATIVE Final   Influenza B by PCR NEGATIVE NEGATIVE Final    Comment: (NOTE) The Xpert Xpress SARS-CoV-2/FLU/RSV plus assay is intended as an aid in the diagnosis of influenza from Nasopharyngeal swab specimens and should not be used as a sole basis for treatment. Nasal washings and aspirates are unacceptable for Xpert Xpress SARS-CoV-2/FLU/RSV testing.  Fact Sheet for Patients: EntrepreneurPulse.com.au  Fact Sheet for Healthcare Providers: IncredibleEmployment.be  This test is not yet approved or cleared by the [place] FDA and has been authorized for detection and/or diagnosis of SARS-CoV-2 by FDA under an Emergency Use Authorization (EUA). This EUA will remain in effect (meaning this test can be used) for the duration of the COVID-19 declaration under Section 564(b)(1) of the Act, 21 U.S.C. section 360bbb-3(b)(1), unless the authorization is terminated or revoked.  Performed at [facility], [place] [address], [place], [place] [address]   Blood culture (routine x 2)     Status: None (Preliminary result)   Collection Time: 10/12/21  1:23 AM   Specimen: BLOOD  Result Value Ref Range Status   Specimen Description BLOOD RIGHT ANTECUBITAL  Final   Special Requests    Final    BOTTLES DRAWN AEROBIC AND ANAEROBIC Blood Culture adequate volume   Culture  Setup Time   Final    GRAM POSITIVE COCCI IN BOTH AEROBIC AND ANAEROBIC BOTTLES CRITICAL RESULT CALLED TO, READ BACK BY AND VERIFIED WITH: PHARMD GREG ABBOTT 10/13/21@5 :41 BY TW    Culture   Final    GRAM POSITIVE COCCI IDENTIFICATION TO FOLLOW Performed at [facility], [place] [address], [place], [place] [address]    Report Status PENDING  Incomplete  Blood Culture ID Panel (Reflexed)     Status: Abnormal   Collection Time: 10/12/21  1:23 AM  Result Value Ref Range Status   Enterococcus faecalis NOT DETECTED NOT DETECTED Final   Enterococcus Faecium NOT DETECTED NOT DETECTED Final   Listeria monocytogenes NOT DETECTED NOT DETECTED Final   Staphylococcus species DETECTED (A) NOT DETECTED Final    Comment: CRITICAL RESULT CALLED TO, READ BACK BY AND VERIFIED WITH: PHARMD GREG ABBOTT 10/13/21@5 :41 BY TW    Staphylococcus aureus (BCID) NOT DETECTED NOT DETECTED Final   Staphylococcus epidermidis NOT DETECTED NOT DETECTED Final   Staphylococcus lugdunensis NOT DETECTED NOT DETECTED Final   Streptococcus species NOT DETECTED NOT DETECTED Final   Streptococcus agalactiae NOT DETECTED NOT DETECTED Final   Streptococcus pneumoniae NOT DETECTED NOT DETECTED Final   Streptococcus pyogenes NOT DETECTED NOT DETECTED Final   A.calcoaceticus-baumannii NOT DETECTED NOT DETECTED Final   Bacteroides fragilis NOT DETECTED NOT DETECTED Final   Enterobacterales NOT DETECTED NOT DETECTED Final   Enterobacter cloacae complex NOT DETECTED NOT DETECTED Final   Escherichia coli NOT DETECTED NOT DETECTED Final   Klebsiella aerogenes NOT DETECTED NOT DETECTED Final   Klebsiella oxytoca NOT DETECTED NOT DETECTED Final   Klebsiella pneumoniae NOT DETECTED NOT DETECTED Final   Proteus species NOT DETECTED NOT DETECTED Final   Salmonella species NOT DETECTED NOT DETECTED Final   Serratia marcescens NOT DETECTED NOT  DETECTED Final   Haemophilus influenzae NOT DETECTED NOT DETECTED Final   Neisseria meningitidis NOT DETECTED NOT DETECTED Final   Pseudomonas aeruginosa NOT DETECTED NOT DETECTED Final  Stenotrophomonas maltophilia NOT DETECTED NOT DETECTED Final   Candida albicans NOT DETECTED NOT DETECTED Final   Candida auris NOT DETECTED NOT DETECTED Final   Candida glabrata NOT DETECTED NOT DETECTED Final   Candida krusei NOT DETECTED NOT DETECTED Final   Candida parapsilosis NOT DETECTED NOT DETECTED Final   Candida tropicalis NOT DETECTED NOT DETECTED Final   Cryptococcus neoformans/gattii NOT DETECTED NOT DETECTED Final    Comment: Performed at [facility], [place] [address], [place], [place] [address]  MRSA Next Gen by PCR, Nasal     Status: Abnormal   Collection Time: 10/13/21  6:12 PM   Specimen: Nasal Mucosa; Nasal Swab  Result Value Ref Range Status   MRSA by PCR Next Gen DETECTED (A) NOT DETECTED Final    Comment: CRITICAL RESULT CALLED TO, READ BACK BY AND VERIFIED WITH: MONICA CROSS RN 10/13/2021 @2100  BY JW (NOTE) The GeneXpert MRSA Assay (FDA approved for NASAL specimens only), is one component of a comprehensive MRSA colonization surveillance program. It is not intended to diagnose MRSA infection nor to guide or monitor treatment for MRSA infections. Test performance is not FDA approved in patients less than 69 years old. Performed at [facility], [place] [address] [place], [place] [address]       Radiology Studies: DG Chest Port 1 View  Result Date: 10/14/2021 CLINICAL DATA:  Respiratory failure, shortness of breath. EXAM: PORTABLE CHEST 1 VIEW COMPARISON:  Chest x-rays dated 10/13/2021 and 10/11/2021. FINDINGS: Stable cardiomegaly. Stable appearance of the patchy bilateral airspace opacities and interstitial prominence. Stable appearance of the probable bibasilar atelectasis and/or small pleural effusions. IMPRESSION: Stable chest x-ray. Persistent patchy bilateral  airspace opacities and interstitial prominence suggesting multifocal pneumonia versus pulmonary edema. Probable bibasilar atelectasis and/or small pleural effusions. Electronically Signed   By: Franki Cabot M.D.   On: 10/14/2021 08:00   DG Chest Port 1 View  Result Date: 10/13/2021 CLINICAL DATA:  Shortness of breath and cough, history coronary artery disease post MI, hypertension, severe sepsis EXAM: PORTABLE CHEST 1 VIEW COMPARISON:  Portable exam 1653 hours compared to 10/11/2021 FINDINGS: Enlargement of cardiac silhouette with vascular congestion. Tortuous thoracic aorta. BILATERAL patchy pulmonary infiltrates favoring multifocal pneumonia. Small LEFT pleural effusion. No pneumothorax. Bones demineralized. IMPRESSION: Enlargement of cardiac silhouette with vascular congestion. Patchy pulmonary infiltrates favoring multifocal pneumonia. Electronically Signed   By: Lavonia Dana M.D.   On: 10/13/2021 17:16     LOS: 2 days   Antonieta Pert, MD [facility]  10/14/2021, 10:04 AM

## 2021-10-15 DIAGNOSIS — R6520 Severe sepsis without septic shock: Secondary | ICD-10-CM | POA: Diagnosis not present

## 2021-10-15 DIAGNOSIS — A419 Sepsis, unspecified organism: Secondary | ICD-10-CM | POA: Diagnosis not present

## 2021-10-15 LAB — CBC
HCT: 34.3 % — ABNORMAL LOW (ref 39.0–52.0)
Hemoglobin: 10.7 g/dL — ABNORMAL LOW (ref 13.0–17.0)
MCH: 30 pg (ref 26.0–34.0)
MCHC: 31.2 g/dL (ref 30.0–36.0)
MCV: 96.1 fL (ref 80.0–100.0)
Platelets: 162 10*3/uL (ref 150–400)
RBC: 3.57 MIL/uL — ABNORMAL LOW (ref 4.22–5.81)
RDW: 13.4 % (ref 11.5–15.5)
WBC: 10.6 10*3/uL — ABNORMAL HIGH (ref 4.0–10.5)
nRBC: 0 % (ref 0.0–0.2)

## 2021-10-15 LAB — BASIC METABOLIC PANEL
Anion gap: 8 (ref 5–15)
BUN: 21 mg/dL (ref 8–23)
CO2: 21 mmol/L — ABNORMAL LOW (ref 22–32)
Calcium: 9.2 mg/dL (ref 8.9–10.3)
Chloride: 110 mmol/L (ref 98–111)
Creatinine, Ser: 1.31 mg/dL — ABNORMAL HIGH (ref 0.61–1.24)
GFR, Estimated: 50 mL/min — ABNORMAL LOW (ref >60)
Glucose, Bld: 110 mg/dL — ABNORMAL HIGH (ref 70–99)
Potassium: 3.8 mmol/L (ref 3.5–5.1)
Sodium: 139 mmol/L (ref 135–145)

## 2021-10-15 LAB — GLUCOSE, CAPILLARY
Glucose-Capillary: 104 mg/dL — ABNORMAL HIGH (ref 70–99)
Glucose-Capillary: 87 mg/dL (ref 70–99)
Glucose-Capillary: 89 mg/dL (ref 70–99)

## 2021-10-15 LAB — CULTURE, BLOOD (ROUTINE X 2): Special Requests: ADEQUATE

## 2021-10-15 LAB — TSH: TSH: 1.278 u[IU]/mL (ref 0.350–4.500)

## 2021-10-15 MED ORDER — HEPARIN SODIUM (PORCINE) 5000 UNIT/ML IJ SOLN
5000.0000 [IU] | Freq: Three times a day (TID) | INTRAMUSCULAR | Status: DC
  Administered 2021-10-15 – 2021-10-19 (×12): 5000 [IU] via SUBCUTANEOUS
  Filled 2021-10-15 (×11): qty 1

## 2021-10-15 MED ORDER — LABETALOL HCL 5 MG/ML IV SOLN
INTRAVENOUS | Status: AC
  Filled 2021-10-15: qty 4

## 2021-10-15 MED ORDER — SERTRALINE HCL 50 MG PO TABS
50.0000 mg | ORAL_TABLET | Freq: Every day | ORAL | Status: DC
  Administered 2021-10-15 – 2021-10-19 (×5): 50 mg via ORAL
  Filled 2021-10-15 (×5): qty 1

## 2021-10-15 MED ORDER — LABETALOL HCL 5 MG/ML IV SOLN
5.0000 mg | Freq: Once | INTRAVENOUS | Status: AC
  Administered 2021-10-15: 08:00:00 5 mg via INTRAVENOUS

## 2021-10-15 MED ORDER — ENOXAPARIN SODIUM 30 MG/0.3ML IJ SOSY
30.0000 mg | PREFILLED_SYRINGE | Freq: Every day | INTRAMUSCULAR | Status: DC
  Filled 2021-10-15: qty 0.3

## 2021-10-15 MED ORDER — ALBUTEROL SULFATE (2.5 MG/3ML) 0.083% IN NEBU: 2.5000 mg | INHALATION_SOLUTION | Freq: Four times a day (QID) | RESPIRATORY_TRACT | Status: DC | PRN

## 2021-10-15 NOTE — Progress Notes (Signed)
PROGRESS NOTE    Andrew Frey  [id number] DOB: 06-19-1922 DOA: 10/11/2021 PCP: Ernestene Kiel, MD   Chief Complaint  Patient presents with   Shortness of Breath  Brief Narrative/Hospital Course: Andrew Frey, 87 y.o. male with PMH of chronic diastolic CHF, dementia, questionable history of COPD most recent PFT in June 2015 FEV1/FVC of 0.8 on 2 L nasal cannula as needed,  BPH presented with progressive shortness of breath over the last 1 to 2 days, without chest pain nausea vomiting but having cough, no dysuria. In the ED febrile one 101.5 for tachycardia tachypnea saturating 87-95% on 2 L.  Patient was exhibiting significant mount proving placed on nonrebreather, labs with creatinine 1.9 no previous study available but was 1.1 in 2015, BNP 154, leukocytosis 13.4 initial lactic acidosis 2.2 COVID-19 negative blood cultures sent, chest x-ray with right perihilar airspace opacity concerning for pneumonia differential includes edema versus atelectasis.Patient given fluids antibiotics and admitted for severe sepsis and hypoxia Patient had worsening respiratory status transferred to ICU 12/2 night seen by pulmonary critical care remain full code managed with high flow nasal cannula.  He was also having confusion and agitation  Subjective: Seen and examined this morning.  Was sleeping did wake up answers with "yes ma'am" no meaningful interaction, on 8 L  HFNC Was febrile 101 altered this morning, rest of the vital stable  A. fib with RVR earlier, received labetalol when I entered hIS was heart rate was  in 140s nursing at the bedside heart rate did get better in low 100 eventually  Assessment & Plan:  Severe sepsis POA Right-sided pneumonia- aspiration pneumonia Acute hypoxic respiratory failure due to above: moved to ICU 12/2 night on HFNC.Blood cs staph capitis1/2  suspect contamination repeat blood culture sent 12/3. Continue on ceftriaxone azithromycin, cont antitussives,  pulmonary toileting. Monitor respiratory status closely.Appreciate pulmonary input.  Fever overnight 101-monitor temperature, chest x-ray 12/3 persistent patchy bilateral opacities and interstitial prominence.  Continue aspiration precaution, cont DYS 3. Recent Labs  Lab 10/12/21 0128 10/12/21 0542 10/12/21 1816 10/13/21 0237 10/14/21 0608 10/15/21 0646  WBC 13.4* 13.4*  --  11.9* 10.9* 10.6*  LATICACIDVEN 2.0* 2.7* 2.0*  --   --   --   PROCALCITON  --  <0.10  --   --   --   --    Acute hypoxic respiratory failure due to pneumonia and sepsis. Also mouth breather: On HFNC continue same wean as tolerated, cont pulmicort/brovana, pulmonary consult input appreciated.  Continue chest physiotherapy  Possible UTI on antibiotics as above   A. fib with RVR: Appears new onset, and out of A. fib, received labetalol this morning.  Blood pressure soft can consider oral beta-blocker low-dose.  A. fib in the setting of acute illness, check TSH, echocardiogram . Suspect high risk for anticoagulation  AKI w/Baseline creatinine 1.28-1 05/17/2016 indicating CKD stage IIIa Metabolic acidosis and bicarb 20 Possible progression of his CKD, AKI and admission slowly improving-now at baseline,minimize nephrotoxic medication.  Monitor.  On dysphagia 3 diet continue with aspiration precaution.   Recent Labs  Lab 10/12/21 0128 10/12/21 0542 10/13/21 0237 10/14/21 0608 10/15/21 0646  BUN 35* 35* 31* 26* 21  CREATININE 1.92* 1.75* 1.51* 1.55* 1.31*    Chronic diastolic CHF: stable BNP 123456.  Euvolemic, monitor closely  Anemia of critical illness/thrombocytopenia stable hemoglobin, monitor CBC Recent Labs  Lab 10/13/21 0237 10/14/21 0608 10/15/21 0646  HGB 10.0* 10.5* 10.7*  HCT 32.3* 33.6* 34.3*  WBC 11.9* 10.9* 10.6*  PLT 129* 146* 162     BPH: cont Flomax  Advanced age/Dementia at baseline Very hard of hearing GOC: Delirium/worsening of his confusion in the setting of acute respiratory illness  hypoxia Patient agitated last night.  Palliative care following closely. remains full code. Needing Ativan due to agitation .continue to minimize sedatives,Atarax prn.   DVT prophylaxis: heparin injection 5,000 Units Start: 10/15/21 1030 SCDs Start: 10/12/21 [id number] patient is mostly on bed, high risk for VTE, started on chemical prophylaxis Code Status:   Code Status: Full Code Family Communication: plan of care discussed with patient,PMT. I spoke to [name] at bedside 12/3.  No family at bedside today. She would like to continue the current treatment.  She endorses he gets pneumonia every couple years and gets confused but fairly stable at baseline in ALF  Status [id number] Remains inpatient appropriate because: For ongoing management of pneumonia and sepsis Disposition: Currently not medically stable for discharge. He lives in [place]. Anticipated Disposition: TBD.  If heart rate is stable potentially transfer out of ICU later today  Objective: Vitals last 24 hrs: Vitals:   10/15/21 0600 10/15/21 0735 10/15/21 0800 10/15/21 0801  BP: 133/71  94/66   Pulse: 92  96   Resp: (!) 26  (!) 26   Temp:  98.5 F (36.9 C) 98.5 F (36.9 C)   TempSrc:  Axillary Axillary   SpO2: 92%  96% 96%  Weight:      Height:       Weight change: 1.9 kg  Intake/Output Summary (Last 24 hours) at 10/15/2021 1015 Last data filed at 10/15/2021 0800 Gross per 24 hour  Intake 80.05 ml  Output 1050 ml  Net -969.95 ml   Net IO Since Admission: -2,694.5 mL [10/15/21 1015]   Physical Examination: General exam: AAOx1, frail looking elderly HEENT:Oral mucosa moist, Ear/Nose WNL grossly, dentition normal. Respiratory system: bilaterally breath sounds with coarse crackles, no use of accessory muscle Cardiovascular system: S1 & S2 +, No JVD,. Gastrointestinal system: Abdomen soft,NT,ND, BS+ Nervous System:Alert, awake, moving extremities and grossly nonfocal Extremities: no edema, distal peripheral pulses palpable.   Skin: No rashes,no icterus. MSK: Normal muscle bulk,tone, power   Medications reviewed:  Scheduled Meds:  arformoterol  15 mcg Nebulization BID   budesonide (PULMICORT) nebulizer solution  0.25 mg Nebulization BID   Chlorhexidine Gluconate Cloth  6 each Topical Daily   Chlorhexidine Gluconate Cloth  6 each Topical Q0600   heparin injection (subcutaneous)  5,000 Units Subcutaneous Q8H   ipratropium-albuterol  3 mL Nebulization BID   mouth rinse  15 mL Mouth Rinse BID   mupirocin ointment  1 application Nasal BID   Continuous Infusions:  azithromycin 500 mg (10/15/21 0057)   cefTRIAXone (ROCEPHIN)  IV Stopped (10/15/21 0056)   Diet Order             DIET DYS 3 Room service appropriate? Yes; Fluid consistency: Thin  Diet effective now                 Weight change: 1.9 kg  Wt Readings from Last 3 Encounters:  10/15/21 76.7 kg  05/04/16 77.1 kg  05/25/14 78 kg     Consultants:see note  Procedures:see note Antimicrobials: Anti-infectives (From admission, onward)    Start     Dose/Rate Route Frequency Ordered Stop   10/12/21 0130  cefTRIAXone (ROCEPHIN) 2 g in sodium chloride 0.9 % 100 mL IVPB        2 g 200 mL/hr over 30 Minutes Intravenous  Every 24 hours 10/12/21 0115 10/17/21 0129   10/12/21 0130  azithromycin (ZITHROMAX) 500 mg in sodium chloride 0.9 % 250 mL IVPB        500 mg 250 mL/hr over 60 Minutes Intravenous Every 24 hours 10/12/21 0115 10/17/21 0129      Culture/Microbiology    Component Value Date/Time   SDES BLOOD LEFT HAND 10/14/2021 1106   SPECREQUEST  10/14/2021 1106    BOTTLES DRAWN AEROBIC AND ANAEROBIC Blood Culture adequate volume   CULT  10/14/2021 1106    NO GROWTH < 24 HOURS Performed at [facility], [address] [place], [place] [address]    REPTSTATUS PENDING 10/14/2021 1106    Other culture-see note  Unresulted Labs (From admission, onward)     Start     Ordered   10/16/21 0500  CBC  Daily,   R     Question:   Specimen collection method  Answer:  Lab=Lab collect   10/15/21 0610   10/16/21 0500  Basic metabolic panel  Daily,   R     Question:  Specimen collection method  Answer:  Lab=Lab collect   10/15/21 0610          Data Reviewed: I have personally reviewed following labs and imaging studies CBC: Recent Labs  Lab 10/12/21 0128 10/12/21 0542 10/12/21 0549 10/13/21 0237 10/14/21 0608 10/15/21 0646  WBC 13.4* 13.4*  --  11.9* 10.9* 10.6*  NEUTROABS 12.7* 12.1*  --   --   --   --   HGB 11.3* 10.8* 11.2* 10.0* 10.5* 10.7*  HCT 35.8* 35.8* 33.0* 32.3* 33.6* 34.3*  MCV 98.4 101.1*  --  97.0 97.4 96.1  PLT 160 154  --  129* 146* 162   Basic Metabolic Panel: Recent Labs  Lab 10/12/21 0128 10/12/21 0542 10/12/21 0549 10/13/21 0237 10/14/21 0608 10/15/21 0646  NA 141 141 143 139 142 139  K 3.7 3.8 3.8 3.9 3.8 3.8  CL 109 111  --  111 112* 110  CO2 21* 20*  --  21* 23 21*  GLUCOSE 179* 174*  --  116* 100* 110*  BUN 35* 35*  --  31* 26* 21  CREATININE 1.92* 1.75*  --  1.51* 1.55* 1.31*  CALCIUM 9.3 9.2  --  9.1 9.2 9.2  MG  --  2.2  --   --   --   --   PHOS  --  3.9  --   --   --   --    GFR: Estimated Creatinine Clearance: 30.1 mL/min (A) (by C-G formula based on SCr of 1.31 mg/dL (H)). Liver Function Tests: Recent Labs  Lab 10/12/21 0128 10/12/21 0542  AST 16 15  ALT 13 13  ALKPHOS 38 37*  BILITOT 0.6 0.4  PROT 6.6 6.3*  ALBUMIN 3.7 3.4*   No results for input(s): LIPASE, AMYLASE in the last 168 hours. No results for input(s): AMMONIA in the last 168 hours. Coagulation Profile: No results for input(s): INR, PROTIME in the last 168 hours. Cardiac Enzymes: No results for input(s): CKTOTAL, CKMB, CKMBINDEX, TROPONINI in the last 168 hours. BNP (last 3 results) No results for input(s): PROBNP in the last 8760 hours. HbA1C: No results for input(s): HGBA1C in the last 72 hours. CBG: Recent Labs  Lab 10/14/21 1147 10/14/21 1229 10/14/21 1934 10/14/21 2323  10/15/21 0318  GLUCAP 63* 101* 141* 105* 104*   Lipid Profile: No results for input(s): CHOL, HDL, LDLCALC, TRIG, CHOLHDL, LDLDIRECT  in the last 72 hours. Thyroid Function Tests: No results for input(s): TSH, T4TOTAL, FREET4, T3FREE, THYROIDAB in the last 72 hours. Anemia Panel: No results for input(s): VITAMINB12, FOLATE, FERRITIN, TIBC, IRON, RETICCTPCT in the last 72 hours. Sepsis Labs: Recent Labs  Lab 10/12/21 0128 10/12/21 0542 10/12/21 1816  PROCALCITON  --  <0.10  --   LATICACIDVEN 2.0* 2.7* 2.0*    Recent Results (from the past 240 hour(s))  Resp Panel by RT-PCR (Flu A&B, Covid) Nasopharyngeal Swab     Status: None   Collection Time: 10/11/21 11:11 PM   Specimen: Nasopharyngeal Swab; Nasopharyngeal(NP) swabs in vial transport medium  Result Value Ref Range Status   SARS Coronavirus 2 by RT PCR NEGATIVE NEGATIVE Final    Comment: (NOTE) SARS-CoV-2 target nucleic acids are NOT DETECTED.  The SARS-CoV-2 RNA is generally detectable in upper respiratory specimens during the acute phase of infection. The lowest concentration of SARS-CoV-2 viral copies this assay can detect is 138 copies/mL. A negative result does not preclude SARS-Cov-2 infection and should not be used as the sole basis for treatment or other patient management decisions. A negative result may occur with  improper specimen collection/handling, submission of specimen other than nasopharyngeal swab, presence of viral mutation(s) within the areas targeted by this assay, and inadequate number of viral copies(<138 copies/mL). A negative result must be combined with clinical observations, patient history, and epidemiological information. The expected result is Negative.  Fact Sheet for Patients:  EntrepreneurPulse.com.au  Fact Sheet for Healthcare Providers:  IncredibleEmployment.be  This test is no t yet approved or cleared by the [place] FDA and  has been  authorized for detection and/or diagnosis of SARS-CoV-2 by FDA under an Emergency Use Authorization (EUA). This EUA will remain  in effect (meaning this test can be used) for the duration of the COVID-19 declaration under Section 564(b)(1) of the Act, 21 U.S.C.section 360bbb-3(b)(1), unless the authorization is terminated  or revoked sooner.       Influenza A by PCR NEGATIVE NEGATIVE Final   Influenza B by PCR NEGATIVE NEGATIVE Final    Comment: (NOTE) The Xpert Xpress SARS-CoV-2/FLU/RSV plus assay is intended as an aid in the diagnosis of influenza from Nasopharyngeal swab specimens and should not be used as a sole basis for treatment. Nasal washings and aspirates are unacceptable for Xpert Xpress SARS-CoV-2/FLU/RSV testing.  Fact Sheet for Patients: EntrepreneurPulse.com.au  Fact Sheet for Healthcare Providers: IncredibleEmployment.be  This test is not yet approved or cleared by the [place] FDA and has been authorized for detection and/or diagnosis of SARS-CoV-2 by FDA under an Emergency Use Authorization (EUA). This EUA will remain in effect (meaning this test can be used) for the duration of the COVID-19 declaration under Section 564(b)(1) of the Act, 21 U.S.C. section 360bbb-3(b)(1), unless the authorization is terminated or revoked.  Performed at [facility], [place] [address] [place], [place] [address]   Blood culture (routine x 2)     Status: Abnormal   Collection Time: 10/12/21  1:23 AM   Specimen: BLOOD  Result Value Ref Range Status   Specimen Description BLOOD RIGHT ANTECUBITAL  Final   Special Requests   Final    BOTTLES DRAWN AEROBIC AND ANAEROBIC Blood Culture adequate volume   Culture  Setup Time   Final    GRAM POSITIVE COCCI IN BOTH AEROBIC AND ANAEROBIC BOTTLES CRITICAL RESULT CALLED TO, READ BACK BY AND VERIFIED WITH: PHARMD GREG ABBOTT 10/13/21@5 :41 BY TW    Culture (A)  Final    STAPHYLOCOCCUS  CAPITIS THE SIGNIFICANCE OF ISOLATING THIS ORGANISM FROM A SINGLE VENIPUNCTURE CANNOT BE PREDICTED WITHOUT FURTHER CLINICAL AND CULTURE CORRELATION. SUSCEPTIBILITIES AVAILABLE ONLY ON REQUEST. Performed at [facility], [place] [address] [place], [place] [address]    Report Status 10/15/2021 FINAL  Final  Blood Culture ID Panel (Reflexed)     Status: Abnormal   Collection Time: 10/12/21  1:23 AM  Result Value Ref Range Status   Enterococcus faecalis NOT DETECTED NOT DETECTED Final   Enterococcus Faecium NOT DETECTED NOT DETECTED Final   Listeria monocytogenes NOT DETECTED NOT DETECTED Final   Staphylococcus species DETECTED (A) NOT DETECTED Final    Comment: CRITICAL RESULT CALLED TO, READ BACK BY AND VERIFIED WITH: PHARMD GREG ABBOTT 10/13/21@5 :41 BY TW    Staphylococcus aureus (BCID) NOT DETECTED NOT DETECTED Final   Staphylococcus epidermidis NOT DETECTED NOT DETECTED Final   Staphylococcus lugdunensis NOT DETECTED NOT DETECTED Final   Streptococcus species NOT DETECTED NOT DETECTED Final   Streptococcus agalactiae NOT DETECTED NOT DETECTED Final   Streptococcus pneumoniae NOT DETECTED NOT DETECTED Final   Streptococcus pyogenes NOT DETECTED NOT DETECTED Final   A.calcoaceticus-baumannii NOT DETECTED NOT DETECTED Final   Bacteroides fragilis NOT DETECTED NOT DETECTED Final   Enterobacterales NOT DETECTED NOT DETECTED Final   Enterobacter cloacae complex NOT DETECTED NOT DETECTED Final   Escherichia coli NOT DETECTED NOT DETECTED Final   Klebsiella aerogenes NOT DETECTED NOT DETECTED Final   Klebsiella oxytoca NOT DETECTED NOT DETECTED Final   Klebsiella pneumoniae NOT DETECTED NOT DETECTED Final   Proteus species NOT DETECTED NOT DETECTED Final   Salmonella species NOT DETECTED NOT DETECTED Final   Serratia marcescens NOT DETECTED NOT DETECTED Final   Haemophilus influenzae NOT DETECTED NOT DETECTED Final   Neisseria meningitidis NOT DETECTED NOT DETECTED Final   Pseudomonas  aeruginosa NOT DETECTED NOT DETECTED Final   Stenotrophomonas maltophilia NOT DETECTED NOT DETECTED Final   Candida albicans NOT DETECTED NOT DETECTED Final   Candida auris NOT DETECTED NOT DETECTED Final   Candida glabrata NOT DETECTED NOT DETECTED Final   Candida krusei NOT DETECTED NOT DETECTED Final   Candida parapsilosis NOT DETECTED NOT DETECTED Final   Candida tropicalis NOT DETECTED NOT DETECTED Final   Cryptococcus neoformans/gattii NOT DETECTED NOT DETECTED Final    Comment: Performed at [facility], [address] [place], [place] [address]  MRSA Next Gen by PCR, Nasal     Status: Abnormal   Collection Time: 10/13/21  6:12 PM   Specimen: Nasal Mucosa; Nasal Swab  Result Value Ref Range Status   MRSA by PCR Next Gen DETECTED (A) NOT DETECTED Final    Comment: CRITICAL RESULT CALLED TO, READ BACK BY AND VERIFIED WITH: MONICA CROSS RN 10/13/2021 @2100  BY JW (NOTE) The GeneXpert MRSA Assay (FDA approved for NASAL specimens only), is one component of a comprehensive MRSA colonization surveillance program. It is not intended to diagnose MRSA infection nor to guide or monitor treatment for MRSA infections. Test performance is not FDA approved in patients less than 66 years old. Performed at [facility], [place] [address] [place], [place] [address]   Culture, blood (Routine X 2) w Reflex to ID Panel     Status: None (Preliminary result)   Collection Time: 10/14/21 10:50 AM   Specimen: BLOOD  Result Value Ref Range Status   Specimen Description BLOOD LEFT ANTECUBITAL  Final   Special Requests   Final  BOTTLES DRAWN AEROBIC AND ANAEROBIC Blood Culture adequate volume   Culture   Final    NO GROWTH < 24 HOURS Performed at [facility], [place] [address], [place], [place] [address]    Report Status PENDING  Incomplete  Culture, blood (Routine X 2) w Reflex to ID Panel     Status: None (Preliminary result)   Collection Time: 10/14/21 11:06 AM   Specimen: BLOOD  LEFT HAND  Result Value Ref Range Status   Specimen Description BLOOD LEFT HAND  Final   Special Requests   Final    BOTTLES DRAWN AEROBIC AND ANAEROBIC Blood Culture adequate volume   Culture   Final    NO GROWTH < 24 HOURS Performed at [facility], [place] [address] [place], [place] [address]    Report Status PENDING  Incomplete      Radiology Studies: DG Chest Port 1 View  Result Date: 10/14/2021 CLINICAL DATA:  Respiratory failure, shortness of breath. EXAM: PORTABLE CHEST 1 VIEW COMPARISON:  Chest x-rays dated 10/13/2021 and 10/11/2021. FINDINGS: Stable cardiomegaly. Stable appearance of the patchy bilateral airspace opacities and interstitial prominence. Stable appearance of the probable bibasilar atelectasis and/or small pleural effusions. IMPRESSION: Stable chest x-ray. Persistent patchy bilateral airspace opacities and interstitial prominence suggesting multifocal pneumonia versus pulmonary edema. Probable bibasilar atelectasis and/or small pleural effusions. Electronically Signed   By: Franki Cabot M.D.   On: 10/14/2021 08:00   DG Chest Port 1 View  Result Date: 10/13/2021 CLINICAL DATA:  Shortness of breath and cough, history coronary artery disease post MI, hypertension, severe sepsis EXAM: PORTABLE CHEST 1 VIEW COMPARISON:  Portable exam 1653 hours compared to 10/11/2021 FINDINGS: Enlargement of cardiac silhouette with vascular congestion. Tortuous thoracic aorta. BILATERAL patchy pulmonary infiltrates favoring multifocal pneumonia. Small LEFT pleural effusion. No pneumothorax. Bones demineralized. IMPRESSION: Enlargement of cardiac silhouette with vascular congestion. Patchy pulmonary infiltrates favoring multifocal pneumonia. Electronically Signed   By: Lavonia Dana M.D.   On: 10/13/2021 17:16     LOS: 3 days   Antonieta Pert, MD [facility]  10/15/2021, 10:15 AM

## 2021-10-15 NOTE — Consult Note (Signed)
Daughter had been here 2 days, 97-y-o dad w/ pneumonia "grumpy," so improving. Daughter, who herself has med issues, felt comfortable enough to go home to her 15-y-o twins in [place]. F/U,  Rev. Donnel Saxon Chaplain

## 2021-10-15 NOTE — Progress Notes (Signed)
RT NOTES: 1400 CPT held at this time d/t patient getting out of bed.

## 2021-10-15 NOTE — Consult Note (Signed)
NAME:  Andrew Frey, MRN:  [id number], DOB:  May 05, 1922, LOS: 3 ADMISSION DATE:  10/11/2021, CONSULTATION DATE:  10/13/2021 REFERRING MD:  Jonathon BellowsKC, [facility] COMPLAINT:  Short of the breathing   History of Present Illness:  87 yo male from [place] in [place] with dyspnea.  Noted to have abnormal breath sounds by EMS.  Started on solumedrol and nebulizer treatment for possible COPD exacerbation.  He had fever 101.5 F in ER.  He was started on antibiotics for pneumonia.  COVID/influenza PCR negative.  He has progressive respiratory distress with difficulty controlling his respiratory secretions.  Pulmonary consulted to assess respiratory management and transfer to ICU.  Patient not able to provide history.  History from chart and medical team.  Pertinent  Medical History  CHF, COPD, Dementia, Anemia, BPH, CAD, DJD, HLD, HTN, Spinal stenosis  Significant Hospital Events: Including procedures, antibiotic start and stop dates in addition to other pertinent events   11/30 admit, started antibiotics 12/01 palliative care consulted >> continue full code 12/02 transfer to ICU 12/3 CCM discussed GOC with daughter > Full code  Interim History / Subjective:  Tolerated CPT yesterday with good sputum production with pulmonary hygiene This AM had AFRVR which resolved with IV labetolol 5 mg  Objective   Blood pressure 133/71, pulse 92, temperature 98.8 F (37.1 C), temperature source Oral, resp. rate (!) 26, height 5\' 7"  (1.702 m), weight 76.7 kg, SpO2 96 %.        Intake/Output Summary (Last 24 hours) at 10/15/2021 0804 Last data filed at 10/15/2021 0400 Gross per 24 hour  Intake --  Output 800 ml  Net -800 ml   Filed Weights   10/13/21 0500 10/13/21 1836 10/15/21 0431  Weight: 75.3 kg 74.8 kg 76.7 kg   Physical Exam: General: Chronically ill-appearing, no acute distress HENT: [place], AT, OP clear, MMM, hearing aids Eyes: EOMI, no scleral icterus Respiratory: Improved rhonchi  bilaterally Cardiovascular: RRR, -M/R/G, no JVD GI: BS+, soft, nontender Extremities:-Edema,-tenderness Neuro: Awake, alert and follows commands, moves extremities x 4  Improving Cr Stable anemia Resolved Hospital Problem list     Assessment & Plan:   Acute hypoxic respiratory failure most likely from aspiration pneumonitis. Stable on HFNC however high risk for intubation due to respiratory distress ABG with good oxygenation - Aggressive Pulmonary hygiene: Nebulizers, mucinex, bed percussion - continue supplemental oxygen to keep SpO2 > 92% - continue Ceftriaxone and azithro - Palliative care consulted. Family confirms full code status but will discuss with family friends (physicians)  AFRVR  - S/p labetalol 5 mg - Telemetry  Hx of COPD. - Add brovana/pulmicort - prn duoneb  Chronic diastolic CHF. Hx of CAD. - volume status seems okay at present - hold ASA, plavix while NPO  Hx of dementia. - hold abilify while NPO  Anemia of critical illness, thrombocytopenia. - f/u CBC  CKD 3a. BPH. - f/u BMET - hold flomax while NPO  GPC in blood culture. - likely Staph epi and contaminate - F/u repeat cultures  Goals of care. - palliative care consulted - Discussed GOC with daughter. She confirmed full code for now but expresses understanding regarding outcomes of being on mechanical ventilation for patients with advanced age and chronic co-morbidities   Best Practice (right click and "Reselect all SmartList Selections" daily)   Diet/type: dysphagia diet (see orders) DVT prophylaxis: systemic heparin GI prophylaxis: N/A Lines: N/A Foley:  N/A Code Status:  full code Last date of multidisciplinary goals of care discussion [12/3]  If stable this PM, will sign off. Continue to monitor for now  Critical care time: 33 minutes   The patient is critically ill with respiratory failure and AFRVR and requires high complexity decision making for assessment and support,  frequent evaluation and titration of therapies, application of advanced monitoring technologies and extensive interpretation of multiple databases.  Independent Critical Care Time: 33 Minutes.   Mechele Collin, M.D. [facility] 10/15/2021 8:04 AM   Please see Amion for pager number to reach on-call Pulmonary and Critical Care Team.

## 2021-10-16 ENCOUNTER — Inpatient Hospital Stay (HOSPITAL_COMMUNITY): Payer: Medicare Other

## 2021-10-16 DIAGNOSIS — I4891 Unspecified atrial fibrillation: Secondary | ICD-10-CM | POA: Diagnosis not present

## 2021-10-16 DIAGNOSIS — A419 Sepsis, unspecified organism: Secondary | ICD-10-CM | POA: Diagnosis not present

## 2021-10-16 DIAGNOSIS — R6520 Severe sepsis without septic shock: Secondary | ICD-10-CM | POA: Diagnosis not present

## 2021-10-16 LAB — BASIC METABOLIC PANEL
Anion gap: 7 (ref 5–15)
BUN: 23 mg/dL (ref 8–23)
CO2: 20 mmol/L — ABNORMAL LOW (ref 22–32)
Calcium: 9.4 mg/dL (ref 8.9–10.3)
Chloride: 110 mmol/L (ref 98–111)
Creatinine, Ser: 1.27 mg/dL — ABNORMAL HIGH (ref 0.61–1.24)
GFR, Estimated: 51 mL/min — ABNORMAL LOW (ref >60)
Glucose, Bld: 95 mg/dL (ref 70–99)
Potassium: 3.7 mmol/L (ref 3.5–5.1)
Sodium: 137 mmol/L (ref 135–145)

## 2021-10-16 LAB — CBC
HCT: 32.9 % — ABNORMAL LOW (ref 39.0–52.0)
Hemoglobin: 10.6 g/dL — ABNORMAL LOW (ref 13.0–17.0)
MCH: 30.5 pg (ref 26.0–34.0)
MCHC: 32.2 g/dL (ref 30.0–36.0)
MCV: 94.5 fL (ref 80.0–100.0)
Platelets: 161 10*3/uL (ref 150–400)
RBC: 3.48 MIL/uL — ABNORMAL LOW (ref 4.22–5.81)
RDW: 13.4 % (ref 11.5–15.5)
WBC: 10 10*3/uL (ref 4.0–10.5)
nRBC: 0 % (ref 0.0–0.2)

## 2021-10-16 LAB — ECHOCARDIOGRAM COMPLETE
AR max vel: 1.94 cm2
AV Area VTI: 2.04 cm2
AV Area mean vel: 1.99 cm2
AV Mean grad: 6 mmHg
AV Peak grad: 11.8 mmHg
Ao pk vel: 1.72 m/s
Height: 67 in
S' Lateral: 3.3 cm
Weight: 2641.99 oz

## 2021-10-16 LAB — GLUCOSE, CAPILLARY: Glucose-Capillary: 88 mg/dL (ref 70–99)

## 2021-10-16 MED ORDER — LORAZEPAM 2 MG/ML IJ SOLN
0.2500 mg | Freq: Three times a day (TID) | INTRAMUSCULAR | Status: DC | PRN
  Administered 2021-10-17 – 2021-10-18 (×3): 0.25 mg via INTRAVENOUS
  Filled 2021-10-16 (×3): qty 1

## 2021-10-16 MED ORDER — TAMSULOSIN HCL 0.4 MG PO CAPS
0.4000 mg | ORAL_CAPSULE | Freq: Every day | ORAL | Status: DC
  Administered 2021-10-16 – 2021-10-18 (×3): 0.4 mg via ORAL
  Filled 2021-10-16 (×3): qty 1

## 2021-10-16 MED ORDER — ASPIRIN 81 MG PO CHEW
81.0000 mg | CHEWABLE_TABLET | Freq: Every day | ORAL | Status: DC
  Administered 2021-10-16 – 2021-10-19 (×4): 81 mg via ORAL
  Filled 2021-10-16 (×4): qty 1

## 2021-10-16 MED ORDER — ARTIFICIAL TEARS OPHTHALMIC OINT
TOPICAL_OINTMENT | Freq: Every day | OPHTHALMIC | Status: DC
  Filled 2021-10-16: qty 3.5

## 2021-10-16 MED ORDER — B COMPLEX-C PO TABS
ORAL_TABLET | Freq: Every day | ORAL | Status: DC
  Administered 2021-10-16 – 2021-10-19 (×4): 1 via ORAL
  Filled 2021-10-16 (×4): qty 1

## 2021-10-16 MED ORDER — CLOPIDOGREL BISULFATE 75 MG PO TABS
75.0000 mg | ORAL_TABLET | Freq: Every day | ORAL | Status: DC
  Administered 2021-10-16 – 2021-10-19 (×4): 75 mg via ORAL
  Filled 2021-10-16 (×4): qty 1

## 2021-10-16 MED ORDER — ARIPIPRAZOLE 5 MG PO TABS
5.0000 mg | ORAL_TABLET | Freq: Every day | ORAL | Status: DC
  Administered 2021-10-16 – 2021-10-17 (×2): 5 mg via ORAL
  Filled 2021-10-16 (×3): qty 1

## 2021-10-16 MED ORDER — SODIUM CHLORIDE 0.9 % IV SOLN
2.0000 g | INTRAVENOUS | Status: AC
  Administered 2021-10-16 – 2021-10-17 (×2): 2 g via INTRAVENOUS
  Filled 2021-10-16 (×2): qty 20

## 2021-10-16 MED ORDER — POLYETHYL GLYCOL-PROPYL GLYCOL 0.4-0.3 % OP GEL
1.0000 "application " | Freq: Every day | OPHTHALMIC | Status: DC
  Filled 2021-10-16: qty 10

## 2021-10-16 NOTE — Progress Notes (Addendum)
PROGRESS NOTE    Andrew Frey  [id number] DOB: 1922-04-24 DOA: 10/11/2021 PCP: Andrew Kiel, MD   Chief Complaint  Patient presents with   Shortness of Breath  Brief Narrative/Hospital Course: Andrew Frey, 87 y.o. male with PMH of chronic diastolic CHF, dementia, questionable history of COPD most recent PFT in June 2015 FEV1/FVC of 0.8 on 2 L nasal cannula as needed,  BPH presented with progressive shortness of breath over the last 1 to 2 days, without chest pain nausea vomiting but having cough, no dysuria. In the ED febrile one 101.5 for tachycardia tachypnea saturating 87-95% on 2 L.  Patient was exhibiting significant mount proving placed on nonrebreather, labs with creatinine 1.9 no previous study available but was 1.1 in 2015, BNP 154, leukocytosis 13.4 initial lactic acidosis 2.2 COVID-19 negative blood cultures sent, chest x-ray with right perihilar airspace opacity concerning for pneumonia differential includes edema versus atelectasis.Patient given fluids antibiotics and admitted for severe sepsis and hypoxia Patient had worsening respiratory status transferred to ICU 12/2 night seen by pulmonary critical care remain full code managed with high flow nasal cannula.  He was also having confusion and agitation  Subjective:  Seen this morning.  Hard of hearing confused overnight jittery, somnolent Remains on nasal cannula oxygen at 4 L saturating up to 96%, having ongoing coughing, not able to clear secretions very well Labs shows leukocytosis resolved creatinine downtrending Assessment & Plan:  Severe sepsis POA Right-sided pneumonia- aspiration pneumonia Acute hypoxic respiratory failure due to above: moved to ICU 12/2 night on HFNC.Blood cs staph capitis1/2  suspect contamination repeat blood culture  1/3- ngtd.  Having issues with clearing of secretion, remains on oxygen 4 L HFNC.  With mental status/sundowning confusion, remains ongoing risk of aspiration.   Continue ceftriaxone , completed azithromycin, cont antitussives, pulmonary toileting, chest PT, Pulmicort.chest x-ray 12/3 persistent patchy bilateral opacities and interstitial prominence.  Continue aspiration precaution, cont DYS 3. Recent Labs  Lab 10/12/21 0128 10/12/21 0542 10/12/21 1816 10/13/21 0237 10/14/21 0608 10/15/21 0646 10/16/21 0123  WBC 13.4* 13.4*  --  11.9* 10.9* 10.6* 10.0  LATICACIDVEN 2.0* 2.7* 2.0*  --   --   --   --   PROCALCITON  --  <0.10  --   --   --   --   --     Acute hypoxic respiratory failure due to pneumonia and sepsis. Also mouth breather: Continue high flow nasal cannula continue pulmonary toileting bronchodilators as above.  Continue chest PT.   Possible UTI on antibiotics as above   A. fib with RVR: Appears new onset, in and out of A. Fib.  Continue to monitor in telemetry. A. fib in the setting of acute illness, normal TSH, pending echocardiogram.Suspect high risk for anticoagulation-we will need to determine an outpatient basis.  AKI w/Baseline creatinine 1.28: 05/17/2016 indicating CKD stage IIIa Metabolic acidosis  Possible progression of his CKD  vs AKI on admission, creatinine improved to 1.2.  Encourage hydration.  Monitor Recent Labs  Lab 10/12/21 0542 10/13/21 0237 10/14/21 0608 10/15/21 0646 10/16/21 0123  BUN 35* 31* 26* 21 23  CREATININE 1.75* 1.51* 1.55* 1.31* 1.27*     Chronic diastolic CHF: stable BNP 123456.  Euvolemic, monitor closely  Anemia of critical illness/thrombocytopenia stable hemoglobin, monitor CBC Recent Labs  Lab 10/14/21 0608 10/15/21 0646 10/16/21 0123  HGB 10.5* 10.7* 10.6*  HCT 33.6* 34.3* 32.9*  WBC 10.9* 10.6* 10.0  PLT 146* 162 161      BPH:  cont Flomax  Goals of care Advanced age/Dementia at baseline Very hard of hearing Acute metabolic encephalopathy:  Worsening confusion in the setting of acute respiratory illness hypoxia, advanced age Continue supportive care, delirium precaution fall  precaution, aspiration precaution.   Palliative care is following closely with family wants to continue with full code. Patient is at risk for decompensation, high risk for readmission.  Daughter updated over the phone this afternoon 1:30 pm We will try to minimized iv ativan, resume his home po meds as much as we can.  DVT prophylaxis: heparin injection 5,000 Units Start: 10/15/21 1030 SCDs Start: 10/12/21 0336  Code Status:   Code Status: Full Code Family Communication: plan of care discussed with patient,PMT. I spoke to Andrew Frey at bedside 12/3,no family this morning at bedside. Status [id number] Remains inpatient appropriate because:For ongoing management of pneumonia and sepsis. Disposition: Currently not medically stable for discharge.He lives in [place]. Anticipated Disposition: TBD. Awaiting for progressive bed.  Objective: Vitals last 24 hrs: Vitals:   10/16/21 0600 10/16/21 0700 10/16/21 0729 10/16/21 0735  BP: (!) 164/77 135/68    Pulse: (!) 110 92 (!) 116   Resp: (!) 26 (!) 30 (!) 24   Temp:    98.4 F (36.9 C)  TempSrc:    Axillary  SpO2: 90% (!) 89% 96%   Weight:      Height:       Weight change: -1.8 kg  Intake/Output Summary (Last 24 hours) at 10/16/2021 [id number] Last data filed at 10/16/2021 0400 Gross per 24 hour  Intake --  Output 1190 ml  Net -1190 ml    Net IO Since Admission: -3,884.5 mL [10/16/21 0903]   Physical Examination: General exam: Somnolent hard of hearing oriented x0 , elderly frail and currently coughing.  HEENT:Oral mucosa moist, Ear/Nose WNL grossly, dentition normal. Respiratory system: bilaterally crackles and wheezing present, no use of accessory muscle Cardiovascular system: S1 & S2 +, No JVD. Gastrointestinal system: Abdomen soft,NT,ND, BS+. Nervous System:Alert, awake, moving extremities and grossly nonfocal. Extremities: no edema, distal peripheral pulses palpable.  Skin:No rashes,no icterus. DY:[id number] muscle bulk,tone, power.    Medications reviewed:  Scheduled Meds:  arformoterol  15 mcg Nebulization BID   budesonide (PULMICORT) nebulizer solution  0.25 mg Nebulization BID   Chlorhexidine Gluconate Cloth  6 each Topical Daily   Chlorhexidine Gluconate Cloth  6 each Topical Q0600   heparin injection (subcutaneous)  5,000 Units Subcutaneous Q8H   ipratropium-albuterol  3 mL Nebulization BID   mouth rinse  15 mL Mouth Rinse BID   mupirocin ointment  1 application Nasal BID   sertraline  50 mg Oral Daily   Continuous Infusions:   Diet Order             DIET DYS 3 Room service appropriate? Yes; Fluid consistency: Thin  Diet effective now                 Weight change: -1.8 kg  Wt Readings from Last 3 Encounters:  10/16/21 74.9 kg  05/04/16 77.1 kg  05/25/14 78 kg     Consultants:see note  Procedures:see note Antimicrobials: Anti-infectives (From admission, onward)    Start     Dose/Rate Route Frequency Ordered Stop   10/12/21 0130  cefTRIAXone (ROCEPHIN) 2 g in sodium chloride 0.9 % 100 mL IVPB        2 g 200 mL/hr over 30 Minutes Intravenous Every 24 hours 10/12/21 0115 10/16/21 0245   10/12/21 0130  azithromycin (ZITHROMAX) 500 mg  in sodium chloride 0.9 % 250 mL IVPB        500 mg 250 mL/hr over 60 Minutes Intravenous Every 24 hours 10/12/21 0115 10/16/21 0330      Culture/Microbiology    Component Value Date/Time   SDES BLOOD LEFT HAND 10/14/2021 1106   SPECREQUEST  10/14/2021 1106    BOTTLES DRAWN AEROBIC AND ANAEROBIC Blood Culture adequate volume   CULT  10/14/2021 1106    NO GROWTH 2 DAYS Performed at [facility], [address] [place], [place] [address]    REPTSTATUS PENDING 10/14/2021 1106    Other culture-see note  Unresulted Labs (From admission, onward)     Start     Ordered   10/16/21 0500  CBC  Daily,   R     Question:  Specimen collection method  Answer:  Lab=Lab collect   10/15/21 0610   10/16/21 0500  Basic metabolic panel  Daily,   R     Question:   Specimen collection method  Answer:  Lab=Lab collect   10/15/21 0610          Data Reviewed: I have personally reviewed following labs and imaging studies CBC: Recent Labs  Lab 10/12/21 0128 10/12/21 0542 10/12/21 0549 10/13/21 0237 10/14/21 0608 10/15/21 0646 10/16/21 0123  WBC 13.4* 13.4*  --  11.9* 10.9* 10.6* 10.0  NEUTROABS 12.7* 12.1*  --   --   --   --   --   HGB 11.3* 10.8* 11.2* 10.0* 10.5* 10.7* 10.6*  HCT 35.8* 35.8* 33.0* 32.3* 33.6* 34.3* 32.9*  MCV 98.4 101.1*  --  97.0 97.4 96.1 94.5  PLT 160 154  --  129* 146* 162 161    Basic Metabolic Panel: Recent Labs  Lab 10/12/21 0542 10/12/21 0549 10/13/21 0237 10/14/21 0608 10/15/21 0646 10/16/21 0123  NA 141 143 139 142 139 137  K 3.8 3.8 3.9 3.8 3.8 3.7  CL 111  --  111 112* 110 110  CO2 20*  --  21* 23 21* 20*  GLUCOSE 174*  --  116* 100* 110* 95  BUN 35*  --  31* 26* 21 23  CREATININE 1.75*  --  1.51* 1.55* 1.31* 1.27*  CALCIUM 9.2  --  9.1 9.2 9.2 9.4  MG 2.2  --   --   --   --   --   PHOS 3.9  --   --   --   --   --     GFR: Estimated Creatinine Clearance: 31.1 mL/min (A) (by C-G formula based on SCr of 1.27 mg/dL (H)). Liver Function Tests: Recent Labs  Lab 10/12/21 0128 10/12/21 0542  AST 16 15  ALT 13 13  ALKPHOS 38 37*  BILITOT 0.6 0.4  PROT 6.6 6.3*  ALBUMIN 3.7 3.4*    No results for input(s): LIPASE, AMYLASE in the last 168 hours. No results for input(s): AMMONIA in the last 168 hours. Coagulation Profile: No results for input(s): INR, PROTIME in the last 168 hours. Cardiac Enzymes: No results for input(s): CKTOTAL, CKMB, CKMBINDEX, TROPONINI in the last 168 hours. BNP (last 3 results) No results for input(s): PROBNP in the last 8760 hours. HbA1C: No results for input(s): HGBA1C in the last 72 hours. CBG: Recent Labs  Lab 10/14/21 2323 10/15/21 0318 10/15/21 1942 10/15/21 2347 10/16/21 0330  GLUCAP 105* 104* 89 87 88    Lipid Profile: No results for input(s):  CHOL, HDL, LDLCALC, TRIG, CHOLHDL, LDLDIRECT in the last  72 hours. Thyroid Function Tests: Recent Labs    10/14/21 0608  TSH 1.278   Anemia Panel: No results for input(s): VITAMINB12, FOLATE, FERRITIN, TIBC, IRON, RETICCTPCT in the last 72 hours. Sepsis Labs: Recent Labs  Lab 10/12/21 0128 10/12/21 0542 10/12/21 1816  PROCALCITON  --  <0.10  --   LATICACIDVEN 2.0* 2.7* 2.0*     Recent Results (from the past 240 hour(s))  Resp Panel by RT-PCR (Flu A&B, Covid) Nasopharyngeal Swab     Status: None   Collection Time: 10/11/21 11:11 PM   Specimen: Nasopharyngeal Swab; Nasopharyngeal(NP) swabs in vial transport medium  Result Value Ref Range Status   SARS Coronavirus 2 by RT PCR NEGATIVE NEGATIVE Final    Comment: (NOTE) SARS-CoV-2 target nucleic acids are NOT DETECTED.  The SARS-CoV-2 RNA is generally detectable in upper respiratory specimens during the acute phase of infection. The lowest concentration of SARS-CoV-2 viral copies this assay can detect is 138 copies/mL. A negative result does not preclude SARS-Cov-2 infection and should not be used as the sole basis for treatment or other patient management decisions. A negative result may occur with  improper specimen collection/handling, submission of specimen other than nasopharyngeal swab, presence of viral mutation(s) within the areas targeted by this assay, and inadequate number of viral copies(<138 copies/mL). A negative result must be combined with clinical observations, patient history, and epidemiological information. The expected result is Negative.  Fact Sheet for Patients:  EntrepreneurPulse.com.au  Fact Sheet for Healthcare Providers:  IncredibleEmployment.be  This test is no t yet approved or cleared by the [place] FDA and  has been authorized for detection and/or diagnosis of SARS-CoV-2 by FDA under an Emergency Use Authorization (EUA). This EUA will remain  in  effect (meaning this test can be used) for the duration of the COVID-19 declaration under Section 564(b)(1) of the Act, 21 U.S.C.section 360bbb-3(b)(1), unless the authorization is terminated  or revoked sooner.       Influenza A by PCR NEGATIVE NEGATIVE Final   Influenza B by PCR NEGATIVE NEGATIVE Final    Comment: (NOTE) The Xpert Xpress SARS-CoV-2/FLU/RSV plus assay is intended as an aid in the diagnosis of influenza from Nasopharyngeal swab specimens and should not be used as a sole basis for treatment. Nasal washings and aspirates are unacceptable for Xpert Xpress SARS-CoV-2/FLU/RSV testing.  Fact Sheet for Patients: EntrepreneurPulse.com.au  Fact Sheet for Healthcare Providers: IncredibleEmployment.be  This test is not yet approved or cleared by the [place] FDA and has been authorized for detection and/or diagnosis of SARS-CoV-2 by FDA under an Emergency Use Authorization (EUA). This EUA will remain in effect (meaning this test can be used) for the duration of the COVID-19 declaration under Section 564(b)(1) of the Act, 21 U.S.C. section 360bbb-3(b)(1), unless the authorization is terminated or revoked.  Performed at [facility], [place] [address] [place], [place] [address]   Blood culture (routine x 2)     Status: Abnormal   Collection Time: 10/12/21  1:23 AM   Specimen: BLOOD  Result Value Ref Range Status   Specimen Description BLOOD RIGHT ANTECUBITAL  Final   Special Requests   Final    BOTTLES DRAWN AEROBIC AND ANAEROBIC Blood Culture adequate volume   Culture  Setup Time   Final    GRAM POSITIVE COCCI IN BOTH AEROBIC AND ANAEROBIC BOTTLES CRITICAL RESULT CALLED TO, READ BACK BY AND VERIFIED WITH: PHARMD GREG ABBOTT 10/13/21@5 :41 BY TW    Culture (A)  Final  STAPHYLOCOCCUS CAPITIS THE SIGNIFICANCE OF ISOLATING THIS ORGANISM FROM A SINGLE VENIPUNCTURE CANNOT BE PREDICTED WITHOUT FURTHER CLINICAL AND CULTURE  CORRELATION. SUSCEPTIBILITIES AVAILABLE ONLY ON REQUEST. Performed at [facility], [place] [address], [place], [place] [address]    Report Status 10/15/2021 FINAL  Final  Blood Culture ID Panel (Reflexed)     Status: Abnormal   Collection Time: 10/12/21  1:23 AM  Result Value Ref Range Status   Enterococcus faecalis NOT DETECTED NOT DETECTED Final   Enterococcus Faecium NOT DETECTED NOT DETECTED Final   Listeria monocytogenes NOT DETECTED NOT DETECTED Final   Staphylococcus species DETECTED (A) NOT DETECTED Final    Comment: CRITICAL RESULT CALLED TO, READ BACK BY AND VERIFIED WITH: PHARMD GREG ABBOTT 10/13/21@5 :41 BY TW    Staphylococcus aureus (BCID) NOT DETECTED NOT DETECTED Final   Staphylococcus epidermidis NOT DETECTED NOT DETECTED Final   Staphylococcus lugdunensis NOT DETECTED NOT DETECTED Final   Streptococcus species NOT DETECTED NOT DETECTED Final   Streptococcus agalactiae NOT DETECTED NOT DETECTED Final   Streptococcus pneumoniae NOT DETECTED NOT DETECTED Final   Streptococcus pyogenes NOT DETECTED NOT DETECTED Final   A.calcoaceticus-baumannii NOT DETECTED NOT DETECTED Final   Bacteroides fragilis NOT DETECTED NOT DETECTED Final   Enterobacterales NOT DETECTED NOT DETECTED Final   Enterobacter cloacae complex NOT DETECTED NOT DETECTED Final   Escherichia coli NOT DETECTED NOT DETECTED Final   Klebsiella aerogenes NOT DETECTED NOT DETECTED Final   Klebsiella oxytoca NOT DETECTED NOT DETECTED Final   Klebsiella pneumoniae NOT DETECTED NOT DETECTED Final   Proteus species NOT DETECTED NOT DETECTED Final   Salmonella species NOT DETECTED NOT DETECTED Final   Serratia marcescens NOT DETECTED NOT DETECTED Final   Haemophilus influenzae NOT DETECTED NOT DETECTED Final   Neisseria meningitidis NOT DETECTED NOT DETECTED Final   Pseudomonas aeruginosa NOT DETECTED NOT DETECTED Final   Stenotrophomonas maltophilia NOT DETECTED NOT DETECTED Final   Candida albicans NOT  DETECTED NOT DETECTED Final   Candida auris NOT DETECTED NOT DETECTED Final   Candida glabrata NOT DETECTED NOT DETECTED Final   Candida krusei NOT DETECTED NOT DETECTED Final   Candida parapsilosis NOT DETECTED NOT DETECTED Final   Candida tropicalis NOT DETECTED NOT DETECTED Final   Cryptococcus neoformans/gattii NOT DETECTED NOT DETECTED Final    Comment: Performed at [facility], [place] [address], [place], [place] [address]  MRSA Next Gen by PCR, Nasal     Status: Abnormal   Collection Time: 10/13/21  6:12 PM   Specimen: Nasal Mucosa; Nasal Swab  Result Value Ref Range Status   MRSA by PCR Next Gen DETECTED (A) NOT DETECTED Final    Comment: CRITICAL RESULT CALLED TO, READ BACK BY AND VERIFIED WITH: MONICA CROSS RN 10/13/2021 @2100  BY JW (NOTE) The GeneXpert MRSA Assay (FDA approved for NASAL specimens only), is one component of a comprehensive MRSA colonization surveillance program. It is not intended to diagnose MRSA infection nor to guide or monitor treatment for MRSA infections. Test performance is not FDA approved in patients less than 40 years old. Performed at [facility], [place] [address] [place],  [address]   Culture, blood (Routine X 2) w Reflex to ID Panel     Status: None (Preliminary result)   Collection Time: 10/14/21 10:50 AM   Specimen: BLOOD  Result Value Ref Range Status   Specimen Description BLOOD LEFT ANTECUBITAL  Final   Special Requests   Final    BOTTLES DRAWN  AEROBIC AND ANAEROBIC Blood Culture adequate volume   Culture   Final    NO GROWTH 2 DAYS Performed at [facility], [place] [address] [place], [place] [address]    Report Status PENDING  Incomplete  Culture, blood (Routine X 2) w Reflex to ID Panel     Status: None (Preliminary result)   Collection Time: 10/14/21 11:06 AM   Specimen: BLOOD LEFT HAND  Result Value Ref Range Status   Specimen Description BLOOD LEFT HAND  Final   Special Requests   Final    BOTTLES DRAWN  AEROBIC AND ANAEROBIC Blood Culture adequate volume   Culture   Final    NO GROWTH 2 DAYS Performed at [facility], [place] [address] [place],  [address]    Report Status PENDING  Incomplete      Radiology Studies: No results found.   LOS: 4 days   Antonieta Pert, MD [facility]  10/16/2021, 9:03 AM

## 2021-10-16 NOTE — Progress Notes (Signed)
  Echocardiogram 2D Echocardiogram has been performed.  Roosvelt Maser F 10/16/2021, 3:15 PM

## 2021-10-17 DIAGNOSIS — I4891 Unspecified atrial fibrillation: Secondary | ICD-10-CM

## 2021-10-17 DIAGNOSIS — A419 Sepsis, unspecified organism: Secondary | ICD-10-CM | POA: Diagnosis not present

## 2021-10-17 DIAGNOSIS — R6520 Severe sepsis without septic shock: Secondary | ICD-10-CM | POA: Diagnosis not present

## 2021-10-17 LAB — CBC
HCT: 36 % — ABNORMAL LOW (ref 39.0–52.0)
Hemoglobin: 11.4 g/dL — ABNORMAL LOW (ref 13.0–17.0)
MCH: 30 pg (ref 26.0–34.0)
MCHC: 31.7 g/dL (ref 30.0–36.0)
MCV: 94.7 fL (ref 80.0–100.0)
Platelets: 205 K/uL (ref 150–400)
RBC: 3.8 MIL/uL — ABNORMAL LOW (ref 4.22–5.81)
RDW: 13.5 % (ref 11.5–15.5)
WBC: 10.1 K/uL (ref 4.0–10.5)
nRBC: 0 % (ref 0.0–0.2)

## 2021-10-17 LAB — BASIC METABOLIC PANEL
Anion gap: 11 (ref 5–15)
BUN: 21 mg/dL (ref 8–23)
CO2: 20 mmol/L — ABNORMAL LOW (ref 22–32)
Calcium: 9.7 mg/dL (ref 8.9–10.3)
Chloride: 107 mmol/L (ref 98–111)
Creatinine, Ser: 1.25 mg/dL — ABNORMAL HIGH (ref 0.61–1.24)
GFR, Estimated: 52 mL/min — ABNORMAL LOW (ref >60)
Glucose, Bld: 103 mg/dL — ABNORMAL HIGH (ref 70–99)
Potassium: 3.6 mmol/L (ref 3.5–5.1)
Sodium: 138 mmol/L (ref 135–145)

## 2021-10-17 MED ORDER — CHLORHEXIDINE GLUCONATE CLOTH 2 % EX PADS
6.0000 | MEDICATED_PAD | Freq: Every day | CUTANEOUS | Status: DC
  Administered 2021-10-17 – 2021-10-19 (×3): 6 via TOPICAL

## 2021-10-17 MED ORDER — DILTIAZEM HCL ER COATED BEADS 120 MG PO CP24
120.0000 mg | ORAL_CAPSULE | Freq: Every day | ORAL | Status: DC
  Administered 2021-10-17 – 2021-10-19 (×3): 120 mg via ORAL
  Filled 2021-10-17 (×3): qty 1

## 2021-10-17 NOTE — Progress Notes (Signed)
PROGRESS NOTE    Andrew Frey  [id number] DOB: 1921/12/06 DOA: 10/11/2021 PCP: Andrew Kiel, MD   Chief Complaint  Patient presents with   Shortness of Breath  Brief Narrative/Hospital Course: Andrew Frey, 87 y.o. male with PMH of chronic diastolic CHF, dementia, questionable history of COPD most recent PFT in June 2015 FEV1/FVC of 0.8 on 2 L nasal cannula as needed,  BPH presented with progressive shortness of breath over the last 1 to 2 days, without chest pain nausea vomiting but having cough, no dysuria. In the ED febrile one 101.5 for tachycardia tachypnea saturating 87-95% on 2 L.  Patient was exhibiting significant mount proving placed on nonrebreather, labs with creatinine 1.9 no previous study available but was 1.1 in 2015, BNP 154, leukocytosis 13.4 initial lactic acidosis 2.2 COVID-19 negative blood cultures sent, chest x-ray with right perihilar airspace opacity concerning for pneumonia differential includes edema versus atelectasis.Patient given fluids antibiotics and admitted for severe sepsis and hypoxia Patient had worsening respiratory status transferred to ICU 12/2 night seen by pulmonary critical care remain full code managed with high flow nasal cannula.  He was also having confusion and agitation  Subjective: Seen this morning he is more alert awake but still very hard of hearing difficult to hold conversation.  Able to tell me his name. Got 0.25 mg ativan last night Saying" Take me home from this court house" On 2 L nasal cannula, ate some this morning, breath sounds less rhonchorous  Assessment & Plan:  Severe sepsis POA Right-sided pneumonia- aspiration pneumonia Acute hypoxic respiratory failure due to above: moved to ICU 12/2 night on HFNC.Blood cs staph capitis1/2  suspect contamination repeat blood culture  1/3- ngtd.  Having issues with clearing of secretion-being managed with aggressive pulmonary toileting with chest physiotherapy, nebulizer  Brovana, Pulmicort-supplemental oxygen now weaned down to 2 L.  Appears more alert awake.Minimize sedation.Complete azithromycin and on ceftriaxone.  He is afebrile leukocytosis has resolved. CXR12/3 persistent patchy bilateral opacities and interstitial prominence.  Seen by pulmonary and has signed off.  Awaiting for progressive bed Recent Labs  Lab 10/12/21 0128 10/12/21 0542 10/12/21 1816 10/13/21 0237 10/14/21 0608 10/15/21 0646 10/16/21 0123 10/17/21 0144  WBC 13.4* 13.4*  --  11.9* 10.9* 10.6* 10.0 10.1  LATICACIDVEN 2.0* 2.7* 2.0*  --   --   --   --   --   PROCALCITON  --  <0.10  --   --   --   --   --   --     Acute hypoxic respiratory failure due to pneumonia and sepsis. Also mouth breather: Oxygen requirement improving on 2 L [place] this morning.  Continue pulmonary toileting chest PT therapy as above  Possible UTI on antibiotics as above   A. fib with RVR: Appears new onset, in and out of A. Fib.  Continue to monitor in telemetry. A. fib in the setting of acute illness, normal TSH, stable echocardiogram.S uspect high risk for anticoagulation- will defer to cardio, consutl cardiology.  AKI w/Baseline creatinine 1.28: 05/17/2016 indicating CKD stage IIIa Metabolic acidosis  Possible progression of his CKD  vs AKI on admission, creatinine improved to 1.2.  Encourage oral intake.  Recent Labs  Lab 10/13/21 0237 10/14/21 0608 10/15/21 0646 10/16/21 0123 10/17/21 0144  BUN 31* 26* 21 23 21   CREATININE 1.51* 1.55* 1.31* 1.27* 1.25*     Chronic diastolic CHF: stable BNP 123456.  Appears overall euvolemic. Net IO Since Admission: -3,556.3 mL [10/17/21 0945]  Anemia of critical illness/thrombocytopenia -counts stable  Recent Labs  Lab 10/15/21 0646 10/16/21 0123 10/17/21 0144  HGB 10.7* 10.6* 11.4*  HCT 34.3* 32.9* 36.0*  WBC 10.6* 10.0 10.1  PLT 162 161 205      BPH: cont Flomax  Goals of care Advanced age/Dementia at baseline Very hard of hearing Acute metabolic  encephalopathy:  Worsening confusion in the setting of acute respiratory illness hypoxia, advanced age, hospitalization, unfamiliar environment, deconditioning.  Continue with supportive care, delirium precaution fall precaution, aspiration precautions.  Seen by palliative care remains full code aggressive treatment at this time per family request. Given his overall frailty at risk of decompensation respiratory admission.  Continue very low-dose Ativan for agitation only , continue home Abilify, Zoloft.  Encourage PT OT  DVT prophylaxis: heparin injection 5,000 Units Start: 10/15/21 1030 SCDs Start: 10/12/21 0336  Code Status:   Code Status: Full Code Family Communication: plan of care discussed with patient,PMT. I spoke to Andrew Frey at bedside previously on the phone 12/5 and again updated on the phone today. Status [id number] Remains inpatient appropriate because:For ongoing management of pneumonia and sepsis. Disposition: Currently not medically stable for discharge.He lives in [place]. Anticipated Disposition: SNF  Objective: Vitals last 24 hrs: Vitals:   10/17/21 0600 10/17/21 0700 10/17/21 0727 10/17/21 0747  BP: 121/82 (!) 168/83    Pulse: 88 78 89   Resp: 19 (!) 25 16   Temp:    98.4 F (36.9 C)  TempSrc:    Axillary  SpO2: 95% 91% 95%   Weight:      Height:       Weight change: -9 kg  Intake/Output Summary (Last 24 hours) at 10/17/2021 [id number] Last data filed at 10/16/2021 1140 Gross per 24 hour  Intake 328.2 ml  Output --  Net 328.2 ml    Net IO Since Admission: -3,556.3 mL [10/17/21 0942]   Physical Examination: General exam: Alert awake able to tell me his name, elderly frail chronically sick looking  HEENT:Oral mucosa moist, Ear/Nose WNL grossly, dentition normal. Respiratory system: bilaterally diminished,  no use of accessory muscle Cardiovascular system: S1 & S2 +, No JVD,. Gastrointestinal system: Abdomen soft,NT,ND, BS+ Nervous System:Alert, awake, moving  extremities and grossly nonfocal Extremities: No edema, distal peripheral pulses palpable.  Skin: No rashes,no icterus. MSK: Normal muscle bulk,tone, power .   Medications reviewed:  Scheduled Meds:  arformoterol  15 mcg Nebulization BID   ARIPiprazole  5 mg Oral QHS   artificial tears   Both Eyes Daily   aspirin  81 mg Oral Daily   B-complex with vitamin C   Oral Daily   budesonide (PULMICORT) nebulizer solution  0.25 mg Nebulization BID   Chlorhexidine Gluconate Cloth  6 each Topical Daily   Chlorhexidine Gluconate Cloth  6 each Topical Daily   clopidogrel  75 mg Oral Daily   heparin injection (subcutaneous)  5,000 Units Subcutaneous Q8H   ipratropium-albuterol  3 mL Nebulization BID   mouth rinse  15 mL Mouth Rinse BID   mupirocin ointment  1 application Nasal BID   sertraline  50 mg Oral Daily   tamsulosin  0.4 mg Oral QHS   Continuous Infusions:  cefTRIAXone (ROCEPHIN)  IV 2 g (10/17/21 0939)    Diet Order             DIET DYS 3 Room service appropriate? Yes; Fluid consistency: Thin  Diet effective now  Weight change: -9 kg  Wt Readings from Last 3 Encounters:  10/17/21 65.9 kg  05/04/16 77.1 kg  05/25/14 78 kg     Consultants:see note  Procedures:see note Antimicrobials: Anti-infectives (From admission, onward)    Start     Dose/Rate Route Frequency Ordered Stop   10/16/21 1000  cefTRIAXone (ROCEPHIN) 2 g in sodium chloride 0.9 % 100 mL IVPB        2 g 200 mL/hr over 30 Minutes Intravenous Every 24 hours 10/16/21 0910 10/18/21 0959   10/12/21 0130  cefTRIAXone (ROCEPHIN) 2 g in sodium chloride 0.9 % 100 mL IVPB        2 g 200 mL/hr over 30 Minutes Intravenous Every 24 hours 10/12/21 0115 10/16/21 1045   10/12/21 0130  azithromycin (ZITHROMAX) 500 mg in sodium chloride 0.9 % 250 mL IVPB        500 mg 250 mL/hr over 60 Minutes Intravenous Every 24 hours 10/12/21 0115 10/16/21 1051      Culture/Microbiology    Component Value Date/Time    SDES BLOOD LEFT HAND 10/14/2021 1106   SPECREQUEST  10/14/2021 1106    BOTTLES DRAWN AEROBIC AND ANAEROBIC Blood Culture adequate volume   CULT  10/14/2021 1106    NO GROWTH 3 DAYS Performed at [facility] [address], [place], [place] [address]    REPTSTATUS PENDING 10/14/2021 1106    Other culture-see note  Unresulted Labs (From admission, onward)     Start     Ordered   10/16/21 0500  CBC  Daily,   R     Question:  Specimen collection method  Answer:  Lab=Lab collect   10/15/21 0610   10/16/21 [id number]  Basic metabolic panel  Daily,   R     Question:  Specimen collection method  Answer:  Lab=Lab collect   10/15/21 0610          Data Reviewed: I have personally reviewed following labs and imaging studies CBC: Recent Labs  Lab 10/12/21 0128 10/12/21 0542 10/12/21 0549 10/13/21 0237 10/14/21 0608 10/15/21 0646 10/16/21 0123 10/17/21 0144  WBC 13.4* 13.4*  --  11.9* 10.9* 10.6* 10.0 10.1  NEUTROABS 12.7* 12.1*  --   --   --   --   --   --   HGB 11.3* 10.8*   < > 10.0* 10.5* 10.7* 10.6* 11.4*  HCT 35.8* 35.8*   < > 32.3* 33.6* 34.3* 32.9* 36.0*  MCV 98.4 101.1*  --  97.0 97.4 96.1 94.5 94.7  PLT 160 154  --  129* 146* 162 161 205   < > = values in this interval not displayed.    Basic Metabolic Panel: Recent Labs  Lab 10/12/21 0542 10/12/21 0549 10/13/21 0237 10/14/21 0608 10/15/21 0646 10/16/21 0123 10/17/21 0144  NA 141   < > 139 142 139 137 138  K 3.8   < > 3.9 3.8 3.8 3.7 3.6  CL 111  --  111 112* 110 110 107  CO2 20*  --  21* 23 21* 20* 20*  GLUCOSE 174*  --  116* 100* 110* 95 103*  BUN 35*  --  31* 26* 21 23 21   CREATININE 1.75*  --  1.51* 1.55* 1.31* 1.27* 1.25*  CALCIUM 9.2  --  9.1 9.2 9.2 9.4 9.7  MG 2.2  --   --   --   --   --   --   PHOS 3.9  --   --   --   --   --   --    < > =  values in this interval not displayed.    GFR: Estimated Creatinine Clearance: 31.5 mL/min (A) (by C-G formula based on SCr of 1.25 mg/dL (H)). Liver  Function Tests: Recent Labs  Lab 10/12/21 0128 10/12/21 0542  AST 16 15  ALT 13 13  ALKPHOS 38 37*  BILITOT 0.6 0.4  PROT 6.6 6.3*  ALBUMIN 3.7 3.4*    No results for input(s): LIPASE, AMYLASE in the last 168 hours. No results for input(s): AMMONIA in the last 168 hours. Coagulation Profile: No results for input(s): INR, PROTIME in the last 168 hours. Cardiac Enzymes: No results for input(s): CKTOTAL, CKMB, CKMBINDEX, TROPONINI in the last 168 hours. BNP (last 3 results) No results for input(s): PROBNP in the last 8760 hours. HbA1C: No results for input(s): HGBA1C in the last 72 hours. CBG: Recent Labs  Lab 10/14/21 2323 10/15/21 0318 10/15/21 1942 10/15/21 2347 10/16/21 0330  GLUCAP 105* 104* 89 87 88    Lipid Profile: No results for input(s): CHOL, HDL, LDLCALC, TRIG, CHOLHDL, LDLDIRECT in the last 72 hours. Thyroid Function Tests: No results for input(s): TSH, T4TOTAL, FREET4, T3FREE, THYROIDAB in the last 72 hours.  Anemia Panel: No results for input(s): VITAMINB12, FOLATE, FERRITIN, TIBC, IRON, RETICCTPCT in the last 72 hours. Sepsis Labs: Recent Labs  Lab 10/12/21 0128 10/12/21 0542 10/12/21 1816  PROCALCITON  --  <0.10  --   LATICACIDVEN 2.0* 2.7* 2.0*     Recent Results (from the past 240 hour(s))  Resp Panel by RT-PCR (Flu A&B, Covid) Nasopharyngeal Swab     Status: None   Collection Time: 10/11/21 11:11 PM   Specimen: Nasopharyngeal Swab; Nasopharyngeal(NP) swabs in vial transport medium  Result Value Ref Range Status   SARS Coronavirus 2 by RT PCR NEGATIVE NEGATIVE Final    Comment: (NOTE) SARS-CoV-2 target nucleic acids are NOT DETECTED.  The SARS-CoV-2 RNA is generally detectable in upper respiratory specimens during the acute phase of infection. The lowest concentration of SARS-CoV-2 viral copies this assay can detect is 138 copies/mL. A negative result does not preclude SARS-Cov-2 infection and should not be used as the sole basis for  treatment or other patient management decisions. A negative result may occur with  improper specimen collection/handling, submission of specimen other than nasopharyngeal swab, presence of viral mutation(s) within the areas targeted by this assay, and inadequate number of viral copies(<138 copies/mL). A negative result must be combined with clinical observations, patient history, and epidemiological information. The expected result is Negative.  Fact Sheet for Patients:  EntrepreneurPulse.com.au  Fact Sheet for Healthcare Providers:  IncredibleEmployment.be  This test is no t yet approved or cleared by the [place] FDA and  has been authorized for detection and/or diagnosis of SARS-CoV-2 by FDA under an Emergency Use Authorization (EUA). This EUA will remain  in effect (meaning this test can be used) for the duration of the COVID-19 declaration under Section 564(b)(1) of the Act, 21 U.S.C.section 360bbb-3(b)(1), unless the authorization is terminated  or revoked sooner.       Influenza A by PCR NEGATIVE NEGATIVE Final   Influenza B by PCR NEGATIVE NEGATIVE Final    Comment: (NOTE) The Xpert Xpress SARS-CoV-2/FLU/RSV plus assay is intended as an aid in the diagnosis of influenza from Nasopharyngeal swab specimens and should not be used as a sole basis for treatment. Nasal washings and aspirates are unacceptable for Xpert Xpress SARS-CoV-2/FLU/RSV testing.  Fact Sheet for Patients: EntrepreneurPulse.com.au  Fact Sheet for Healthcare Providers: IncredibleEmployment.be  This test is not  yet approved or cleared by the [place] and has been authorized for detection and/or diagnosis of SARS-CoV-2 by FDA under an Emergency Use Authorization (EUA). This EUA will remain in effect (meaning this test can be used) for the duration of the COVID-19 declaration under Section 564(b)(1) of the Act, 21  U.S.C. section 360bbb-3(b)(1), unless the authorization is terminated or revoked.  Performed at [facility], [place] [address], [place], [place] [address]   Blood culture (routine x 2)     Status: Abnormal   Collection Time: 10/12/21  1:23 AM   Specimen: BLOOD  Result Value Ref Range Status   Specimen Description BLOOD RIGHT ANTECUBITAL  Final   Special Requests   Final    BOTTLES DRAWN AEROBIC AND ANAEROBIC Blood Culture adequate volume   Culture  Setup Time   Final    GRAM POSITIVE COCCI IN BOTH AEROBIC AND ANAEROBIC BOTTLES CRITICAL RESULT CALLED TO, READ BACK BY AND VERIFIED WITH: PHARMD GREG ABBOTT 10/13/21@5 :79 BY TW    Culture (A)  Final    STAPHYLOCOCCUS CAPITIS THE SIGNIFICANCE OF ISOLATING THIS ORGANISM FROM A SINGLE VENIPUNCTURE CANNOT BE PREDICTED WITHOUT FURTHER CLINICAL AND CULTURE CORRELATION. SUSCEPTIBILITIES AVAILABLE ONLY ON REQUEST. Performed at [facility], [place] [address] [place], [place] [address]    Report Status 10/15/2021 FINAL  Final  Blood Culture ID Panel (Reflexed)     Status: Abnormal   Collection Time: 10/12/21  1:23 AM  Result Value Ref Range Status   Enterococcus faecalis NOT DETECTED NOT DETECTED Final   Enterococcus Faecium NOT DETECTED NOT DETECTED Final   Listeria monocytogenes NOT DETECTED NOT DETECTED Final   Staphylococcus species DETECTED (A) NOT DETECTED Final    Comment: CRITICAL RESULT CALLED TO, READ BACK BY AND VERIFIED WITH: PHARMD GREG ABBOTT 10/13/21@5 :41 BY TW    Staphylococcus aureus (BCID) NOT DETECTED NOT DETECTED Final   Staphylococcus epidermidis NOT DETECTED NOT DETECTED Final   Staphylococcus lugdunensis NOT DETECTED NOT DETECTED Final   Streptococcus species NOT DETECTED NOT DETECTED Final   Streptococcus agalactiae NOT DETECTED NOT DETECTED Final   Streptococcus pneumoniae NOT DETECTED NOT DETECTED Final   Streptococcus pyogenes NOT DETECTED NOT DETECTED Final   A.calcoaceticus-baumannii NOT DETECTED NOT  DETECTED Final   Bacteroides fragilis NOT DETECTED NOT DETECTED Final   Enterobacterales NOT DETECTED NOT DETECTED Final   Enterobacter cloacae complex NOT DETECTED NOT DETECTED Final   Escherichia coli NOT DETECTED NOT DETECTED Final   Klebsiella aerogenes NOT DETECTED NOT DETECTED Final   Klebsiella oxytoca NOT DETECTED NOT DETECTED Final   Klebsiella pneumoniae NOT DETECTED NOT DETECTED Final   Proteus species NOT DETECTED NOT DETECTED Final   Salmonella species NOT DETECTED NOT DETECTED Final   Serratia marcescens NOT DETECTED NOT DETECTED Final   Haemophilus influenzae NOT DETECTED NOT DETECTED Final   Neisseria meningitidis NOT DETECTED NOT DETECTED Final   Pseudomonas aeruginosa NOT DETECTED NOT DETECTED Final   Stenotrophomonas maltophilia NOT DETECTED NOT DETECTED Final   Candida albicans NOT DETECTED NOT DETECTED Final   Candida auris NOT DETECTED NOT DETECTED Final   Candida glabrata NOT DETECTED NOT DETECTED Final   Candida krusei NOT DETECTED NOT DETECTED Final   Candida parapsilosis NOT DETECTED NOT DETECTED Final   Candida tropicalis NOT DETECTED NOT DETECTED Final   Cryptococcus neoformans/gattii NOT DETECTED NOT DETECTED Final    Comment: Performed at [facility], [address], [place], [place] [address]  MRSA Next Gen by PCR,  Nasal     Status: Abnormal   Collection Time: 10/13/21  6:12 PM   Specimen: Nasal Mucosa; Nasal Swab  Result Value Ref Range Status   MRSA by PCR Next Gen DETECTED (A) NOT DETECTED Final    Comment: CRITICAL RESULT CALLED TO, READ BACK BY AND VERIFIED WITH: MONICA CROSS RN 10/13/2021 @2100  BY JW (NOTE) The GeneXpert MRSA Assay (FDA approved for NASAL specimens only), is one component of a comprehensive MRSA colonization surveillance program. It is not intended to diagnose MRSA infection nor to guide or monitor treatment for MRSA infections. Test performance is not FDA approved in patients less than 5 years old. Performed at [facility], [place] [address], [place], [place] [address]   Culture, blood (Routine X 2) w Reflex to ID Panel     Status: None (Preliminary result)   Collection Time: 10/14/21 10:50 AM   Specimen: BLOOD  Result Value Ref Range Status   Specimen Description BLOOD LEFT ANTECUBITAL  Final   Special Requests   Final    BOTTLES DRAWN AEROBIC AND ANAEROBIC Blood Culture adequate volume   Culture   Final    NO GROWTH 3 DAYS Performed at [facility], [place] [address], [place], [place] [address]    Report Status PENDING  Incomplete  Culture, blood (Routine X 2) w Reflex to ID Panel     Status: None (Preliminary result)   Collection Time: 10/14/21 11:06 AM   Specimen: BLOOD LEFT HAND  Result Value Ref Range Status   Specimen Description BLOOD LEFT HAND  Final   Special Requests   Final    BOTTLES DRAWN AEROBIC AND ANAEROBIC Blood Culture adequate volume   Culture   Final    NO GROWTH 3 DAYS Performed at [facility], [place] [address], [place], [place] [address]    Report Status PENDING  Incomplete      Radiology Studies: ECHOCARDIOGRAM COMPLETE  Result Date: 10/16/2021    ECHOCARDIOGRAM REPORT   Patient Name:   LESTON HOLTHUS Date of Exam: 10/16/2021 Medical Rec #:  [id number]       Height:       67.0 in Accession #:    [id number]      Weight:       165.1 lb Date of Birth:  10-25-1922       BSA:          1.864 m Patient Age:    48 years        BP:           144/80 mmHg Patient Gender: M               HR:           88 bpm. Exam Location:  Inpatient Procedure: 2D Echo, Cardiac Doppler and Color Doppler Indications:    Atrial fibrillation  History:        Patient has no prior history of Echocardiogram examinations.                 Arrythmias:Atrial Fibrillation; Signs/Symptoms:Altered Mental                 Status.  Sonographer:    Merrie Roof RDCS Referring Phys: [id number] [facility]  Sonographer Comments: No subcostal window. Image acquisition challenging due to uncooperative patient.  IMPRESSIONS  1. Left ventricular ejection fraction, by estimation, is 55 to 60%. The left ventricle has normal function. The left ventricle has no regional  wall motion abnormalities. There is moderate asymmetric left ventricular hypertrophy of the basal-septal segment. Left ventricular diastolic parameters are indeterminate.  2. Right ventricular systolic function is mildly reduced. The right ventricular size is normal. There is mildly elevated pulmonary artery systolic pressure.  3. Left atrial size was moderately dilated.  4. Right atrial size was moderately dilated.  5. The mitral valve is normal in structure. No evidence of mitral valve regurgitation. No evidence of mitral stenosis.  6. Tricuspid valve regurgitation is mild to moderate.  7. The aortic valve is tricuspid. Aortic valve regurgitation is mild. Aortic valve sclerosis/calcification is present, without any evidence of aortic stenosis.  8. Aortic dilatation noted. Aneurysm of the ascending aorta, measuring 45 mm. There is mild dilatation of the aortic root, measuring 40 mm. FINDINGS  Left Ventricle: Left ventricular ejection fraction, by estimation, is 55 to 60%. The left ventricle has normal function. The left ventricle has no regional wall motion abnormalities. The left ventricular internal cavity size was normal in size. There is  moderate asymmetric left ventricular hypertrophy of the basal-septal segment. Left ventricular diastolic parameters are indeterminate. Right Ventricle: The right ventricular size is normal. Right vetricular wall thickness was not well visualized. Right ventricular systolic function is mildly reduced. There is mildly elevated pulmonary artery systolic pressure. Left Atrium: Left atrial size was moderately dilated. Right Atrium: Right atrial size was moderately dilated. Pericardium: Trivial pericardial effusion is present. Mitral Valve: The mitral valve is normal in structure. No evidence of mitral valve regurgitation. No  evidence of mitral valve stenosis. Tricuspid Valve: The tricuspid valve is normal in structure. Tricuspid valve regurgitation is mild to moderate. Aortic Valve: The aortic valve is tricuspid. Aortic valve regurgitation is mild. Aortic valve sclerosis/calcification is present, without any evidence of aortic stenosis. Aortic valve mean gradient measures 6.0 mmHg. Aortic valve peak gradient measures 11.8 mmHg. Aortic valve area, by VTI measures 2.04 cm. Pulmonic Valve: The pulmonic valve was not well visualized. Pulmonic valve regurgitation is trivial. Aorta: Aortic dilatation noted. There is mild dilatation of the aortic root, measuring 40 mm. There is an aneurysm involving the ascending aorta measuring 45 mm. IAS/Shunts: The interatrial septum was not well visualized.  LEFT VENTRICLE PLAX 2D LVIDd:         4.90 cm LVIDs:         3.30 cm LV PW:         1.10 cm LV IVS:        1.10 cm LVOT diam:     2.00 cm LV SV:         64 LV SV Index:   34 LVOT Area:     3.14 cm  RIGHT VENTRICLE RV Basal diam:  3.40 cm LEFT ATRIUM             Index        RIGHT ATRIUM           Index LA diam:        3.40 cm 1.82 cm/m   RA Area:     25.70 cm LA Vol (A2C):   78.9 ml 42.33 ml/m  RA Volume:   72.90 ml  39.11 ml/m LA Vol (A4C):   69.1 ml 37.07 ml/m LA Biplane Vol: 77.6 ml 41.63 ml/m  AORTIC VALVE AV Area (Vmax):    1.94 cm AV Area (Vmean):   1.99 cm AV Area (VTI):     2.04 cm AV Vmax:  172.00 cm/s AV Vmean:          115.000 cm/s AV VTI:            0.313 m AV Peak Grad:      11.8 mmHg AV Mean Grad:      6.0 mmHg LVOT Vmax:         106.00 cm/s LVOT Vmean:        72.900 cm/s LVOT VTI:          0.203 m LVOT/AV VTI ratio: 0.65  AORTA Ao Root diam: 3.95 cm Ao Asc diam:  4.50 cm TRICUSPID VALVE TR Peak grad:   35.3 mmHg TR Vmax:        297.00 cm/s  SHUNTS Systemic VTI:  0.20 m Systemic Diam: 2.00 cm Oswaldo Milian MD Electronically signed by Oswaldo Milian MD Signature Date/Time: 10/16/2021/7:11:48 PM    Final       LOS: 5 days   Antonieta Pert, MD [facility]  10/17/2021, 9:42 AM

## 2021-10-17 NOTE — Evaluation (Signed)
Physical Therapy Evaluation Patient Details Name: Andrew LackWilliam L Frey MRN: [id number] DOB: November 17, 1921 Today's Date: 10/17/2021  History of Present Illness  87 y/o male admitted on 11/30 due to SOB with COPD exacerbation. Moved to ICU 12/2 with increased respiratory distress and agitation.  PMHx: CHF, COPD, dementia, anemia, BPH, CAD, DJD, HLD, HTN, and spinal stenosis  Clinical Impression  Pt pleasant but HOH and confused wanting to call the courthouse. Pt very difficult to communicate with and no family present. Per RN pt lives at ALF and walks to dining hall with no other PLOF available at this time. Pt able to walk limited distance with HR up to 140 and need for 4L with gait to maintain sats >90%. Pt with decreased cognition, strength, tranfers and mobility who will benefit from acute therapy to maximize mobility, safety and function to decrease burden of care.  At rest HR 102, SPO2 97% on 2L With gait HR 140 and SpO2 93% on 4L       Recommendations for follow up therapy are one component of a multi-disciplinary discharge planning process, led by the attending physician.  Recommendations may be updated based on patient status, additional functional criteria and insurance authorization.  Follow Up Recommendations Skilled nursing-short term rehab (<3 hours/day)    Assistance Recommended at Discharge Frequent or constant Supervision/Assistance  Functional Status Assessment Patient has had a recent decline in their functional status and/or demonstrates limited ability to make significant improvements in function in a reasonable and predictable amount of time  Equipment Recommendations  None recommended by PT    Recommendations for Other Services       Precautions / Restrictions Precautions Precautions: Fall;Other (comment) Precaution Comments: watch sats and HR      Mobility  Bed Mobility Overal bed mobility: Needs Assistance Bed Mobility: Supine to Sit     Supine to sit: Min  assist;HOB elevated     General bed mobility comments: HOb 20 degrees with min assist to transition to sitting EOb on right    Transfers Overall transfer level: Needs assistance   Transfers: Sit to/from Stand Sit to Stand: Min assist           General transfer comment: cues for safety with use of belt and RW, decreased control of descent to surface    Ambulation/Gait Ambulation/Gait assistance: Min assist;+2 safety/equipment Gait Distance (Feet): 75 Feet Assistive device: Rolling walker (2 wheels) Gait Pattern/deviations: Step-through pattern;Decreased stride length;Trunk flexed;Narrow base of support   Gait velocity interpretation: <1.8 ft/sec, indicate of risk for recurrent falls   General Gait Details: pt with very kyphotic posture with reliance on RW and assist for balance and direction of RW. Pt required bump from 2-4L during gait due to desaturation to 87% on 2L and 93% on 4L with gait. Tactile and gestural cues for direction and safety with +2 for line management  Stairs            Wheelchair Mobility    Modified Rankin (Stroke Patients Only)       Balance Overall balance assessment: Needs assistance Sitting-balance support: Feet supported;No upper extremity supported Sitting balance-Leahy Scale: Fair Sitting balance - Comments: EOb with guarding for safety     Standing balance-Leahy Scale: Poor Standing balance comment: bil UE support on RW                             Pertinent Vitals/Pain Pain Assessment: PAINAD Breathing: occasional labored breathing, short period  of hyperventilation Negative Vocalization: none Facial Expression: smiling or inexpressive Body Language: relaxed Consolability: no need to console PAINAD Score: 1 Facial Expression: Relaxed, neutral Body Movements: Protection Muscle Tension: Tense, rigid Compliance with ventilator (intubated pts.): N/A Vocalization (extubated pts.): Crying out, sobbing CPOT Total: 4     Home Living Family/patient expects to be discharged to:: Assisted living                 Home Equipment: Agricultural consultant (2 wheels)      Prior Function Prior Level of Function : Needs assist             Mobility Comments: walks to dining hall with walker per daughter report to RN       Hand Dominance        Extremity/Trunk Assessment   Upper Extremity Assessment Upper Extremity Assessment: Generalized weakness    Lower Extremity Assessment Lower Extremity Assessment: Generalized weakness    Cervical / Trunk Assessment Cervical / Trunk Assessment: Kyphotic  Communication   Communication: HOH;Other (comment) (questionable vision)  Cognition Arousal/Alertness: Awake/alert Behavior During Therapy: Flat affect Overall Cognitive Status: No family/caregiver present to determine baseline cognitive functioning                                 General Comments: Pt extremely HOH with hearing aids in and questionable vision. Pt perseverating on wanting to call the courthouse but response to tactile and gestural commands        General Comments      Exercises     Assessment/Plan    PT Assessment Patient needs continued PT services  PT Problem List Decreased strength;Decreased mobility;Decreased safety awareness;Decreased activity tolerance;Decreased balance;Decreased knowledge of use of DME;Decreased cognition;Cardiopulmonary status limiting activity;Decreased coordination       PT Treatment Interventions DME instruction;Therapeutic exercise;Gait training;Functional mobility training;Therapeutic activities;Patient/family education;Cognitive remediation;Neuromuscular re-education;Balance training    PT Goals (Current goals can be found in the Care Plan section)  Acute Rehab PT Goals PT Goal Formulation: Patient unable to participate in goal setting Time For Goal Achievement: 10/31/21 Potential to Achieve Goals: Fair    Frequency Min  2X/week   Barriers to discharge Decreased caregiver support      Co-evaluation               AM-PAC PT "6 Clicks" Mobility  Outcome Measure Help needed turning from your back to your side while in a flat bed without using bedrails?: A Little Help needed moving from lying on your back to sitting on the side of a flat bed without using bedrails?: A Little Help needed moving to and from a bed to a chair (including a wheelchair)?: A Lot Help needed standing up from a chair using your arms (e.g., wheelchair or bedside chair)?: A Little Help needed to walk in hospital room?: A Lot Help needed climbing 3-5 steps with a railing? : Total 6 Click Score: 14    End of Session Equipment Utilized During Treatment: Gait belt;Oxygen Activity Tolerance: Patient tolerated treatment well Patient left: in chair;with call bell/phone within reach;with chair alarm set Nurse Communication: Mobility status PT Visit Diagnosis: Other abnormalities of gait and mobility (R26.89);Difficulty in walking, not elsewhere classified (R26.2);Muscle weakness (generalized) (M62.81)    Time: 8413-2440 PT Time Calculation (min) (ACUTE ONLY): 27 min   Charges:   PT Evaluation $PT Eval Moderate Complexity: 1 Mod PT Treatments $Gait Training: 8-22 mins  [place] Pager: [phone] Office: [place] 10/17/2021, 10:22 AM

## 2021-10-17 NOTE — Consult Note (Signed)
Cardiology Consultation:   Patient ID: Andrew Frey MRN: [id number]; DOB: 1922/09/08  Admit date: 10/11/2021 Date of Consult: 10/17/2021  PCP:  Philemon Kingdom, MD   [facility] Providers Cardiologist:  Dr. Dulce Sellar   Patient Profile:   Andrew Frey is a 87 y.o. male with a hx of CAD s/p intervention in 2004, chronic diastolic CHF, ascending aortic aneurysm, hypertension, hyperlipidemia, and dementia who is being seen 10/17/2021 for the evaluation of atrial fibrillation at the request of Kc, Dayna Barker, MD.  Patient with history of CAD s/p PCI in 2004 at [facility] and RV infarct in 2014 with CTO of small dominant RCA.  This was treated medically.  Last note by Dr. Dulce Sellar June 2017.  History of Present Illness:   Andrew Frey presented from [facility] assisted living with respiratory distress on October 11, 2021.  He was admitted for acute hypoxic respiratory failure secondary to sepsis and aspiration pneumonia.  Urine drug screen concerning for UTI.  He was treated with broad-spectrum antibiotic and being followed by critical care team.  Patient has advanced dementia at baseline.  He was seen by palliative care and remain full code.  Patient went into atrial fibrillation with rapid ventricular rate 12/4.  Given IV labetalol.  Felt high risk for anticoagulation given chronic anemia and thrombocytopenia. TSH normal.  Echocardiogram yesterday showed LV function of 55 to 60%, intermediate diastolic dysfunction.  No regional wall motion abnormality.  Moderately dilated left and right atrial size.   Past Medical History:  Diagnosis Date   Anemia, unspecified    BPH (benign prostatic hyperplasia)    CAD (coronary artery disease)    Calculus of gallbladder without mention of cholecystitis or obstruction    DDD (degenerative disc disease)    DJD (degenerative joint disease)    Dyspnea    HLD (hyperlipidemia)    HTN (hypertension)    Low testosterone    Lower back pain     MI (myocardial infarction) (HCC)    Osteoarthritis    Other testicular hypofunction    Spinal stenosis     Past Surgical History:  Procedure Laterality Date   APPENDECTOMY  1965   stent placed  2013   TOTAL HIP ARTHROPLASTY     TOTAL KNEE ARTHROPLASTY  2007   left   TOTAL KNEE ARTHROPLASTY  2005   right     Inpatient Medications: Scheduled Meds:  arformoterol  15 mcg Nebulization BID   ARIPiprazole  5 mg Oral QHS   artificial tears   Both Eyes Daily   aspirin  81 mg Oral Daily   B-complex with vitamin C   Oral Daily   budesonide (PULMICORT) nebulizer solution  0.25 mg Nebulization BID   Chlorhexidine Gluconate Cloth  6 each Topical Daily   Chlorhexidine Gluconate Cloth  6 each Topical Daily   clopidogrel  75 mg Oral Daily   diltiazem  120 mg Oral Daily   heparin injection (subcutaneous)  5,000 Units Subcutaneous Q8H   ipratropium-albuterol  3 mL Nebulization BID   mouth rinse  15 mL Mouth Rinse BID   mupirocin ointment  1 application Nasal BID   sertraline  50 mg Oral Daily   tamsulosin  0.4 mg Oral QHS   Continuous Infusions:  PRN Meds: acetaminophen **OR** acetaminophen, albuterol, guaiFENesin-dextromethorphan, LORazepam  Allergies:   No Known Allergies  Social History:   Social History   Socioeconomic History   Marital status: Widowed    Spouse name: Not on file  Number of children: 2   Years of education: Not on file   Highest education level: Not on file  Occupational History   Occupation: retired    Comment: truck Hospital doctor, agriculture   Tobacco Use   Smoking status: Never   Smokeless tobacco: Never  Substance and Sexual Activity   Alcohol use: No   Drug use: No   Sexual activity: Not on file  Other Topics Concern   Not on file  Social History Narrative   Not on file   Social Determinants of Health   Financial Resource Strain: Not on file  Food Insecurity: Not on file  Transportation Needs: Not on file  Physical Activity: Not on file   Stress: Not on file  Social Connections: Not on file  Intimate Partner Violence: Not on file    Family History:   Family History  Problem Relation Age of Onset   Cancer Father    Hypertension Mother    Other Mother        cerebral hemorrhage   Heart attack Other        sibling   Heart disease Other        sibling   Hypertension Other        child   Stroke Mother      ROS:  Please see the history of present illness.  All other ROS reviewed and negative.     Physical Exam/Data:   Vitals:   10/17/21 1100 10/17/21 1113 10/17/21 1200 10/17/21 1220  BP:   (!) 160/143 123/66  Pulse: 100  (!) 116 (!) 102  Resp: (!) 21  (!) 28 17  Temp:  98.4 F (36.9 C)  98.3 F (36.8 C)  TempSrc:  Oral  Oral  SpO2: 95%  97% 97%  Weight:      Height:        Intake/Output Summary (Last 24 hours) at 10/17/2021 1345 Last data filed at 10/17/2021 1224 Gross per 24 hour  Intake --  Output 450 ml  Net -450 ml   Last 3 Weights 10/17/2021 10/16/2021 10/15/2021  Weight (lbs) 145 lb 4.5 oz 165 lb 2 oz 169 lb 1.5 oz  Weight (kg) 65.9 kg 74.9 kg 76.7 kg     Body mass index is 22.75 kg/m.  General: Ill-appearing elderly male in no acute distress HEENT: normal Neck: no JVD Vascular: No carotid bruits; Distal pulses 2+ bilaterally Cardiac:  normal S1, S2; IR IR, no murmur Lungs:  Diminished breath sound  Abd: soft, nontender, no hepatomegaly  Ext: no edema Musculoskeletal:  No deformities Skin: warm and dry  Neuro: no focal abnormalities noted, does not follow commands  Psych:  dementia   EKG:  The EKG 10/11/21 was personally reviewed and demonstrates:  Sinus rhythm  Telemetry:  Telemetry was personally reviewed and demonstrates: Atrial fibrillation at a heart rate of 100  Relevant CV Studies:  Echo 10/16/21  1. Left ventricular ejection fraction, by estimation, is 55 to 60%. The  left ventricle has normal function. The left ventricle has no regional  wall motion abnormalities. There is  moderate asymmetric left ventricular  hypertrophy of the basal-septal  segment. Left ventricular diastolic parameters are indeterminate.   2. Right ventricular systolic function is mildly reduced. The right  ventricular size is normal. There is mildly elevated pulmonary artery  systolic pressure.   3. Left atrial size was moderately dilated.   4. Right atrial size was moderately dilated.   5. The mitral valve is normal  in structure. No evidence of mitral valve  regurgitation. No evidence of mitral stenosis.   6. Tricuspid valve regurgitation is mild to moderate.   7. The aortic valve is tricuspid. Aortic valve regurgitation is mild.  Aortic valve sclerosis/calcification is present, without any evidence of  aortic stenosis.   8. Aortic dilatation noted. Aneurysm of the ascending aorta, measuring 45  mm. There is mild dilatation of the aortic root, measuring 40 mm.   Laboratory Data:  High Sensitivity Troponin:  No results for input(s): TROPONINIHS in the last 720 hours.   Chemistry Recent Labs  Lab 10/12/21 0542 10/12/21 0549 10/15/21 0646 10/16/21 0123 10/17/21 0144  NA 141   < > 139 137 138  K 3.8   < > 3.8 3.7 3.6  CL 111   < > 110 110 107  CO2 20*   < > 21* 20* 20*  GLUCOSE 174*   < > 110* 95 103*  BUN 35*   < > CREATININE 1.75*   < > 1.31* 1.27* 1.25*  CALCIUM 9.2   < > 9.2 9.4 9.7  MG 2.2  --   --   --   --   GFRNONAA 35*   < > 50* 51* 52*  ANIONGAP 10   < > < > = values in this interval not displayed.    Recent Labs  Lab 10/12/21 0128 10/12/21 0542  PROT 6.6 6.3*  ALBUMIN 3.7 3.4*  AST 16 15  ALT 13 13  ALKPHOS 38 37*  BILITOT 0.6 0.4   Lipids No results for input(s): CHOL, TRIG, HDL, LABVLDL, LDLCALC, CHOLHDL in the last 168 hours.  Hematology Recent Labs  Lab 10/15/21 0646 10/16/21 0123 10/17/21 0144  WBC 10.6* 10.0 10.1  RBC 3.57* 3.48* 3.80*  HGB 10.7* 10.6* 11.4*  HCT 34.3* 32.9* 36.0*  MCV 96.1 94.5 94.7  MCH 30.0 30.5  30.0  MCHC 31.2 32.2 31.7  RDW 13.4 13.4 13.5  PLT 162 161 205   Thyroid  Recent Labs  Lab 10/14/21 0608  TSH 1.278    BNP Recent Labs  Lab 10/12/21 0128  BNP 154.4*    DDimer No results for input(s): DDIMER in the last 168 hours.   Radiology/Studies:  DG Chest Port 1 View  Result Date: 10/14/2021 CLINICAL DATA:  Respiratory failure, shortness of breath. EXAM: PORTABLE CHEST 1 VIEW COMPARISON:  Chest x-rays dated 10/13/2021 and 10/11/2021. FINDINGS: Stable cardiomegaly. Stable appearance of the patchy bilateral airspace opacities and interstitial prominence. Stable appearance of the probable bibasilar atelectasis and/or small pleural effusions. IMPRESSION: Stable chest x-ray. Persistent patchy bilateral airspace opacities and interstitial prominence suggesting multifocal pneumonia versus pulmonary edema. Probable bibasilar atelectasis and/or small pleural effusions. Electronically Signed   By: Bary Richard M.D.   On: 10/14/2021 08:00   DG Chest Port 1 View  Result Date: 10/13/2021 CLINICAL DATA:  Shortness of breath and cough, history coronary artery disease post MI, hypertension, severe sepsis EXAM: PORTABLE CHEST 1 VIEW COMPARISON:  Portable exam 1653 hours compared to 10/11/2021 FINDINGS: Enlargement of cardiac silhouette with vascular congestion. Tortuous thoracic aorta. BILATERAL patchy pulmonary infiltrates favoring multifocal pneumonia. Small LEFT pleural effusion. No pneumothorax. Bones demineralized. IMPRESSION: Enlargement of cardiac silhouette with vascular congestion. Patchy pulmonary infiltrates favoring multifocal pneumonia. Electronically Signed   By: Ulyses Southward M.D.   On: 10/13/2021 17:16   ECHOCARDIOGRAM COMPLETE  Result Date: 10/16/2021    ECHOCARDIOGRAM REPORT   Patient Name:  [name] Date of Exam: 10/16/2021 Medical Rec #:  [id number]       Height:       67.0 in Accession #:    [id number]      Weight:       165.1 lb Date of Birth:  [birth year]       BSA:           1.864 m Patient Age:    89 years        BP:           144/80 mmHg Patient Gender: M               HR:           88 bpm. Exam Location:  Inpatient Procedure: 2D Echo, Cardiac Doppler and Color Doppler Indications:    Atrial fibrillation  History:        Patient has no prior history of Echocardiogram examinations.                 Arrythmias:Atrial Fibrillation; Signs/Symptoms:Altered Mental                 Status.  Sonographer:    Roosvelt Maser RDCS Referring Phys: [id number] [facility]  Sonographer Comments: No subcostal window. Image acquisition challenging due to uncooperative patient. IMPRESSIONS  1. Left ventricular ejection fraction, by estimation, is 55 to 60%. The left ventricle has normal function. The left ventricle has no regional wall motion abnormalities. There is moderate asymmetric left ventricular hypertrophy of the basal-septal segment. Left ventricular diastolic parameters are indeterminate.  2. Right ventricular systolic function is mildly reduced. The right ventricular size is normal. There is mildly elevated pulmonary artery systolic pressure.  3. Left atrial size was moderately dilated.  4. Right atrial size was moderately dilated.  5. The mitral valve is normal in structure. No evidence of mitral valve regurgitation. No evidence of mitral stenosis.  6. Tricuspid valve regurgitation is mild to moderate.  7. The aortic valve is tricuspid. Aortic valve regurgitation is mild. Aortic valve sclerosis/calcification is present, without any evidence of aortic stenosis.  8. Aortic dilatation noted. Aneurysm of the ascending aorta, measuring 45 mm. There is mild dilatation of the aortic root, measuring 40 mm. FINDINGS  Left Ventricle: Left ventricular ejection fraction, by estimation, is 55 to 60%. The left ventricle has normal function. The left ventricle has no regional wall motion abnormalities. The left ventricular internal cavity size was normal in size. There is  moderate asymmetric left ventricular  hypertrophy of the basal-septal segment. Left ventricular diastolic parameters are indeterminate. Right Ventricle: The right ventricular size is normal. Right vetricular wall thickness was not well visualized. Right ventricular systolic function is mildly reduced. There is mildly elevated pulmonary artery systolic pressure. Left Atrium: Left atrial size was moderately dilated. Right Atrium: Right atrial size was moderately dilated. Pericardium: Trivial pericardial effusion is present. Mitral Valve: The mitral valve is normal in structure. No evidence of mitral valve regurgitation. No evidence of mitral valve stenosis. Tricuspid Valve: The tricuspid valve is normal in structure. Tricuspid valve regurgitation is mild to moderate. Aortic Valve: The aortic valve is tricuspid. Aortic valve regurgitation is mild. Aortic valve sclerosis/calcification is present, without any evidence of aortic stenosis. Aortic valve mean gradient measures 6.0 mmHg. Aortic valve peak gradient measures 11.8 mmHg. Aortic valve area, by VTI measures 2.04 cm. Pulmonic Valve: The pulmonic valve was not well visualized. Pulmonic valve regurgitation is trivial. Aorta: Aortic dilatation noted. There is mild  dilatation of the aortic root, measuring 40 mm. There is an aneurysm involving the ascending aorta measuring 45 mm. IAS/Shunts: The interatrial septum was not well visualized.  LEFT VENTRICLE PLAX 2D LVIDd:         4.90 cm LVIDs:         3.30 cm LV PW:         1.10 cm LV IVS:        1.10 cm LVOT diam:     2.00 cm LV SV:         64 LV SV Index:   34 LVOT Area:     3.14 cm  RIGHT VENTRICLE RV Basal diam:  3.40 cm LEFT ATRIUM             Index        RIGHT ATRIUM           Index LA diam:        3.40 cm 1.82 cm/m   RA Area:     25.70 cm LA Vol (A2C):   78.9 ml 42.33 ml/m  RA Volume:   72.90 ml  39.11 ml/m LA Vol (A4C):   69.1 ml 37.07 ml/m LA Biplane Vol: 77.6 ml 41.63 ml/m  AORTIC VALVE AV Area (Vmax):    1.94 cm AV Area (Vmean):   1.99 cm  AV Area (VTI):     2.04 cm AV Vmax:           172.00 cm/s AV Vmean:          115.000 cm/s AV VTI:            0.313 m AV Peak Grad:      11.8 mmHg AV Mean Grad:      6.0 mmHg LVOT Vmax:         106.00 cm/s LVOT Vmean:        72.900 cm/s LVOT VTI:          0.203 m LVOT/AV VTI ratio: 0.65  AORTA Ao Root diam: 3.95 cm Ao Asc diam:  4.50 cm TRICUSPID VALVE TR Peak grad:   35.3 mmHg TR Vmax:        297.00 cm/s  SHUNTS Systemic VTI:  0.20 m Systemic Diam: 2.00 cm Epifanio Lesches MD Electronically signed by Epifanio Lesches MD Signature Date/Time: 10/16/2021/7:11:48 PM    Final      Assessment and Plan:   New onset atrial fibrillation -This is likely secondary to underlying pulmonary issue.  He was in a sinus rhythm on arrival however went into A. fib RVR December 4.  Currently heart rate relatively stable.  He did not anticoagulated due to high bleeding risk. -Patient has advanced dementia and does not follow any commands -Would be beneficial for rate control strategy only heart rate will improve with resolution of underlying lung issue -TSH normal -Echocardiogram with preserved LV function -Could consider adding Cardizem for rate control agent.  His blood pressure is stable - I don't think he is an anticoagulation candidate. He is very high risk for fall and bleeding. MD to comment.   2. CAD  -Remote stenting. -Continue ASA and Plavix  - Could DC Plavix at some point based on palliative discussion   Risk Assessment/Risk Scores:   CHA2DS2-VASc Score = 5   This indicates a 7.2% annual risk of stroke. The patient's score is based upon: CHF History: 1 HTN History: 1 Diabetes History: 0 Stroke History: 0 Vascular Disease History: 1 Age Score: 2 Gender Score: 0  For questions or updates, please contact [facility] Please consult [email or web address] for contact info under    Lorelei Pont, PA  10/17/2021 1:45 PM

## 2021-10-17 NOTE — Progress Notes (Addendum)
Clinical Impression: Pt presents with a baseline cough that makes clinical assessment of swallowing more limited. Coughing persists throughout PO intake, typically with a brief delay after a swallow is felt to palpation. He is deconditioned and does not engage in self-feeding, with reduced awareness for bolus acceptance via spoon and straw. Once boluses enter his mouth he starts to manipulate and swallow them a little more automatically, but he does still have moderate oral residue with purees and regular solids. Given signs of dysphagia, current mentation (likely also influenced by his hearing), and acute deconditioning, he could be at increased risk for aspiration at this time.   SLP spoke with pt's daughter via phone for an extensive conversation (~60 minutes) regarding swallowing. She says he self-feeds at baseline and consumes soft solids/thin liquids without any overt signs of difficulty. Although he has had PNA in the past, it has been several years. She has no concerns about his baseline level of swallowing, and shares that there have been times during this hospital stay that he has also appeared to do well.  Education was provided about the potential for decline in performance during acute illness as well as fluctuating status that would also bring fluctuating risk. MBS was discussed as a means of getting a closer look at oropharyngeal swallowing and aspiration risk, and she would like to consider if she wants to do this test here or on an outpatient basis, as one of her main priorities is to get him back to his facility as soon as possible as he tends to decline more the longer he is out of his most familiar environment. In the meantime, she would like for him to have a chopped diet and thin liquids with use of careful hand feeding and full supervision during the times when his mentation better allows for PO intake. She acknowldges that this would be with aspiration risk (although unknown level without  further testing), which we would try to reduce as much as possible without eliminating it. She says that she would like to focus on quality of life over just quantity, but that she would also like for him to have as much length of life as possible while still allowing him to have the things he really enjoys. MD also made aware of her preference, and is in agreement with offering Dys 2 (chopped) diet and thin liquids with careful assistance. SLP will f/u acutely as daughter makes a decision about any further testing.  ADDENDUM:  SLP received a return phone call that [name] would like to proceed with MBS on next date, still keeping chopped diet in place in the interim.    Swallow Evaluation Recommendations  SLP Diet Recommendations Dysphagia 2 (Fine chop);Thin liquid (per daughter's preference, acknowledging risk)  Liquid Administration via Cup;Straw  Medication Administration Crushed with puree  Supervision Staff to assist with self feeding;Full supervision/cueing for compensatory strategies  Compensations Minimize environmental distractions;Slow rate;Small sips/bites  Postural Changes Seated upright at 90 degrees;Remain upright for at least 30 minutes after po intake        10/17/21 1200  SLP Visit Information  SLP Received On 10/17/21  Subjective  Subjective very HOH  Patient/Family Stated Goal \\none  stated  General Information  HPI 87 y/o male admitted on 11/30 due to SOB with COPD exacerbation and concern for PNA. Moved to ICU 12/2 with increased respiratory distress and agitation.  PMHx: CHF, COPD, dementia, anemia, BPH, CAD, DJD, HLD, HTN, and spinal stenosis  Type of Study Bedside Swallow Evaluation  Previous Swallow Assessment none in chart  Diet Prior to this Study Regular;Thin liquids  Temperature Spikes Noted No  Respiratory Status Nasal cannula  History of Recent Intubation No  Behavior/Cognition Alert;Doesn't follow directions;Other (Comment) (very HOH)  Oral Cavity  Assessment  (limited visibility - not following (hearing?) command, clenching with tactile cues)  Oral Care Completed by SLP No  Oral Cavity - Dentition Adequate natural dentition (as can be observed)  Self-Feeding Abilities Total assist  Patient Positioning Upright in bed  Baseline Vocal Quality Normal  Volitional Cough Cognitively unable to elicit (?hearing)  Volitional Swallow Unable to elicit (?hearing)  Pain Assessment  Pain Assessment Faces  Faces Pain Scale 0  Breathing 1  Negative Vocalization 0  Facial Expression 0  Body Language 0  Consolability 0  PAINAD Score 1  Oral Assessment (Complete on admission/transfer/every shift)  Does patient have any of the following "high(er) risk" factors? None of the above  Does patient have any of the following "at risk" factors? Oxygen therapy - cannula, mask, simple oxygen devices  Patient is AT RISK Order set for Adult Oral Care Protocol initiated -  "At Risk Patients" option selected (see row information)  Oral Motor/Sensory Function  Overall Oral Motor/Sensory Function  (not following commands for direct assessment but appears to be weakened symmetrically)  Ice Chips  Ice chips NT  Thin Liquid  Thin Liquid Impaired  Presentation Cup;Straw  Oral Phase Impairments Poor awareness of bolus  Pharyngeal  Phase Impairments Cough - Delayed  Nectar Thick Liquid  Nectar Thick Liquid NT  Honey Thick Liquid  Honey Thick Liquid NT  Puree  Puree Impaired  Presentation Spoon  Oral Phase Impairments Poor awareness of bolus  Oral Phase Functional Implications Oral residue  Pharyngeal Phase Impairments Cough - Delayed  Solid  Solid Impaired  Presentation Self Fed  Oral Phase Impairments Impaired mastication  Oral Phase Functional Implications Oral residue  Pharyngeal Phase Impairments Cough - Delayed  SLP - End of Session  Patient left in bed  Nurse Communication Diet recommendation;Treatment plan  SLP Assessment  Clinical  Impression Statement (ACUTE ONLY) Pt presents with a baseline cough that makes clinical assessment of swallowing more limited. Coughing persists throughout PO intake, typically with a brief delay after a swallow is felt to palpation. He is deconditioned and does not engage in self-feeding, with reduced awareness for bolus acceptance via spoon and straw. Once boluses enter his mouth he starts to manipulate and swallow them a little more automatically, but he does still have moderate oral residue with purees and regular solids. Given signs of dysphagia, current mentation (likely also influenced by his hearing), and acute deconditioning, he could be at increased risk for aspiration at this time. SLP spoke with pt's daughter via phone for an extensive conversation (~60 minutes) regarding swallowing. She says he self-feeds at baseline and consumes soft solids/thin liquids without any overt signs of difficulty. Although he has had PNA in the past, it has been several years. She has no concerns about his baseline level of swallowing, and shares that there have been times during this hospital stay that he has also appeared to do well. Education was provided about the potential for fluctuating status that would also bring fluctuating risk. MBS was discussed as a means of getting a closer look at oropharyngeal swallowing and aspiration risk, and she would like to consider if she wants to do this test here or on an outpatient basis, as one of her main priorities is to  get him back to his facility as soon as possible as he tends to decline more the longer he is out of his most familiar environment. In the meantime, she would like for him to have a chopped diet and thin liquids with use of careful hand feeding and full supervision during the times when his mentation better allows for PO intake. She acknowldges that this would be with aspiration risk (although unknown level without further testing), which we would try to reduce  as much as possible without eliminating it. She says that she would like to focus on quality of life over just quantity, but that she would also like for him to have as much length of life as possible while still allowing him to have the things he really enjoys. MD also made aware of her preference, and is in agreement with offering Dys 2 (chopped) diet and thin liquids with careful assistance. SLP will f/u acutely as daughter makes a decision about any further testing.  SLP Visit Diagnosis Dysphagia, unspecified (R13.10)  Impact on safety and function Moderate aspiration risk;Risk for inadequate nutrition/hydration  Other Related Risk Factors History of pneumonia;Cognitive impairment;Deconditioning;Decreased respiratory status  Swallow Evaluation Recommendations  SLP Diet Recommendations Dysphagia 2 (Fine chop);Thin liquid (per daughter's preference, acknowledging risk)  Liquid Administration via Cup;Straw  Medication Administration Crushed with puree  Supervision Staff to assist with self feeding;Full supervision/cueing for compensatory strategies  Compensations Minimize environmental distractions;Slow rate;Small sips/bites  Postural Changes Seated upright at 90 degrees;Remain upright for at least 30 minutes after po intake  Treatment Plan  Oral Care Recommendations Oral care QID  Other Recommendations Have oral suction available  Treatment Recommendations Therapy as outlined in treatment plan below  Follow Up Recommendations Home health SLP (if ALF can provide level of assistance needed, this would be daughter's first choice)  Functional Status Assessment Patient has had a recent decline in their functional status and/or demonstrates limited ability to make significant improvements in function in a reasonable and predictable amount of time  Speech Therapy Frequency (ACUTE ONLY) min 2x/week  Treatment Duration 2 weeks  Interventions Aspiration precaution training;Compensatory  techniques;Patient/family education;Trials of upgraded texture/liquids;Diet toleration management by SLP  Prognosis  Prognosis for Safe Diet Advancement Fair  Barriers to Reach Goals Cognitive deficits  Individuals Consulted  Consulted and Agree with Results and Recommendations Patient;Family member/caregiver;MD;RN  Family Member Consulted daughter, [name], by phone  SLP Time Calculation  SLP Start Time (ACUTE ONLY) 1227  SLP Stop Time (ACUTE ONLY) 1247  SLP Time Calculation (min) (ACUTE ONLY) 20 min  SLP Evaluations  $ SLP Speech Visit 1 Visit  SLP Evaluations  $BSS Swallow 1 Procedure    Mahala Menghini., M.A. CCC-SLP Acute Herbalist [phone] Office [phone]

## 2021-10-17 NOTE — Evaluation (Signed)
Occupational Therapy Evaluation Patient Details Name: Andrew Frey MRN: [id number] DOB: 14-Jan-1922 Today's Date: 10/17/2021   History of Present Illness 87 y/o male admitted on 11/30 due to SOB with COPD exacerbation. Moved to ICU 12/2 with increased respiratory distress and agitation.  PMHx: CHF, COPD, dementia, anemia, BPH, CAD, DJD, HLD, HTN, and spinal stenosis   Clinical Impression   Patient admitted for above and limited by problem list below, including impaired cognition, vision, weakness, balance and activity tolerance.  Pt is very HOH, with hearing aides in, and follows very minimal commands--unsure if this is HOH or cognitive as pt demonstrates difficulty following gestures or even copying therapist.  Verbalizes "yes ma'am" throughout session.  Pt unable to provide PLOF and no family present.  He is oriented to self only.  He requires min assist for transfers, but up to total assist for ADLs.  On 2L [place] during session with desaturation to 84% with few steps to transport chair, but recovered after increased to 4L (on 2L upon exit), HR up to 140s with minimal activity in room. He will benefit from continued OT services acutely and after dc at SNF level to optimize independence and decrease burden of care.  Will follow.      Recommendations for follow up therapy are one component of a multi-disciplinary discharge planning process, led by the attending physician.  Recommendations may be updated based on patient status, additional functional criteria and insurance authorization.   Follow Up Recommendations  Skilled nursing-short term rehab (<3 hours/day)    Assistance Recommended at Discharge Frequent or constant Supervision/Assistance  Functional Status Assessment  Patient has had a recent decline in their functional status and demonstrates the ability to make significant improvements in function in a reasonable and predictable amount of time.  Equipment Recommendations  BSC/3in1     Recommendations for Other Services       Precautions / Restrictions Precautions Precautions: Fall;Other (comment) Precaution Comments: watch sats and HR Restrictions Weight Bearing Restrictions: No      Mobility Bed Mobility Overal bed mobility: Needs Assistance Bed Mobility: Supine to Sit     Supine to sit: Min assist;HOB elevated     General bed mobility comments: OOB upon entry in recliner    Transfers Overall transfer level: Needs assistance   Transfers: Sit to/from Stand Sit to Stand: Min assist           General transfer comment: cueing for hand placement and safety, assist boost up and steady      Balance Overall balance assessment: Needs assistance Sitting-balance support: No upper extremity supported;Feet supported Sitting balance-Leahy Scale: Fair Sitting balance - Comments: EOb with guarding for safety   Standing balance support: Bilateral upper extremity supported;During functional activity Standing balance-Leahy Scale: Poor Standing balance comment: bil UE support on RW                           ADL either performed or assessed with clinical judgement   ADL Overall ADL's : Needs assistance/impaired     Grooming: Sitting;Maximal assistance           Upper Body Dressing : Total assistance;Sitting   Lower Body Dressing: Total assistance;Sit to/from stand   Toilet Transfer: Minimal assistance;Ambulation;Rolling walker (2 wheels) Toilet Transfer Details (indicate cue type and reason): simulated to transport chair         Functional mobility during ADLs: Minimal assistance;Rolling walker (2 wheels);Cueing for safety General ADL Comments: limited by  cognition and HOH, unclear if pt is able to understand what I am asking him to participate in     Vision Patient Visual Report:  (pt unable to report) Additional Comments: pt with spilled breakfast on floor and lap upon entry, requires hand over hand support to place hand on  walker and manage RW around obstacles.  Continue assessment.     Perception     Praxis      Pertinent Vitals/Pain Pain Assessment: Faces Faces Pain Scale: No hurt Breathing: occasional labored breathing, short period of hyperventilation Negative Vocalization: none Facial Expression: smiling or inexpressive Body Language: relaxed Consolability: no need to console PAINAD Score: 1     Hand Dominance     Extremity/Trunk Assessment Upper Extremity Assessment Upper Extremity Assessment: Generalized weakness   Lower Extremity Assessment Lower Extremity Assessment: Defer to PT evaluation   Cervical / Trunk Assessment Cervical / Trunk Assessment: Kyphotic   Communication Communication Communication: HOH;Other (comment)   Cognition Arousal/Alertness: Awake/alert Behavior During Therapy: Flat affect Overall Cognitive Status: No family/caregiver present to determine baseline cognitive functioning                                 General Comments: pt extremely HOH with hearing aides in, voicing "yes ma'am" consistently thorughout session but not much else.  pt requires hand over hand to initate tasks, with poor awareness to safety     General Comments  pt on 2L, desaturated to 84% with steps to chair but recovred well after increased to 4L . Dropped back down to 2L at end of session and maintained SpO2>90%.  HR up to 140 with minimal activity    Exercises     Shoulder Instructions      Home Living Family/patient expects to be discharged to:: Assisted living                             Home Equipment: Rolling Walker (2 wheels)          Prior Functioning/Environment Prior Level of Function : Needs assist             Mobility Comments: walks to dining hall with walker per daughter report to RN ADLs Comments: unclear- pt unable to report but antiipcate some assist required        OT Problem List: Decreased strength;Decreased activity  tolerance;Impaired balance (sitting and/or standing);Impaired vision/perception;Decreased coordination;Decreased cognition;Decreased safety awareness;Decreased knowledge of use of DME or AE;Decreased knowledge of precautions;Cardiopulmonary status limiting activity      OT Treatment/Interventions: Self-care/ADL training;Therapeutic exercise;DME and/or AE instruction;Therapeutic activities;Cognitive remediation/compensation;Patient/family education;Balance training;Visual/perceptual remediation/compensation    OT Goals(Current goals can be found in the care plan section) Acute Rehab OT Goals Patient Stated Goal: none stated OT Goal Formulation: Patient unable to participate in goal setting Time For Goal Achievement: 10/31/21 Potential to Achieve Goals: Good  OT Frequency: Min 2X/week   Barriers to D/C:            Co-evaluation              AM-PAC OT "6 Clicks" Daily Activity     Outcome Measure Help from another person eating meals?: A Lot Help from another person taking care of personal grooming?: A Lot Help from another person toileting, which includes using toliet, bedpan, or urinal?: A Lot Help from another person bathing (including washing, rinsing, drying)?: A Lot Help from  another person to put on and taking off regular upper body clothing?: A Lot Help from another person to put on and taking off regular lower body clothing?: A Lot 6 Click Score: 12   End of Session Equipment Utilized During Treatment: Gait belt;Rolling walker (2 wheels);Oxygen Nurse Communication: Mobility status  Activity Tolerance: Patient tolerated treatment well Patient left: in chair;with nursing/sitter in room  OT Visit Diagnosis: Other abnormalities of gait and mobility (R26.89);Muscle weakness (generalized) (M62.81);Other symptoms and signs involving cognitive function                Time: 1125-1149 OT Time Calculation (min): 24 min Charges:  OT General Charges $OT Visit: 1 Visit OT  Evaluation $OT Eval Moderate Complexity: 1 Mod OT Treatments $Self Care/Home Management : 8-22 mins  Barry Brunner, OT Acute Rehabilitation Services Pager [phone] Office [phone]   Chancy Milroy 10/17/2021, 1:57 PM

## 2021-10-18 ENCOUNTER — Inpatient Hospital Stay (HOSPITAL_COMMUNITY): Payer: Medicare Other

## 2021-10-18 LAB — BASIC METABOLIC PANEL
Anion gap: 10 (ref 5–15)
BUN: 29 mg/dL — ABNORMAL HIGH (ref 8–23)
CO2: 24 mmol/L (ref 22–32)
Calcium: 10 mg/dL (ref 8.9–10.3)
Chloride: 108 mmol/L (ref 98–111)
Creatinine, Ser: 1.34 mg/dL — ABNORMAL HIGH (ref 0.61–1.24)
GFR, Estimated: 48 mL/min — ABNORMAL LOW (ref >60)
Glucose, Bld: 114 mg/dL — ABNORMAL HIGH (ref 70–99)
Potassium: 3.4 mmol/L — ABNORMAL LOW (ref 3.5–5.1)
Sodium: 142 mmol/L (ref 135–145)

## 2021-10-18 LAB — SARS CORONAVIRUS 2 (TAT 6-24 HRS): SARS Coronavirus 2: NEGATIVE

## 2021-10-18 MED ORDER — ARFORMOTEROL TARTRATE 15 MCG/2ML IN NEBU: 15.0000 ug | INHALATION_SOLUTION | Freq: Two times a day (BID) | RESPIRATORY_TRACT | 0 refills | Status: AC

## 2021-10-18 MED ORDER — DILTIAZEM HCL ER COATED BEADS 120 MG PO CP24
120.0000 mg | ORAL_CAPSULE | Freq: Every day | ORAL | 0 refills | Status: AC
Start: 2021-10-19 — End: 2021-11-18

## 2021-10-18 MED ORDER — LORAZEPAM 2 MG/ML IJ SOLN
0.2500 mg | Freq: Every evening | INTRAMUSCULAR | Status: DC | PRN
  Administered 2021-10-18: 0.25 mg via INTRAVENOUS
  Filled 2021-10-18: qty 1

## 2021-10-18 MED ORDER — ARIPIPRAZOLE 5 MG PO TABS
7.0000 mg | ORAL_TABLET | Freq: Every day | ORAL | Status: DC
  Administered 2021-10-18: 7 mg via ORAL
  Filled 2021-10-18 (×2): qty 1

## 2021-10-18 MED ORDER — GUAIFENESIN-DM 100-10 MG/5ML PO SYRP: 5.0000 mL | ORAL_SOLUTION | ORAL | 0 refills | Status: AC | PRN

## 2021-10-18 NOTE — Progress Notes (Signed)
SATURATION QUALIFICATIONS: (This note is used to comply with regulatory documentation for home oxygen)  Patient Saturations on Room Air at Rest = 90%  Patient Saturations on Room Air while Ambulating = NA  Patient Saturations on NA Liters of oxygen while Ambulating = NA%  Please briefly explain why patient needs home oxygen:  Unable to perform walking O2 test due to pt's mentation; it would be unsafe. Pt's Spo2 desats in the mid-80s with mild exertion.

## 2021-10-18 NOTE — Plan of Care (Signed)
  Problem: Clinical Measurements: Goal: Ability to maintain clinical measurements within normal limits will improve Outcome: Progressing Goal: Will remain free from infection Outcome: Progressing Goal: Diagnostic test results will improve Outcome: Progressing Goal: Respiratory complications will improve Outcome: Progressing Goal: Cardiovascular complication will be avoided Outcome: Progressing   Problem: Nutrition: Goal: Adequate nutrition will be maintained Outcome: Progressing   Problem: Safety: Goal: Ability to remain free from injury will improve Outcome: Progressing   

## 2021-10-18 NOTE — Plan of Care (Signed)

## 2021-10-18 NOTE — Progress Notes (Signed)
Modified Barium Swallow Progress Note  Patient Details  Name: Andrew Frey MRN: [id number] Date of Birth: [birth year]  Today's Date: 10/18/2021  Modified Barium Swallow completed.  Full report located under Chart Review in the Imaging Section.  Brief recommendations include the following:  Clinical Impression  Pt has an oropharyngeal dysphagia that is likely impacted by current mentation and overall deconditioning, which per ddaughter's report has fluctuated a lot while he has been in the hospital. He has incomplete labial seal with passive acceptance of boluses with significant amounts of anterior loss especially with thin liquids. It is very challenging to get thin liquids to stay in his mouth, but he also does not suck through a straw despite repeated attempts across the study. He also did not make any attempts to masticate a cracker, so this was not left in his mouth. Oral transit is slow but he does initiate it consistently, leaving lingual residue behind. A combination of reduced base of tongue retraciton, incomplete epiglottic inversion, and inconsistent timing result in at least penetration into the laryngeal vestibule fairly consistently with thin and nectar thick liquids. Pt also could not maintain an upright position throughout the study despite assistance provided from staff, so it was visibility of the vocal folds was limited. Therefore, although penetration can be confirmed, aspiration cannot be excluded. He at least did not clear penetration, putting him at risk for aspiration across a meal, and if he did aspirate during this study it would have been silent as no coughing was noted. Heavier boluses, such as honey thick liquids and purees, leave mild residue in the valleculae but also provide more weight to invert the epiglottis, and as a result he has improved airway protection. Per conversation with daugther on previous date, she would be agreeable to diet modifications to try to reduce  aspiration risk. Given his fluctuating performance, recommend a more conservative diet of Dys 1 (puree) foods and honey thick liquids. SLP will f/u for ongoing tx acutely including education with family with f/u indicated at next level of care as well.   Swallow Evaluation Recommendations       SLP Diet Recommendations: Dysphagia 1 (Puree) solids;Honey thick liquids   Liquid Administration via: Cup;Spoon   Medication Administration: Crushed with puree   Supervision: Full assist for feeding;Full supervision/cueing for compensatory strategies   Compensations: Minimize environmental distractions;Slow rate;Small sips/bites   Postural Changes: Seated upright at 90 degrees;Remain semi-upright after after feeds/meals (Comment)   Oral Care Recommendations: Oral care BID   Other Recommendations: Order thickener from pharmacy;Prohibited food (jello, ice cream, thin soups);Remove water pitcher;Have oral suction available    Mahala Menghini., M.A. CCC-SLP Acute Rehabilitation Services Pager [phone] Office [phone]  10/18/2021,9:34 AM

## 2021-10-18 NOTE — Care Management Important Message (Signed)
Important Message  Patient Details  Name: Andrew Frey MRN: [id number] Date of Birth: 05-31-1922   Medicare Important Message Given:  Yes     Dorena Bodo 10/18/2021, 2:05 PM

## 2021-10-18 NOTE — TOC Transition Note (Signed)
Transition of Care [facility]) - CM/SW Discharge Note   Patient Details  Name: Andrew Frey MRN: [id number] Date of Birth: 1922-04-29  Transition of Care [facility]) CM/SW Contact:  Lawerance Sabal, RN Phone Number: 10/18/2021, 1:57 PM   Clinical Narrative:   Spoke with patient's daughter re DC plan. She states that she has been to the hospital and has spent quite a bit of time with the patient and is familiar with his functional level and support needs. She feels he will improve in a familiar setting. At [facility] his room next to the nurses station ,the family lives about 10 minutes away and they visit frequently.  Spoke w [facility] and told them patient will DC back in the next 1-2 days. MD has now written DC order and CSW will follow up with facility and family for acceptance ability and transportation.  [facility] confirmed that he has an oxygen concentrator and that he can have suction. Patient and family will be responsible for using suction as needed. Family understands that staff will not be at the bedside like hospital staff is to suction. Suction to be delivered to facility by [facility] today. [facility] state that they contract with [facility] and I have requested orders for [facility] PT OT ST from MD and notified [facility] of pending DC.     Final next level of care: Assisted Living Barriers to Discharge: Continued Medical Work up   Patient Goals and CMS Choice Patient states their goals for this hospitalization and ongoing recovery are:: to return to ALF CMS Medicare.gov Compare Post Acute Care list provided to:: Other (Comment Required) Choice offered to / list presented to : Adult Children  Discharge Placement                       Discharge Plan and Services                DME Arranged: Suction DME Agency: [facility] Date DME Agency Contacted: 10/18/21 Time DME Agency Contacted: [id number] Representative spoke with at DME Agency: Velna Hatchet HH Arranged: PT, OT, Speech Therapy HH Agency:  [facility] Date [facility] Agency Contacted: 10/18/21 Time HH Agency Contacted: 1357 Representative spoke with at [facility] Agency: Amy  Social Determinants of Health (SDOH) Interventions     Readmission Risk Interventions No flowsheet data found.

## 2021-10-18 NOTE — Discharge Summary (Addendum)
Physician Discharge Summary  Andrew Frey [id number] DOB: January 05, 1922 DOA: 10/11/2021  PCP: Ernestene Kiel, MD  Admit date: 10/11/2021 Discharge date: 10/19/2021  Admitted From: [facility] Disposition:ALF with PTOT  Recommendations for Outpatient Follow-up:  Follow up with PCP in 1-2 weeks Please obtain BMP/CBC in one week  Home Health:yes  Equipment/Devices:No  Discharge Condition: Stable Code Status:   Code Status: Full Code Diet recommendation:  Diet Order             DIET - DYS 1 Room service appropriate? No; Fluid consistency: Honey Thick  Diet effective now                 Brief/Interim Summary: Andrew Frey, 87 y.o. male with PMH of chronic diastolic CHF, dementia, questionable history of COPD most recent PFT in June 2015 FEV1/FVC of 0.8 on 2 L nasal cannula as needed,  BPH presented with progressive shortness of breath over the last 1 to 2 days, without chest pain nausea vomiting but having cough, no dysuria. In the ED febrile one 101.5 for tachycardia tachypnea saturating 87-95% on 2 L.  Patient was exhibiting significant mount proving placed on nonrebreather, labs with creatinine 1.9 no previous study available but was 1.1 in 2015, BNP 154, leukocytosis 13.4 initial lactic acidosis 2.2 COVID-19 negative blood cultures sent, chest x-ray with right perihilar airspace opacity concerning for pneumonia differential includes edema versus atelectasis.Patient given fluids antibiotics and admitted for severe sepsis and hypoxia Patient had worsening respiratory status transferred to ICU 12/2 night seen by pulmonary critical care remain full code managed with high flow nasal cannula.  He was also having confusion and agitation-needing Ativan during the night.  He was managed for oxygen weaning mental status hypoxia deconditioning. Prolonged hospitalization due to his mental status, difficulty with secretion at times. Speech evaluation completed and had MBS and felt to be  appropriate for Pureed and thickened liquid. Daughter has been requesting for discharge back to the facility for couple of days now.  We discussed the disposition and once he is more awake we will plan to discharge him back to the facility. Came and examined him. He is more alert awake now " He says can you get me out of here today. I spoke with the daughter again in afternoon and she would like him to be back. Patient could not be discharged 12/7 due to facility not having oxygen. Seen this morning is alert awake, did eat decent amount of food per nursing staff. Is very hard of hearing. Is stable for discharge back to the facility this morning  Discharge Diagnoses:   Severe sepsis POA Right-sided pneumonia- aspiration pneumonia Acute hypoxic respiratory failure due to above: moved to ICU 12/2 night on HFNC.Blood cs staph capitis1/2  suspect contamination repeat blood culture  1/3- ngtd.  Having issues with clearing of secretion-being managed with aggressive pulmonary toileting with chest physiotherapy, nebulizer, Brovana, Pulmicort-supplemental oxygen. Currently doing well on 2 L, somewhat sleepy this morning did not sleep well last night.  Continue supportive care.  He has completed antibiotic course.  Afebrile and no leukocytosis. Recent Labs  Lab 10/12/21 0128 10/12/21 0542 10/12/21 1816 10/13/21 0237 10/14/21 0608 10/15/21 0646 10/16/21 0123 10/17/21 0144  WBC 13.4* 13.4*  --  11.9* 10.9* 10.6* 10.0 10.1  LATICACIDVEN 2.0* 2.7* 2.0*  --   --   --   --   --   PROCALCITON  --  <0.10  --   --   --   --   --   --  Acute hypoxic respiratory failure due to pneumonia and sepsis. Also mouth breather: At times doing well on room air but needing 2 L nasal cannula.  He will need 2 L of cannula upon discharge.   Possible JXB:[id number]:completed antibiotics  A. fib with RVR: Appears new onset, seen by cardiology appreciate input high risk for anticoagulation but needs to followed by his PCP or  primary cardiology, placed on oral Cardizem for rate control.  Has normal TSH, stable echocardiogram.  Continues aspirin Plavix  AKI w/Baseline creatinine 1.28: 05/17/2016 indicating CKD stage IIIa Metabolic acidosis  Possible progression of his CKD  vs AKI on admission, AKI has improved 1.3.  Encourage oral intake  Recent Labs  Lab 10/14/21 0608 10/15/21 0646 10/16/21 0123 10/17/21 0144 10/18/21 0041  BUN 26* 21 23 21  29*  CREATININE 1.55* 1.31* 1.27* 1.25* 1.34*     Chronic diastolic CHF: stable BNP 154.  Appears overall euvolemic. Net IO Since Admission: -4,256.3 mL [10/18/21 1249]   Anemia of critical illness/thrombocytopenia -counts stable  Recent Labs  Lab 10/15/21 0646 10/16/21 0123 10/17/21 0144  HGB 10.7* 10.6* 11.4*  HCT 34.3* 32.9* 36.0*  WBC 10.6* 10.0 10.1  PLT 162 161 205      BPH: on Flomax  Goals of care Advanced age/Dementia at baseline Very hard of hearing Acute metabolic encephalopathy:  Worsening confusion in the setting of acute respiratory illness hypoxia, advanced age, hospitalization, unfamiliar environment, deconditioning.  Has been needing Ativan did not sleep well last night.  Ativan changed to bedtime as needed, he will do much better if we can get him back to his facility soon.  If patient patient more alert awake we will discharge him back to the facility, discussed with the daughter who is requesting discharge as soon as possible Given his overall frailty at risk of decompensation readmission.  Seen by palliative care and daughter at this time continue with aggressive measures.   continue home Abilify, Zoloft.  Encourage PT OT Patient reevaluated in the afternoon is much more alert awake able to eat some pudding from nursing staff. Spoke with patient's daughter in detail she would like him to be back to the facility as she feels he will probably do better there in terms of his mentation.  He is hemodynamically stable for discharge. I did discuss  with daughter that given his overloads fidelity risk of aspiration he remains at risk for developing pneumonia respiratory issues will need close monitoring follow-up with his doctor  Consults: PCCM PMT  Subjective: Alert awake very hard of hearing,  Discharge Exam: Vitals:   10/19/21 0814 10/19/21 1000  BP: 128/73   Pulse: (!) 102   Resp: 19   Temp: 97.9 F (36.6 C)   SpO2: 95% 95%   General: Pt is alert, awake, not in acute distress Cardiovascular: RRR, S1/S2 +, no rubs, no gallops Respiratory: CTA bilaterally, no wheezing, no rhonchi Abdominal: Soft, NT, ND, bowel sounds + Extremities: no edema, no cyanosis  Discharge Instructions  Discharge Instructions     Discharge instructions   Complete by: As directed    CBC BMP chest x-ray in 1 week from primary care doctor Discussed with your primary care doctor or cardiology regarding your new onset atrial fibrillation about anticoagulation  Continue aspiration precautions and modified diet with thickened liquid as instructed  Please call call MD or return to ER for similar or worsening recurring problem that brought you to hospital or if any fever,nausea/vomiting,abdominal pain, uncontrolled pain, chest pain,  shortness  of breath or any other alarming symptoms.  Please follow-up your doctor as instructed in a week time and call the office for appointment.  Please avoid alcohol, smoking, or any other illicit substance and maintain healthy habits including taking your regular medications as prescribed.  You were cared for by a hospitalist during your hospital stay. If you have any questions about your discharge medications or the care you received while you were in the hospital after you are discharged, you can call the unit and ask to speak with the hospitalist on call if the hospitalist that took care of you is not available.  Once you are discharged, your primary care physician will handle any further medical issues. Please  note that NO REFILLS for any discharge medications will be authorized once you are discharged, as it is imperative that you return to your primary care physician (or establish a relationship with a primary care physician if you do not have one) for your aftercare needs so that they can reassess your need for medications and monitor your lab values      Allergies as of 10/19/2021   No Known Allergies      Medication List     TAKE these medications    acetaminophen 325 MG tablet Commonly known as: TYLENOL Take 325 mg by mouth 3 (three) times daily as needed for mild pain.   albuterol (2.5 MG/3ML) 0.083% nebulizer solution Commonly known as: PROVENTIL Take 2.5 mg by nebulization 3 (three) times daily.   albuterol 108 (90 Base) MCG/ACT inhaler Commonly known as: VENTOLIN HFA Inhale 2 puffs into the lungs every 4 (four) hours as needed for wheezing or shortness of breath.   amoxicillin 500 MG capsule Commonly known as: AMOXIL Take 2,000 mg by mouth See admin instructions. 1 hour prior to dental appointment   anti-nausea solution Take 15 mLs by mouth as needed for nausea.   arformoterol 15 MCG/2ML Nebu Commonly known as: BROVANA Take 2 mLs (15 mcg total) by nebulization 2 (two) times daily for 14 days.   ARIPiprazole 5 MG tablet Commonly known as: ABILIFY Take 5 mg by mouth at bedtime.   ARTIFICIAL TEAR OP Place 2 drops into both eyes in the morning and at bedtime.   Asmanex (30 Metered Doses) 220 MCG/ACT inhaler Generic drug: mometasone Inhale 2 puffs into the lungs every evening.   aspirin 81 MG chewable tablet Take 81 mg by mouth daily.   Biofreeze 4 % Gel Generic drug: Menthol (Topical Analgesic) Apply 1 application topically 2 (two) times daily as needed (shoulder pain).   clopidogrel 75 MG tablet Commonly known as: PLAVIX Take 75 mg by mouth daily with breakfast.   dextromethorphan 30 MG/5ML liquid Commonly known as: DELSYM Take 10 mLs by mouth 2 (two)  times daily as needed for cough.   diltiazem 120 MG 24 hr capsule Commonly known as: CARDIZEM CD Take 1 capsule (120 mg total) by mouth daily.   guaiFENesin-dextromethorphan 100-10 MG/5ML syrup Commonly known as: ROBITUSSIN DM Take 5 mLs by mouth every 4 (four) hours as needed for up to 7 days for cough.   lidocaine 5 % Commonly known as: LIDODERM Place 1 patch onto the skin daily. Remove & Discard patch within 12 hours or as directed by MD   loperamide 2 MG tablet Commonly known as: IMODIUM A-D Take 2-4 mg by mouth See admin instructions. 4 mg after first loose stool, then 2 mg after each loose stool ** max 4 tabs in 24 hours **  meloxicam 7.5 MG tablet Commonly known as: MOBIC Take 2 tablets by mouth daily.   OVER THE COUNTER MEDICATION Apply 1 application topically See admin instructions. CBD pain stick apply to area of pain  on shoulder 3 times daily as needed for pain   OVER THE COUNTER MEDICATION Take 2 tablets by mouth in the morning and at bedtime. Juice Plus Garden Chew Gummies   OVER THE COUNTER MEDICATION Take 2 tablets by mouth in the morning and at bedtime. Juice Plus Orchard Chews   OVER THE COUNTER MEDICATION Take 2 tablets by mouth in the morning and at bedtime. Juice Plus Vineyard chews   oxyCODONE-acetaminophen 10-325 MG tablet Commonly known as: PERCOCET Take 1 tablet by mouth 4 (four) times daily as needed.   sennosides-docusate sodium 8.6-50 MG tablet Commonly known as: SENOKOT-S Take 1 tablet by mouth in the morning and at bedtime.   sertraline 50 MG tablet Commonly known as: ZOLOFT Take 50 mg by mouth daily.   Systane 0.4-0.3 % Gel ophthalmic gel Generic drug: Polyethyl Glycol-Propyl Glycol Place 1 application into both eyes in the morning and at bedtime.   tamsulosin 0.4 MG Caps capsule Commonly known as: FLOMAX Take 0.4 mg by mouth at bedtime.   VITAMIN-B COMPLEX PO Take 1 tablet by mouth daily.               Durable Medical  Equipment  (From admission, onward)           Start     Ordered   10/18/21 1356  For home use only DME oxygen  Once       Question Answer Comment  Length of Need Lifetime   Mode or (Route) Nasal cannula   Liters per Minute 2   Oxygen delivery system Gas      10/18/21 1355   10/18/21 1352  For home use only DME Suction  Once       Question:  Suction  Answer:  Oral   10/18/21 1351            Follow-up Information     Ernestene Kiel, MD Follow up in 1 week(s).   Specialty: Internal Medicine Contact information: [address]. [place] [address] [phone]                No Known Allergies  The results of significant diagnostics from this hospitalization (including imaging, microbiology, ancillary and laboratory) are listed below for reference.    Microbiology: Recent Results (from the past 240 hour(s))  Resp Panel by RT-PCR (Flu A&B, Covid) Nasopharyngeal Swab     Status: None   Collection Time: 10/11/21 11:11 PM   Specimen: Nasopharyngeal Swab; Nasopharyngeal(NP) swabs in vial transport medium  Result Value Ref Range Status   SARS Coronavirus 2 by RT PCR NEGATIVE NEGATIVE Final    Comment: (NOTE) SARS-CoV-2 target nucleic acids are NOT DETECTED.  The SARS-CoV-2 RNA is generally detectable in upper respiratory specimens during the acute phase of infection. The lowest concentration of SARS-CoV-2 viral copies this assay can detect is 138 copies/mL. A negative result does not preclude SARS-Cov-2 infection and should not be used as the sole basis for treatment or other patient management decisions. A negative result may occur with  improper specimen collection/handling, submission of specimen other than nasopharyngeal swab, presence of viral mutation(s) within the areas targeted by this assay, and inadequate number of viral copies(<138 copies/mL). A negative result must be combined with clinical observations, patient history, and  epidemiological information.  The expected result is Negative.  Fact Sheet for Patients:  EntrepreneurPulse.com.au  Fact Sheet for Healthcare Providers:  IncredibleEmployment.be  This test is no t yet approved or cleared by the [place] FDA and  has been authorized for detection and/or diagnosis of SARS-CoV-2 by FDA under an Emergency Use Authorization (EUA). This EUA will remain  in effect (meaning this test can be used) for the duration of the COVID-19 declaration under Section 564(b)(1) of the Act, 21 U.S.C.section 360bbb-3(b)(1), unless the authorization is terminated  or revoked sooner.       Influenza A by PCR NEGATIVE NEGATIVE Final   Influenza B by PCR NEGATIVE NEGATIVE Final    Comment: (NOTE) The Xpert Xpress SARS-CoV-2/FLU/RSV plus assay is intended as an aid in the diagnosis of influenza from Nasopharyngeal swab specimens and should not be used as a sole basis for treatment. Nasal washings and aspirates are unacceptable for Xpert Xpress SARS-CoV-2/FLU/RSV testing.  Fact Sheet for Patients: EntrepreneurPulse.com.au  Fact Sheet for Healthcare Providers: IncredibleEmployment.be  This test is not yet approved or cleared by the [place] FDA and has been authorized for detection and/or diagnosis of SARS-CoV-2 by FDA under an Emergency Use Authorization (EUA). This EUA will remain in effect (meaning this test can be used) for the duration of the COVID-19 declaration under Section 564(b)(1) of the Act, 21 U.S.C. section 360bbb-3(b)(1), unless the authorization is terminated or revoked.  Performed at [facility], [place] [address] [place], [place] [address]   Blood culture (routine x 2)     Status: Abnormal   Collection Time: 10/12/21  1:23 AM   Specimen: BLOOD  Result Value Ref Range Status   Specimen Description BLOOD RIGHT ANTECUBITAL  Final   Special Requests   Final     BOTTLES DRAWN AEROBIC AND ANAEROBIC Blood Culture adequate volume   Culture  Setup Time   Final    GRAM POSITIVE COCCI IN BOTH AEROBIC AND ANAEROBIC BOTTLES CRITICAL RESULT CALLED TO, READ BACK BY AND VERIFIED WITH: PHARMD GREG ABBOTT 10/13/21@5 :41 BY TW    Culture (A)  Final    STAPHYLOCOCCUS CAPITIS THE SIGNIFICANCE OF ISOLATING THIS ORGANISM FROM A SINGLE VENIPUNCTURE CANNOT BE PREDICTED WITHOUT FURTHER CLINICAL AND CULTURE CORRELATION. SUSCEPTIBILITIES AVAILABLE ONLY ON REQUEST. Performed at [facility], [place] [address] [place], [place] [address]    Report Status 10/15/2021 FINAL  Final  Blood Culture ID Panel (Reflexed)     Status: Abnormal   Collection Time: 10/12/21  1:23 AM  Result Value Ref Range Status   Enterococcus faecalis NOT DETECTED NOT DETECTED Final   Enterococcus Faecium NOT DETECTED NOT DETECTED Final   Listeria monocytogenes NOT DETECTED NOT DETECTED Final   Staphylococcus species DETECTED (A) NOT DETECTED Final    Comment: CRITICAL RESULT CALLED TO, READ BACK BY AND VERIFIED WITH: PHARMD GREG ABBOTT 10/13/21@5 :41 BY TW    Staphylococcus aureus (BCID) NOT DETECTED NOT DETECTED Final   Staphylococcus epidermidis NOT DETECTED NOT DETECTED Final   Staphylococcus lugdunensis NOT DETECTED NOT DETECTED Final   Streptococcus species NOT DETECTED NOT DETECTED Final   Streptococcus agalactiae NOT DETECTED NOT DETECTED Final   Streptococcus pneumoniae NOT DETECTED NOT DETECTED Final   Streptococcus pyogenes NOT DETECTED NOT DETECTED Final   A.calcoaceticus-baumannii NOT DETECTED NOT DETECTED Final   Bacteroides fragilis NOT DETECTED NOT DETECTED Final   Enterobacterales NOT DETECTED NOT DETECTED Final   Enterobacter cloacae complex NOT DETECTED NOT DETECTED Final   Escherichia coli NOT DETECTED NOT  DETECTED Final   Klebsiella aerogenes NOT DETECTED NOT DETECTED Final   Klebsiella oxytoca NOT DETECTED NOT DETECTED Final   Klebsiella pneumoniae NOT DETECTED NOT  DETECTED Final   Proteus species NOT DETECTED NOT DETECTED Final   Salmonella species NOT DETECTED NOT DETECTED Final   Serratia marcescens NOT DETECTED NOT DETECTED Final   Haemophilus influenzae NOT DETECTED NOT DETECTED Final   Neisseria meningitidis NOT DETECTED NOT DETECTED Final   Pseudomonas aeruginosa NOT DETECTED NOT DETECTED Final   Stenotrophomonas maltophilia NOT DETECTED NOT DETECTED Final   Candida albicans NOT DETECTED NOT DETECTED Final   Candida auris NOT DETECTED NOT DETECTED Final   Candida glabrata NOT DETECTED NOT DETECTED Final   Candida krusei NOT DETECTED NOT DETECTED Final   Candida parapsilosis NOT DETECTED NOT DETECTED Final   Candida tropicalis NOT DETECTED NOT DETECTED Final   Cryptococcus neoformans/gattii NOT DETECTED NOT DETECTED Final    Comment: Performed at [facility], [place] [address], [place], [place] [address]  MRSA Next Gen by PCR, Nasal     Status: Abnormal   Collection Time: 10/13/21  6:12 PM   Specimen: Nasal Mucosa; Nasal Swab  Result Value Ref Range Status   MRSA by PCR Next Gen DETECTED (A) NOT DETECTED Final    Comment: CRITICAL RESULT CALLED TO, READ BACK BY AND VERIFIED WITH: MONICA CROSS RN 10/13/2021 @2100  BY JW (NOTE) The GeneXpert MRSA Assay (FDA approved for NASAL specimens only), is one component of a comprehensive MRSA colonization surveillance program. It is not intended to diagnose MRSA infection nor to guide or monitor treatment for MRSA infections. Test performance is not FDA approved in patients less than 8 years old. Performed at [facility], [place] [address], [place], [place] [address]   Culture, blood (Routine X 2) w Reflex to ID Panel     Status: None (Preliminary result)   Collection Time: 10/14/21 10:50 AM   Specimen: BLOOD  Result Value Ref Range Status   Specimen Description BLOOD LEFT ANTECUBITAL  Final   Special Requests   Final    BOTTLES DRAWN AEROBIC AND ANAEROBIC Blood Culture adequate volume    Culture   Final    NO GROWTH 4 DAYS Performed at [facility], [place] [address], [place], [place] [address]    Report Status PENDING  Incomplete  Culture, blood (Routine X 2) w Reflex to ID Panel     Status: None (Preliminary result)   Collection Time: 10/14/21 11:06 AM   Specimen: BLOOD LEFT HAND  Result Value Ref Range Status   Specimen Description BLOOD LEFT HAND  Final   Special Requests   Final    BOTTLES DRAWN AEROBIC AND ANAEROBIC Blood Culture adequate volume   Culture   Final    NO GROWTH 4 DAYS Performed at [facility], [place] [address], [place], [place] [address]    Report Status PENDING  Incomplete  SARS CORONAVIRUS 2 (TAT 6-24 HRS) Nasopharyngeal Nasopharyngeal Swab     Status: None   Collection Time: 10/17/21  4:15 PM   Specimen: Nasopharyngeal Swab  Result Value Ref Range Status   SARS Coronavirus 2 NEGATIVE NEGATIVE Final    Comment: (NOTE) SARS-CoV-2 target nucleic acids are NOT DETECTED.  The SARS-CoV-2 RNA is generally detectable in upper and lower respiratory specimens during the acute phase of infection. Negative results do not preclude SARS-CoV-2 infection, do not rule out co-infections with other pathogens, and should not be used as the  sole basis for treatment or other patient management decisions. Negative results must be combined with clinical observations, patient history, and epidemiological information. The expected result is Negative.  Fact Sheet for Patients: HairSlick.no  Fact Sheet for Healthcare Providers: quierodirigir.com  This test is not yet approved or cleared by the [place] FDA and  has been authorized for detection and/or diagnosis of SARS-CoV-2 by FDA under an Emergency Use Authorization (EUA). This EUA will remain  in effect (meaning this test can be used) for the duration of the COVID-19 declaration under Se ction 564(b)(1) of the Act, 21 U.S.C. section  360bbb-3(b)(1), unless the authorization is terminated or revoked sooner.  Performed at [facility], [address], [place], [place] [address]     Procedures/Studies: DG Chest 2 View  Result Date: 10/12/2021 CLINICAL DATA:  Shortness of breath. EXAM: CHEST - 2 VIEW COMPARISON:  Chest radiograph dated 02/20/2007. FINDINGS: Evaluation is limited due to patient's positioning. Right perihilar densities may represent edema, atelectasis, or pneumonia. There are bibasilar atelectasis. No large pleural effusion or pneumothorax. Stable cardiac silhouette. Moderate size hiatal hernia. Osteopenia with degenerative changes of the spine. Multilevel chronic compression fractures. No acute osseous pathology. IMPRESSION: Right perihilar densities may represent edema, atelectasis, or pneumonia. Electronically Signed   By: Elgie Collard M.D.   On: 10/12/2021 00:00   DG Chest Port 1 View  Result Date: 10/14/2021 CLINICAL DATA:  Respiratory failure, shortness of breath. EXAM: PORTABLE CHEST 1 VIEW COMPARISON:  Chest x-rays dated 10/13/2021 and 10/11/2021. FINDINGS: Stable cardiomegaly. Stable appearance of the patchy bilateral airspace opacities and interstitial prominence. Stable appearance of the probable bibasilar atelectasis and/or small pleural effusions. IMPRESSION: Stable chest x-ray. Persistent patchy bilateral airspace opacities and interstitial prominence suggesting multifocal pneumonia versus pulmonary edema. Probable bibasilar atelectasis and/or small pleural effusions. Electronically Signed   By: Bary Richard M.D.   On: 10/14/2021 08:00   DG Chest Port 1 View  Result Date: 10/13/2021 CLINICAL DATA:  Shortness of breath and cough, history coronary artery disease post MI, hypertension, severe sepsis EXAM: PORTABLE CHEST 1 VIEW COMPARISON:  Portable exam 1653 hours compared to 10/11/2021 FINDINGS: Enlargement of cardiac silhouette with vascular congestion. Tortuous thoracic aorta. BILATERAL  patchy pulmonary infiltrates favoring multifocal pneumonia. Small LEFT pleural effusion. No pneumothorax. Bones demineralized. IMPRESSION: Enlargement of cardiac silhouette with vascular congestion. Patchy pulmonary infiltrates favoring multifocal pneumonia. Electronically Signed   By: Ulyses Southward M.D.   On: 10/13/2021 17:16   DG Swallowing Func-Speech Pathology  Result Date: 10/18/2021 Table formatting from the original result was not included. Objective Swallowing Evaluation: Type of Study: MBS-Modified Barium Swallow Study  Patient Details Name: Andrew Frey MRN: [id number] Date of Birth: 12-Jul-1922 Today's Date: 10/18/2021 Time: SLP Start Time (ACUTE ONLY): 0831 -SLP Stop Time (ACUTE ONLY): 0846 SLP Time Calculation (min) (ACUTE ONLY): 15 min Past Medical History: Past Medical History: Diagnosis Date  Anemia, unspecified   BPH (benign prostatic hyperplasia)   CAD (coronary artery disease)   Calculus of gallbladder without mention of cholecystitis or obstruction   DDD (degenerative disc disease)   DJD (degenerative joint disease)   Dyspnea   HLD (hyperlipidemia)   HTN (hypertension)   Low testosterone   Lower back pain   MI (myocardial infarction) (HCC)   Osteoarthritis   Other testicular hypofunction   Spinal stenosis  Past Surgical History: Past Surgical History: Procedure Laterality Date  APPENDECTOMY  1965  stent placed  2013  TOTAL HIP ARTHROPLASTY    TOTAL  KNEE ARTHROPLASTY  2007  left  TOTAL KNEE ARTHROPLASTY  2005  right HPI: 87 y/o male admitted on 11/30 due to SOB with COPD exacerbation and concern for PNA. Moved to ICU 12/2 with increased respiratory distress and agitation.  PMHx: CHF, COPD, dementia, anemia, BPH, CAD, DJD, HLD, HTN, and spinal stenosis  Subjective: very HOH, drowsy  Recommendations for follow up therapy are one component of a multi-disciplinary discharge planning process, led by the attending physician.  Recommendations may be updated based on patient status, additional functional  criteria and insurance authorization. Assessment / Plan / Recommendation Clinical Impressions 10/18/2021 Clinical Impression Pt has an oropharyngeal dysphagia that is likely impacted by current mentation and overall deconditioning, which per ddaughter's report has fluctuated a lot while he has been in the hospital. He has incomplete labial seal with passive acceptance of boluses with significant amounts of anterior loss especially with thin liquids. It is very challenging to get thin liquids to stay in his mouth, but he also does not suck through a straw despite repeated attempts across the study. He also did not make any attempts to masticate a cracker, so this was not left in his mouth. Oral transit is slow but he does initiate it consistently, leaving lingual residue behind. A combination of reduced base of tongue retraciton, incomplete epiglottic inversion, and inconsistent timing result in at least penetration into the laryngeal vestibule fairly consistently with thin and nectar thick liquids. Pt also could not maintain an upright position throughout the study despite assistance provided from staff, so it was visibility of the vocal folds was limited. Therefore, although penetration can be confirmed, aspiration cannot be excluded. He at least did not clear penetration, putting him at risk for aspiration across a meal, and if he did aspirate during this study it would have been silent as no coughing was noted. Heavier boluses, such as honey thick liquids and purees, leave mild residue in the valleculae but also provide more weight to invert the epiglottis, and as a result he has improved airway protection. Per conversation with daugther on previous date, she would be agreeable to diet modifications to try to reduce aspiration risk. Given his fluctuating performance, recommend a more conservative diet of Dys 1 (puree) foods and honey thick liquids. SLP will f/u for ongoing tx acutely including education with  family with f/u indicated at next level of care as well. SLP Visit Diagnosis Dysphagia, oropharyngeal phase (R13.12) Attention and concentration deficit following -- Frontal lobe and executive function deficit following -- Impact on safety and function Moderate aspiration risk;Risk for inadequate nutrition/hydration   Treatment Recommendations 10/18/2021 Treatment Recommendations Therapy as outlined in treatment plan below   Prognosis 10/18/2021 Prognosis for Safe Diet Advancement Fair Barriers to Reach Goals Cognitive deficits Barriers/Prognosis Comment -- Diet Recommendations 10/18/2021 SLP Diet Recommendations Dysphagia 1 (Puree) solids;Honey thick liquids Liquid Administration via Cup;Spoon Medication Administration Crushed with puree Compensations Minimize environmental distractions;Slow rate;Small sips/bites Postural Changes Seated upright at 90 degrees;Remain semi-upright after after feeds/meals (Comment)   Other Recommendations 10/18/2021 Recommended Consults -- Oral Care Recommendations Oral care BID Other Recommendations Order thickener from pharmacy;Prohibited food (jello, ice cream, thin soups);Remove water pitcher;Have oral suction available Follow Up Recommendations Home health SLP Assistance recommended at discharge Frequent or constant Supervision/Assistance Functional Status Assessment Patient has had a recent decline in their functional status and/or demonstrates limited ability to make significant improvements in function in a reasonable and predictable amount of time Frequency and Duration  10/18/2021 Speech Therapy Frequency (ACUTE  ONLY) min 2x/week Treatment Duration 2 weeks   Oral Phase 10/18/2021 Oral Phase Impaired Oral - Pudding Teaspoon -- Oral - Pudding Cup -- Oral - Honey Teaspoon -- Oral - Honey Cup -- Oral - Nectar Teaspoon -- Oral - Nectar Cup Delayed oral transit;Right anterior bolus loss;Left anterior bolus loss;Reduced posterior propulsion;Lingual/palatal residue Oral - Nectar Straw --  Oral - Thin Teaspoon -- Oral - Thin Cup Delayed oral transit;Right anterior bolus loss;Left anterior bolus loss;Reduced posterior propulsion;Lingual/palatal residue Oral - Thin Straw -- Oral - Puree Delayed oral transit;Reduced posterior propulsion;Lingual/palatal residue Oral - Mech Soft -- Oral - Regular -- Oral - Multi-Consistency -- Oral - Pill -- Oral Phase - Comment --  Pharyngeal Phase 10/18/2021 Pharyngeal Phase Impaired Pharyngeal- Pudding Teaspoon -- Pharyngeal -- Pharyngeal- Pudding Cup -- Pharyngeal -- Pharyngeal- Honey Teaspoon -- Pharyngeal -- Pharyngeal- Honey Cup Reduced tongue base retraction;Pharyngeal residue - valleculae Pharyngeal -- Pharyngeal- Nectar Teaspoon -- Pharyngeal -- Pharyngeal- Nectar Cup Reduced tongue base retraction;Reduced epiglottic inversion;Penetration/Aspiration before swallow;Penetration/Apiration after swallow;Pharyngeal residue - valleculae Pharyngeal (No Data) Pharyngeal- Nectar Straw -- Pharyngeal -- Pharyngeal- Thin Teaspoon -- Pharyngeal -- Pharyngeal- Thin Cup Reduced tongue base retraction;Reduced epiglottic inversion;Penetration/Aspiration before swallow;Penetration/Apiration after swallow Pharyngeal (No Data) Pharyngeal- Thin Straw -- Pharyngeal -- Pharyngeal- Puree Reduced tongue base retraction;Pharyngeal residue - valleculae Pharyngeal -- Pharyngeal- Mechanical Soft -- Pharyngeal -- Pharyngeal- Regular -- Pharyngeal -- Pharyngeal- Multi-consistency -- Pharyngeal -- Pharyngeal- Pill -- Pharyngeal -- Pharyngeal Comment --  Cervical Esophageal Phase  10/18/2021 Cervical Esophageal Phase WFL Pudding Teaspoon -- Pudding Cup -- Honey Teaspoon -- Honey Cup -- Nectar Teaspoon -- Nectar Cup -- Nectar Straw -- Thin Teaspoon -- Thin Cup -- Thin Straw -- Puree -- Mechanical Soft -- Regular -- Multi-consistency -- Pill -- Cervical Esophageal Comment -- Osie Bond., M.A. [place] Pager [phone] Office [phone] 10/18/2021, 9:36 AM                      ECHOCARDIOGRAM COMPLETE  Result Date: 10/16/2021    ECHOCARDIOGRAM REPORT   Patient Name:   Andrew Frey Date of Exam: 10/16/2021 Medical Rec #:  [id number]       Height:       67.0 in Accession #:    [id number]      Weight:       165.1 lb Date of Birth:  [birth year]       BSA:          1.864 m Patient Age:    42 years        BP:           144/80 mmHg Patient Gender: M               HR:           88 bpm. Exam Location:  Inpatient Procedure: 2D Echo, Cardiac Doppler and Color Doppler Indications:    Atrial fibrillation  History:        Patient has no prior history of Echocardiogram examinations.                 Arrythmias:Atrial Fibrillation; Signs/Symptoms:Altered Mental                 Status.  Sonographer:    Merrie Roof RDCS Referring Phys: [id number] [facility]  Sonographer Comments: No subcostal window. Image acquisition challenging due to uncooperative patient. IMPRESSIONS  1. Left ventricular ejection fraction, by estimation, is 55 to 60%. The left ventricle has normal  function. The left ventricle has no regional wall motion abnormalities. There is moderate asymmetric left ventricular hypertrophy of the basal-septal segment. Left ventricular diastolic parameters are indeterminate.  2. Right ventricular systolic function is mildly reduced. The right ventricular size is normal. There is mildly elevated pulmonary artery systolic pressure.  3. Left atrial size was moderately dilated.  4. Right atrial size was moderately dilated.  5. The mitral valve is normal in structure. No evidence of mitral valve regurgitation. No evidence of mitral stenosis.  6. Tricuspid valve regurgitation is mild to moderate.  7. The aortic valve is tricuspid. Aortic valve regurgitation is mild. Aortic valve sclerosis/calcification is present, without any evidence of aortic stenosis.  8. Aortic dilatation noted. Aneurysm of the ascending aorta, measuring 45 mm. There is mild dilatation of the aortic root, measuring 40 mm. FINDINGS   Left Ventricle: Left ventricular ejection fraction, by estimation, is 55 to 60%. The left ventricle has normal function. The left ventricle has no regional wall motion abnormalities. The left ventricular internal cavity size was normal in size. There is  moderate asymmetric left ventricular hypertrophy of the basal-septal segment. Left ventricular diastolic parameters are indeterminate. Right Ventricle: The right ventricular size is normal. Right vetricular wall thickness was not well visualized. Right ventricular systolic function is mildly reduced. There is mildly elevated pulmonary artery systolic pressure. Left Atrium: Left atrial size was moderately dilated. Right Atrium: Right atrial size was moderately dilated. Pericardium: Trivial pericardial effusion is present. Mitral Valve: The mitral valve is normal in structure. No evidence of mitral valve regurgitation. No evidence of mitral valve stenosis. Tricuspid Valve: The tricuspid valve is normal in structure. Tricuspid valve regurgitation is mild to moderate. Aortic Valve: The aortic valve is tricuspid. Aortic valve regurgitation is mild. Aortic valve sclerosis/calcification is present, without any evidence of aortic stenosis. Aortic valve mean gradient measures 6.0 mmHg. Aortic valve peak gradient measures 11.8 mmHg. Aortic valve area, by VTI measures 2.04 cm. Pulmonic Valve: The pulmonic valve was not well visualized. Pulmonic valve regurgitation is trivial. Aorta: Aortic dilatation noted. There is mild dilatation of the aortic root, measuring 40 mm. There is an aneurysm involving the ascending aorta measuring 45 mm. IAS/Shunts: The interatrial septum was not well visualized.  LEFT VENTRICLE PLAX 2D LVIDd:         4.90 cm LVIDs:         3.30 cm LV PW:         1.10 cm LV IVS:        1.10 cm LVOT diam:     2.00 cm LV SV:         64 LV SV Index:   34 LVOT Area:     3.14 cm  RIGHT VENTRICLE RV Basal diam:  3.40 cm LEFT ATRIUM             Index        RIGHT  ATRIUM           Index LA diam:        3.40 cm 1.82 cm/m   RA Area:     25.70 cm LA Vol (A2C):   78.9 ml 42.33 ml/m  RA Volume:   72.90 ml  39.11 ml/m LA Vol (A4C):   69.1 ml 37.07 ml/m LA Biplane Vol: 77.6 ml 41.63 ml/m  AORTIC VALVE AV Area (Vmax):    1.94 cm AV Area (Vmean):   1.99 cm AV Area (VTI):     2.04 cm AV Vmax:  172.00 cm/s AV Vmean:          115.000 cm/s AV VTI:            0.313 m AV Peak Grad:      11.8 mmHg AV Mean Grad:      6.0 mmHg LVOT Vmax:         106.00 cm/s LVOT Vmean:        72.900 cm/s LVOT VTI:          0.203 m LVOT/AV VTI ratio: 0.65  AORTA Ao Root diam: 3.95 cm Ao Asc diam:  4.50 cm TRICUSPID VALVE TR Peak grad:   35.3 mmHg TR Vmax:        297.00 cm/s  SHUNTS Systemic VTI:  0.20 m Systemic Diam: 2.00 cm Epifanio Lesches MD Electronically signed by Epifanio Lesches MD Signature Date/Time: 10/16/2021/7:11:48 PM    Final     Labs: BNP (last 3 results) Recent Labs    10/12/21 0128  BNP 154.4*   Basic Metabolic Panel: Recent Labs  Lab 10/14/21 0608 10/15/21 0646 10/16/21 0123 10/17/21 0144 10/18/21 0041  NA 142 139 137 138 142  K 3.8 3.8 3.7 3.6 3.4*  CL 112* 110 110 107 108  CO2 23 21* 20* 20* 24  GLUCOSE 100* 110* 95 103* 114*  BUN 26* 21 23 21  29*  CREATININE 1.55* 1.31* 1.27* 1.25* 1.34*  CALCIUM 9.2 9.2 9.4 9.7 10.0   Liver Function Tests: No results for input(s): AST, ALT, ALKPHOS, BILITOT, PROT, ALBUMIN in the last 168 hours.  No results for input(s): LIPASE, AMYLASE in the last 168 hours. No results for input(s): AMMONIA in the last 168 hours. CBC: Recent Labs  Lab 10/13/21 0237 10/14/21 0608 10/15/21 0646 10/16/21 0123 10/17/21 0144  WBC 11.9* 10.9* 10.6* 10.0 10.1  HGB 10.0* 10.5* 10.7* 10.6* 11.4*  HCT 32.3* 33.6* 34.3* 32.9* 36.0*  MCV 97.0 97.4 96.1 94.5 94.7  PLT 129* 146* 162 161 205   Cardiac Enzymes: No results for input(s): CKTOTAL, CKMB, CKMBINDEX, TROPONINI in the last 168 hours. BNP: Invalid input(s):  POCBNP CBG: Recent Labs  Lab 10/14/21 2323 10/15/21 0318 10/15/21 1942 10/15/21 2347 10/16/21 0330  GLUCAP 105* 104* 89 87 88   D-Dimer No results for input(s): DDIMER in the last 72 hours. Hgb A1c No results for input(s): HGBA1C in the last 72 hours. Lipid Profile No results for input(s): CHOL, HDL, LDLCALC, TRIG, CHOLHDL, LDLDIRECT in the last 72 hours. Thyroid function studies No results for input(s): TSH, T4TOTAL, T3FREE, THYROIDAB in the last 72 hours.  Invalid input(s): FREET3 Anemia work up No results for input(s): VITAMINB12, FOLATE, FERRITIN, TIBC, IRON, RETICCTPCT in the last 72 hours. Urinalysis    Component Value Date/Time   COLORURINE YELLOW 10/12/2021 0635   APPEARANCEUR HAZY (A) 10/12/2021 0635   LABSPEC 1.020 10/12/2021 0635   PHURINE 5.5 10/12/2021 0635   GLUCOSEU NEGATIVE 10/12/2021 0635   HGBUR NEGATIVE 10/12/2021 0635   BILIRUBINUR NEGATIVE 10/12/2021 0635   KETONESUR NEGATIVE 10/12/2021 0635   PROTEINUR 30 (A) 10/12/2021 0635   NITRITE NEGATIVE 10/12/2021 0635   LEUKOCYTESUR LARGE (A) 10/12/2021 0635   Sepsis Labs Invalid input(s): PROCALCITONIN,  WBC,  LACTICIDVEN Microbiology Recent Results (from the past 240 hour(s))  Resp Panel by RT-PCR (Flu A&B, Covid) Nasopharyngeal Swab     Status: None   Collection Time: 10/11/21 11:11 PM   Specimen: Nasopharyngeal Swab; Nasopharyngeal(NP) swabs in vial transport medium  Result Value Ref Range Status   SARS Coronavirus  2 by RT PCR NEGATIVE NEGATIVE Final    Comment: (NOTE) SARS-CoV-2 target nucleic acids are NOT DETECTED.  The SARS-CoV-2 RNA is generally detectable in upper respiratory specimens during the acute phase of infection. The lowest concentration of SARS-CoV-2 viral copies this assay can detect is 138 copies/mL. A negative result does not preclude SARS-Cov-2 infection and should not be used as the sole basis for treatment or other patient management decisions. A negative result may occur  with  improper specimen collection/handling, submission of specimen other than nasopharyngeal swab, presence of viral mutation(s) within the areas targeted by this assay, and inadequate number of viral copies(<138 copies/mL). A negative result must be combined with clinical observations, patient history, and epidemiological information. The expected result is Negative.  Fact Sheet for Patients:  EntrepreneurPulse.com.au  Fact Sheet for Healthcare Providers:  IncredibleEmployment.be  This test is no t yet approved or cleared by the [place] FDA and  has been authorized for detection and/or diagnosis of SARS-CoV-2 by FDA under an Emergency Use Authorization (EUA). This EUA will remain  in effect (meaning this test can be used) for the duration of the COVID-19 declaration under Section 564(b)(1) of the Act, 21 U.S.C.section 360bbb-3(b)(1), unless the authorization is terminated  or revoked sooner.       Influenza A by PCR NEGATIVE NEGATIVE Final   Influenza B by PCR NEGATIVE NEGATIVE Final    Comment: (NOTE) The Xpert Xpress SARS-CoV-2/FLU/RSV plus assay is intended as an aid in the diagnosis of influenza from Nasopharyngeal swab specimens and should not be used as a sole basis for treatment. Nasal washings and aspirates are unacceptable for Xpert Xpress SARS-CoV-2/FLU/RSV testing.  Fact Sheet for Patients: EntrepreneurPulse.com.au  Fact Sheet for Healthcare Providers: IncredibleEmployment.be  This test is not yet approved or cleared by the [place] FDA and has been authorized for detection and/or diagnosis of SARS-CoV-2 by FDA under an Emergency Use Authorization (EUA). This EUA will remain in effect (meaning this test can be used) for the duration of the COVID-19 declaration under Section 564(b)(1) of the Act, 21 U.S.C. section 360bbb-3(b)(1), unless the authorization is terminated  or revoked.  Performed at [facility], [place] [address] [place], [place] [address]   Blood culture (routine x 2)     Status: Abnormal   Collection Time: 10/12/21  1:23 AM   Specimen: BLOOD  Result Value Ref Range Status   Specimen Description BLOOD RIGHT ANTECUBITAL  Final   Special Requests   Final    BOTTLES DRAWN AEROBIC AND ANAEROBIC Blood Culture adequate volume   Culture  Setup Time   Final    GRAM POSITIVE COCCI IN BOTH AEROBIC AND ANAEROBIC BOTTLES CRITICAL RESULT CALLED TO, READ BACK BY AND VERIFIED WITH: PHARMD GREG ABBOTT 10/13/21@5 :41 BY TW    Culture (A)  Final    STAPHYLOCOCCUS CAPITIS THE SIGNIFICANCE OF ISOLATING THIS ORGANISM FROM A SINGLE VENIPUNCTURE CANNOT BE PREDICTED WITHOUT FURTHER CLINICAL AND CULTURE CORRELATION. SUSCEPTIBILITIES AVAILABLE ONLY ON REQUEST. Performed at [facility], [place] [address], [place], [place] [address]    Report Status 10/15/2021 FINAL  Final  Blood Culture ID Panel (Reflexed)     Status: Abnormal   Collection Time: 10/12/21  1:23 AM  Result Value Ref Range Status   Enterococcus faecalis NOT DETECTED NOT DETECTED Final   Enterococcus Faecium NOT DETECTED NOT DETECTED Final   Listeria monocytogenes NOT DETECTED NOT DETECTED Final   Staphylococcus species DETECTED (A) NOT DETECTED Final  Comment: CRITICAL RESULT CALLED TO, READ BACK BY AND VERIFIED WITH: PHARMD GREG ABBOTT 10/13/21@5 :41 BY TW    Staphylococcus aureus (BCID) NOT DETECTED NOT DETECTED Final   Staphylococcus epidermidis NOT DETECTED NOT DETECTED Final   Staphylococcus lugdunensis NOT DETECTED NOT DETECTED Final   Streptococcus species NOT DETECTED NOT DETECTED Final   Streptococcus agalactiae NOT DETECTED NOT DETECTED Final   Streptococcus pneumoniae NOT DETECTED NOT DETECTED Final   Streptococcus pyogenes NOT DETECTED NOT DETECTED Final   A.calcoaceticus-baumannii NOT DETECTED NOT DETECTED Final   Bacteroides fragilis NOT DETECTED NOT DETECTED Final    Enterobacterales NOT DETECTED NOT DETECTED Final   Enterobacter cloacae complex NOT DETECTED NOT DETECTED Final   Escherichia coli NOT DETECTED NOT DETECTED Final   Klebsiella aerogenes NOT DETECTED NOT DETECTED Final   Klebsiella oxytoca NOT DETECTED NOT DETECTED Final   Klebsiella pneumoniae NOT DETECTED NOT DETECTED Final   Proteus species NOT DETECTED NOT DETECTED Final   Salmonella species NOT DETECTED NOT DETECTED Final   Serratia marcescens NOT DETECTED NOT DETECTED Final   Haemophilus influenzae NOT DETECTED NOT DETECTED Final   Neisseria meningitidis NOT DETECTED NOT DETECTED Final   Pseudomonas aeruginosa NOT DETECTED NOT DETECTED Final   Stenotrophomonas maltophilia NOT DETECTED NOT DETECTED Final   Candida albicans NOT DETECTED NOT DETECTED Final   Candida auris NOT DETECTED NOT DETECTED Final   Candida glabrata NOT DETECTED NOT DETECTED Final   Candida krusei NOT DETECTED NOT DETECTED Final   Candida parapsilosis NOT DETECTED NOT DETECTED Final   Candida tropicalis NOT DETECTED NOT DETECTED Final   Cryptococcus neoformans/gattii NOT DETECTED NOT DETECTED Final    Comment: Performed at [facility], [address], [place], [place] [address]  MRSA Next Gen by PCR, Nasal     Status: Abnormal   Collection Time: 10/13/21  6:12 PM   Specimen: Nasal Mucosa; Nasal Swab  Result Value Ref Range Status   MRSA by PCR Next Gen DETECTED (A) NOT DETECTED Final    Comment: CRITICAL RESULT CALLED TO, READ BACK BY AND VERIFIED WITH: MONICA CROSS RN 10/13/2021 @2100  BY JW (NOTE) The GeneXpert MRSA Assay (FDA approved for NASAL specimens only), is one component of a comprehensive MRSA colonization surveillance program. It is not intended to diagnose MRSA infection nor to guide or monitor treatment for MRSA infections. Test performance is not FDA approved in patients less than 18 years old. Performed at [facility], [place] [address] [place], [place] [address]   Culture, blood  (Routine X 2) w Reflex to ID Panel     Status: None (Preliminary result)   Collection Time: 10/14/21 10:50 AM   Specimen: BLOOD  Result Value Ref Range Status   Specimen Description BLOOD LEFT ANTECUBITAL  Final   Special Requests   Final    BOTTLES DRAWN AEROBIC AND ANAEROBIC Blood Culture adequate volume   Culture   Final    NO GROWTH 4 DAYS Performed at [facility], [place] [address] [place], [place] [address]    Report Status PENDING  Incomplete  Culture, blood (Routine X 2) w Reflex to ID Panel     Status: None (Preliminary result)   Collection Time: 10/14/21 11:06 AM   Specimen: BLOOD LEFT HAND  Result Value Ref Range Status   Specimen Description BLOOD LEFT HAND  Final   Special Requests   Final    BOTTLES DRAWN AEROBIC AND ANAEROBIC Blood Culture adequate volume   Culture   Final  NO GROWTH 4 DAYS Performed at [facility], [place] [address], [place], [place] [address]    Report Status PENDING  Incomplete  SARS CORONAVIRUS 2 (TAT 6-24 HRS) Nasopharyngeal Nasopharyngeal Swab     Status: None   Collection Time: 10/17/21  4:15 PM   Specimen: Nasopharyngeal Swab  Result Value Ref Range Status   SARS Coronavirus 2 NEGATIVE NEGATIVE Final    Comment: (NOTE) SARS-CoV-2 target nucleic acids are NOT DETECTED.  The SARS-CoV-2 RNA is generally detectable in upper and lower respiratory specimens during the acute phase of infection. Negative results do not preclude SARS-CoV-2 infection, do not rule out co-infections with other pathogens, and should not be used as the sole basis for treatment or other patient management decisions. Negative results must be combined with clinical observations, patient history, and epidemiological information. The expected result is Negative.  Fact Sheet for Patients: SugarRoll.be  Fact Sheet for Healthcare Providers: https://www.woods-mathews.com/  This test is not yet approved or cleared by the  [place] FDA and  has been authorized for detection and/or diagnosis of SARS-CoV-2 by FDA under an Emergency Use Authorization (EUA). This EUA will remain  in effect (meaning this test can be used) for the duration of the COVID-19 declaration under Se ction 564(b)(1) of the Act, 21 U.S.C. section 360bbb-3(b)(1), unless the authorization is terminated or revoked sooner.  Performed at [facility], [place] [address], [place], [place] [address]      Time coordinating discharge: 35 minutes  SIGNED: Antonieta Pert, MD  [facility] 10/19/2021, 10:18 AM  If 7PM-7AM, please contact night-coverage [email or web address]

## 2021-10-18 NOTE — TOC Progression Note (Signed)
Transition of Care [facility]) - Initial/Assessment Note    Patient Details  Name: Andrew Frey MRN: [id number] Date of Birth: Feb 03, 1922  Transition of Care [facility]) CM/SW Contact:    Ralene Bathe, LCSWA Phone Number: 10/18/2021, 3:11 PM  Clinical Narrative:                 CSW received a call from [place] with [facility]. [phone]. [phone])  The facility is requesting that the oxygen tanks be delivered prior to the patient discharging.    CSW assigned to the floor notified of request.  Expected Discharge Plan: Assisted Living Barriers to Discharge: Continued Medical Work up   Patient Goals and CMS Choice Patient states their goals for this hospitalization and ongoing recovery are:: to return to ALF CMS Medicare.gov Compare Post Acute Care list provided to:: Other (Comment Required) Choice offered to / list presented to : Adult Children  Expected Discharge Plan and Services Expected Discharge Plan: Assisted Living       Living arrangements for the past 2 months: Independent Living Facility Expected Discharge Date: 10/18/21               DME Arranged: Suction DME Agency: [employer] Date DME Agency Contacted: 10/18/21 Time DME Agency Contacted: 1357 Representative spoke with at DME Agency: Velna Hatchet HH Arranged: PT, OT, Speech Therapy HH Agency: [facility] Date [facility] Agency Contacted: 10/18/21 Time HH Agency Contacted: 1357 Representative spoke with at [facility] Agency: Amy  Prior Living Arrangements/Services Living arrangements for the past 2 months: Independent Living Facility Lives with:: Self Patient language and need for interpreter reviewed:: Yes        Need for Family Participation in Patient Care: Yes (Comment) Care giver support system in place?: Yes (comment)   Criminal Activity/Legal Involvement Pertinent to Current Situation/Hospitalization: No - Comment as needed  Activities of Daily Living      Permission Sought/Granted                   Emotional Assessment       Orientation: : Fluctuating Orientation (Suspected and/or reported Sundowners) Alcohol / Substance Use: Not Applicable Psych Involvement: No (comment)  Admission diagnosis:  Severe sepsis (HCC) [A41.9, R65.20] Community acquired pneumonia of right lower lobe of lung [J18.9] Sepsis with acute hypoxic respiratory failure without septic shock, due to unspecified organism (HCC) [A41.9, R65.20, J96.01] Patient Active Problem List   Diagnosis Date Noted   Severe sepsis (HCC) 10/12/2021   CAP (community acquired pneumonia) 10/12/2021   SOB (shortness of breath) 10/12/2021   Lactic acidosis 10/12/2021   Fever 10/12/2021   Elevated serum creatinine 10/12/2021   HLD (hyperlipidemia)    HTN (hypertension)    BPH (benign prostatic hyperplasia)    Spinal stenosis    DDD (degenerative disc disease)    CAD (coronary artery disease)    Anemia, unspecified    Calculus of gallbladder without mention of cholecystitis or obstruction    Lower back pain    CARDIOMEGALY 02/18/2008   LUNG NODULE 02/18/2008   DYSPNEA 02/18/2008   PCP:  Philemon Kingdom, MD Pharmacy:  No Pharmacies Listed    Social Determinants of Health (SDOH) Interventions    Readmission Risk Interventions No flowsheet data found.

## 2021-10-19 LAB — CULTURE, BLOOD (ROUTINE X 2)
Culture: NO GROWTH
Culture: NO GROWTH
Special Requests: ADEQUATE
Special Requests: ADEQUATE

## 2021-10-19 NOTE — TOC Transition Note (Signed)
Transition of Care [facility]) - CM/SW Discharge Note   Patient Details  Name: ESMERALDA BLANFORD MRN: [id number] Date of Birth: [birth year]  Transition of Care [facility]) CM/SW Contact:  Lockie Pares, RN Phone Number: 10/19/2021, 12:34 PM   Clinical Narrative:     Patient will be discharging today. Suction equipment sent to [facility]. Called daughter to update her on discharge. Amy from [facility] called to tell her of DC today  Final next level of care: Assisted Living (with home health) Barriers to Discharge: Continued Medical Work up   Patient Goals and CMS Choice Patient states their goals for this hospitalization and ongoing recovery are:: to return to ALF CMS Medicare.gov Compare Post Acute Care list provided to:: Other (Comment Required) Choice offered to / list presented to : Adult Children  Discharge Placement             [facility]          Discharge Plan and Services                DME Arranged: Suction DME Agency: [facility] Date DME Agency Contacted: 10/18/21 Time DME Agency Contacted: 1357 Representative spoke with at DME Agency: Velna Hatchet HH Arranged: PT, OT, Speech Therapy HH Agency: [facility] Date [facility] Agency Contacted: 10/18/21 Time HH Agency Contacted: 1357 Representative spoke with at [facility] Agency: Amy  Social Determinants of Health (SDOH) Interventions     Readmission Risk Interventions No flowsheet data found.

## 2021-10-19 NOTE — TOC Transition Note (Signed)
Transition of Care [facility]) - CM/SW Discharge Note   Patient Details  Name: Andrew Frey MRN: [id number] Date of Birth: 01/12/1922  Transition of Care [facility]) CM/SW Contact:  Jimmy Picket, LCSW Phone Number: 10/19/2021, 12:27 PM   Clinical Narrative:      Per MD patient ready for DC to [facility]. RN, patient, patient's family, and facility notified of DC. Discharge Summary and FL2 sent to facility. DC packet on chart. Ambulance transport requested for patient.    RN to call report to [phone], ask for station 1 [place].   CSW will sign off for now as social work intervention is no longer needed. Please consult [place] again if new needs arise.   Final next level of care: Assisted Living Barriers to Discharge: Continued Medical Work up   Patient Goals and CMS Choice Patient states their goals for this hospitalization and ongoing recovery are:: to return to ALF CMS Medicare.gov Compare Post Acute Care list provided to:: Other (Comment Required) Choice offered to / list presented to : Adult Children  Discharge Placement                       Discharge Plan and Services                DME Arranged: Suction DME Agency: [facility] Date DME Agency Contacted: 10/18/21 Time DME Agency Contacted: 1357 Representative spoke with at DME Agency: Velna Hatchet HH Arranged: PT, OT, Speech Therapy HH Agency: [facility] Date [facility] Agency Contacted: 10/18/21 Time HH Agency Contacted: 1357 Representative spoke with at [facility] Agency: Amy  Social Determinants of Health (SDOH) Interventions     Readmission Risk Interventions No flowsheet data found.  Jimmy Picket, LCSW Clinical Social Worker

## 2021-10-19 NOTE — Progress Notes (Signed)
Report given to Ofilia Neas RN at [facility].

## 2021-10-19 NOTE — Progress Notes (Signed)
Pt resting in bed comfortably in no acute distress. Discharge paperwork ready in pt's chart, PIV removed, and report has been called. Awaiting PTAR to transport pt to ALF.

## 2021-10-19 NOTE — Progress Notes (Signed)
PTAR has arrived to transport pt to [facility] via stretcher. Pt alert and oriented to baseline in no acute distress. Pt belongings taken with pt including both of pt's hearing aids. Discharge paperwork taken by EMTs.

## 2021-10-19 NOTE — Progress Notes (Signed)
Speech Language Pathology Treatment: Dysphagia  Patient Details Name: Andrew Frey MRN: [id number] DOB: 11/07/1922 Today's Date: 10/19/2021 Time: 6283-1517 SLP Time Calculation (min) (ACUTE ONLY): 12 min  Assessment / Plan / Recommendation Clinical Impression  Pt was seen for f/u after MBS, much more alert this morning and even more engaged in self-feeding. SLP did provide assist with cup due to anterior loss, at least in part from generalized weakness but also perhaps related to tremor. He continues to accept liquids more consistently from a cup than from a straw though Baseline cough is not noted today but he did have cough x1 after a sip of honey thick liquids. This was not observed across any other trials though. His oral clearance is more thorough with purees today so small bites of more solid textures were offered. Mastication was slow, although per daughter he is typically slow across meal times, so this may not be too different from his baseline. Mild-moderate lingual residue was observed that cleared well with a liquid wash. Recommend advancing diet to Dys 2 solids, although would still use honey thick liquids acutely.   SLP also called pt's daughter to review results of MBS and answer any questions regarding current POC. Reiterated that MBS was performed when pt's mentation was quite altered, and as a result, we were able to see how he may be protecting his airway during these moments when he may be at a heightened risk for aspiration to occur. Diet recommendation was therefore more conservative in an attempt to reduce dysphagia-related adverse outcomes, but would encourage SLP f/u at next level of care to facilitate advancement and after he recovers from this acute illness, when pt is back in his more familiar environment and his performance may not fluctuate so greatly. She is in agreement with this plan. SLP will continue to follow acutely as he remains inpatient.    HPI HPI: 87 y/o male  admitted on 11/30 due to SOB with COPD exacerbation and concern for PNA. Moved to ICU 12/2 with increased respiratory distress and agitation.  PMHx: CHF, COPD, dementia, anemia, BPH, CAD, DJD, HLD, HTN, and spinal stenosis      SLP Plan  Continue with current plan of care      Recommendations for follow up therapy are one component of a multi-disciplinary discharge planning process, led by the attending physician.  Recommendations may be updated based on patient status, additional functional criteria and insurance authorization.    Recommendations  Diet recommendations: Dysphagia 2 (fine chop);Honey-thick liquid Liquids provided via: Cup Medication Administration: Crushed with puree Supervision: Staff to assist with self feeding;Full supervision/cueing for compensatory strategies Compensations: Minimize environmental distractions;Slow rate;Small sips/bites Postural Changes and/or Swallow Maneuvers: Seated upright 90 degrees;Upright 30-60 min after meal                Follow Up Recommendations: Home health SLP Assistance recommended at discharge: Frequent or constant Supervision/Assistance SLP Visit Diagnosis: Dysphagia, oropharyngeal phase (R13.12) Plan: Continue with current plan of care       GO                Mahala Menghini., M.A. CCC-SLP Acute Rehabilitation Services Pager [phone] Office [phone]  10/19/2021, 12:05 PM

## 2021-10-19 NOTE — Progress Notes (Signed)
Patient could not be discharged 12/7 due to facility not having oxygen. Seen this morning is alert awake, did eat decent amount of food per nursing staff. Is very hard of hearing. Will have continued ongoing issues with this delirium/dementia but appears fairly stable per daughter he does have a primary care doctor as well as psychiatrist at the facility will need to work with this medication around to regulate his sleep-wake cycle.  Daughter prefers him to be discharged back to the facility soon as possible

## 2021-10-19 NOTE — Progress Notes (Signed)
Attempt x 1 to call report to [facility]. Nurse was unavailable. Will call back shortly.

## 2021-10-19 NOTE — NC FL2 (Signed)
[place] MEDICAID FL2 LEVEL OF CARE SCREENING TOOL     IDENTIFICATION  Patient Name: Andrew Frey Birthdate: [birth year] Sex: male Admission Date (Current Location): 10/11/2021  [facility] and [place] Number:  [employer] and Address:  [place] [facility], [address], [place], [place] [address]      Provider Number: [id number]  Attending Physician Name and Address:  Lanae Boast, MD  Relative Name and Phone Number:       Current Level of Care: Hospital Recommended Level of Care: Assisted Living Facility Prior Approval Number:    Date Approved/Denied:   PASRR Number:    Discharge Plan: Other (Comment) (ALF)    Current Diagnoses: Patient Active Problem List   Diagnosis Date Noted   Severe sepsis (HCC) 10/12/2021   CAP (community acquired pneumonia) 10/12/2021   SOB (shortness of breath) 10/12/2021   Lactic acidosis 10/12/2021   Fever 10/12/2021   Elevated serum creatinine 10/12/2021   HLD (hyperlipidemia)    HTN (hypertension)    BPH (benign prostatic hyperplasia)    Spinal stenosis    DDD (degenerative disc disease)    CAD (coronary artery disease)    Anemia, unspecified    Calculus of gallbladder without mention of cholecystitis or obstruction    Lower back pain    CARDIOMEGALY 02/18/2008   LUNG NODULE 02/18/2008   DYSPNEA 02/18/2008    Orientation RESPIRATION BLADDER Height & Weight     Time, Situation, Place  O2 (2L) Incontinent Weight: 145 lb 4.5 oz (65.9 kg) Height:  5\' 7"  (170.2 cm)  BEHAVIORAL SYMPTOMS/MOOD NEUROLOGICAL BOWEL NUTRITION STATUS      Continent Diet (Regular)  AMBULATORY STATUS COMMUNICATION OF NEEDS Skin   Extensive Assist Verbally Normal                       Personal Care Assistance Level of Assistance  Bathing, Feeding, Dressing Bathing Assistance: Maximum assistance Feeding assistance: Limited assistance Dressing Assistance: Limited assistance     Functional Limitations Info  Hearing    Hearing Info: Impaired      SPECIAL CARE FACTORS FREQUENCY                       Contractures Contractures Info: Not present    Additional Factors Info  Code Status, Allergies Code Status Info: Full Allergies Info: NKA           Current Medications (10/19/2021):  This is the current hospital active medication list Current Facility-Administered Medications  Medication Dose Route Frequency Provider Last Rate Last Admin   acetaminophen (TYLENOL) tablet 650 mg  650 mg Oral Q6H PRN Howerter, Justin B, DO       Or   acetaminophen (TYLENOL) suppository 650 mg  650 mg Rectal Q6H PRN Howerter, Justin B, DO   650 mg at 10/14/21 0000   albuterol (PROVENTIL) (2.5 MG/3ML) 0.083% nebulizer solution 2.5 mg  2.5 mg Nebulization Q6H PRN Kc, Ramesh, MD       arformoterol (BROVANA) nebulizer solution 15 mcg  15 mcg Nebulization BID 14/03/22, MD   15 mcg at 10/19/21 14/08/22   ARIPiprazole (ABILIFY) tablet 7 mg  7 mg Oral QHS Kc, 6195, MD   7 mg at 10/18/21 2141   artificial tears (LACRILUBE) ophthalmic ointment   Both Eyes Daily 2142, MD   Given at 10/19/21 0934   aspirin chewable tablet 81 mg  81 mg Oral Daily 14/08/22, MD  81 mg at 10/19/21 0934   B-complex with vitamin C tablet   Oral Daily Lanae Boast, MD   1 tablet at 10/19/21 0934   budesonide (PULMICORT) nebulizer solution 0.25 mg  0.25 mg Nebulization BID Luciano Cutter, MD   0.25 mg at 10/19/21 7672   Chlorhexidine Gluconate Cloth 2 % PADS 6 each  6 each Topical Daily Lanae Boast, MD   6 each at 10/17/21 0947   Chlorhexidine Gluconate Cloth 2 % PADS 6 each  6 each Topical Daily Lanae Boast, MD   6 each at 10/19/21 0934   clopidogrel (PLAVIX) tablet 75 mg  75 mg Oral Daily Kc, Ramesh, MD   75 mg at 10/19/21 0934   diltiazem (CARDIZEM CD) 24 hr capsule 120 mg  120 mg Oral Daily Bhagat, Bhavinkumar, PA   120 mg at 10/19/21 0934   guaiFENesin-dextromethorphan (ROBITUSSIN DM) 100-10 MG/5ML syrup 5 mL  5 mL Oral Q4H PRN  Kc, Ramesh, MD   5 mL at 10/18/21 0957   heparin injection 5,000 Units  5,000 Units Subcutaneous Q8H Kc, Ramesh, MD   5,000 Units at 10/18/21 2142   ipratropium-albuterol (DUONEB) 0.5-2.5 (3) MG/3ML nebulizer solution 3 mL  3 mL Nebulization BID Pia Mau D, PA-C   3 mL at 10/19/21 0824   LORazepam (ATIVAN) injection 0.25 mg  0.25 mg Intravenous QHS PRN Kc, Dayna Barker, MD   0.25 mg at 10/18/21 2142   MEDLINE mouth rinse  15 mL Mouth Rinse BID Kc, Ramesh, MD   15 mL at 10/17/21 2159   sertraline (ZOLOFT) tablet 50 mg  50 mg Oral Daily Luciano Cutter, MD   50 mg at 10/19/21 0934   tamsulosin (FLOMAX) capsule 0.4 mg  0.4 mg Oral QHS Kc, Ramesh, MD   0.4 mg at 10/18/21 2142     Discharge Medications: acetaminophen 325 MG tablet Commonly known as: TYLENOL Take 325 mg by mouth 3 (three) times daily as needed for mild pain.    albuterol (2.5 MG/3ML) 0.083% nebulizer solution Commonly known as: PROVENTIL Take 2.5 mg by nebulization 3 (three) times daily.    albuterol 108 (90 Base) MCG/ACT inhaler Commonly known as: VENTOLIN HFA Inhale 2 puffs into the lungs every 4 (four) hours as needed for wheezing or shortness of breath.    amoxicillin 500 MG capsule Commonly known as: AMOXIL Take 2,000 mg by mouth See admin instructions. 1 hour prior to dental appointment    anti-nausea solution Take 15 mLs by mouth as needed for nausea.    arformoterol 15 MCG/2ML Nebu Commonly known as: BROVANA Take 2 mLs (15 mcg total) by nebulization 2 (two) times daily for 14 days.    ARIPiprazole 5 MG tablet Commonly known as: ABILIFY Take 5 mg by mouth at bedtime.    ARTIFICIAL TEAR OP Place 2 drops into both eyes in the morning and at bedtime.    Asmanex (30 Metered Doses) 220 MCG/ACT inhaler Generic drug: mometasone Inhale 2 puffs into the lungs every evening.    aspirin 81 MG chewable tablet Take 81 mg by mouth daily.    Biofreeze 4 % Gel Generic drug: Menthol (Topical Analgesic) Apply 1  application topically 2 (two) times daily as needed (shoulder pain).    clopidogrel 75 MG tablet Commonly known as: PLAVIX Take 75 mg by mouth daily with breakfast.    dextromethorphan 30 MG/5ML liquid Commonly known as: DELSYM Take 10 mLs by mouth 2 (two) times daily as needed for cough.  diltiazem 120 MG 24 hr capsule Commonly known as: CARDIZEM CD Take 1 capsule (120 mg total) by mouth daily. Start taking on: October 19, 2021    guaiFENesin-dextromethorphan 100-10 MG/5ML syrup Commonly known as: ROBITUSSIN DM Take 5 mLs by mouth every 4 (four) hours as needed for up to 7 days for cough.    lidocaine 5 % Commonly known as: LIDODERM Place 1 patch onto the skin daily. Remove & Discard patch within 12 hours or as directed by MD    loperamide 2 MG tablet Commonly known as: IMODIUM A-D Take 2-4 mg by mouth See admin instructions. 4 mg after first loose stool, then 2 mg after each loose stool ** max 4 tabs in 24 hours **    meloxicam 7.5 MG tablet Commonly known as: MOBIC Take 2 tablets by mouth daily.    OVER THE COUNTER MEDICATION Apply 1 application topically See admin instructions. CBD pain stick apply to area of pain  on shoulder 3 times daily as needed for pain    OVER THE COUNTER MEDICATION Take 2 tablets by mouth in the morning and at bedtime. Juice Plus Garden Chew Gummies    OVER THE COUNTER MEDICATION Take 2 tablets by mouth in the morning and at bedtime. Juice Plus Orchard Chews    OVER THE COUNTER MEDICATION Take 2 tablets by mouth in the morning and at bedtime. Juice Plus Vineyard chews    oxyCODONE-acetaminophen 10-325 MG tablet Commonly known as: PERCOCET Take 1 tablet by mouth 4 (four) times daily as needed.    sennosides-docusate sodium 8.6-50 MG tablet Commonly known as: SENOKOT-S Take 1 tablet by mouth in the morning and at bedtime.    sertraline 50 MG tablet Commonly known as: ZOLOFT Take 50 mg by mouth daily.    Systane 0.4-0.3 % Gel  ophthalmic gel Generic drug: Polyethyl Glycol-Propyl Glycol Place 1 application into both eyes in the morning and at bedtime.    tamsulosin 0.4 MG Caps capsule Commonly known as: FLOMAX Take 0.4 mg by mouth at bedtime.    VITAMIN-B COMPLEX PO Take 1 tablet by mouth dail    Relevant Imaging Results:  Relevant Lab Results:   Additional Information SSN: [id number].  Jimmy Picket, [place]

## 2021-10-20 DIAGNOSIS — A419 Sepsis, unspecified organism: Secondary | ICD-10-CM | POA: Diagnosis not present

## 2021-10-20 DIAGNOSIS — R1310 Dysphagia, unspecified: Secondary | ICD-10-CM | POA: Diagnosis not present

## 2021-10-20 DIAGNOSIS — J189 Pneumonia, unspecified organism: Secondary | ICD-10-CM | POA: Diagnosis not present

## 2021-10-20 DIAGNOSIS — I5032 Chronic diastolic (congestive) heart failure: Secondary | ICD-10-CM | POA: Diagnosis not present

## 2021-10-24 DIAGNOSIS — I4891 Unspecified atrial fibrillation: Secondary | ICD-10-CM | POA: Diagnosis not present

## 2021-10-24 DIAGNOSIS — I509 Heart failure, unspecified: Secondary | ICD-10-CM | POA: Diagnosis not present

## 2021-10-24 DIAGNOSIS — R0602 Shortness of breath: Secondary | ICD-10-CM | POA: Diagnosis not present

## 2021-10-24 DIAGNOSIS — J9811 Atelectasis: Secondary | ICD-10-CM | POA: Diagnosis not present

## 2021-10-24 DIAGNOSIS — J209 Acute bronchitis, unspecified: Secondary | ICD-10-CM | POA: Diagnosis not present

## 2021-10-24 DIAGNOSIS — J189 Pneumonia, unspecified organism: Secondary | ICD-10-CM | POA: Diagnosis not present

## 2021-10-24 DIAGNOSIS — R918 Other nonspecific abnormal finding of lung field: Secondary | ICD-10-CM | POA: Diagnosis not present

## 2021-10-24 DIAGNOSIS — I517 Cardiomegaly: Secondary | ICD-10-CM | POA: Diagnosis not present

## 2021-10-24 DIAGNOSIS — R404 Transient alteration of awareness: Secondary | ICD-10-CM | POA: Diagnosis not present

## 2021-10-24 DIAGNOSIS — R0902 Hypoxemia: Secondary | ICD-10-CM | POA: Diagnosis not present

## 2021-10-24 DIAGNOSIS — Z20822 Contact with and (suspected) exposure to covid-19: Secondary | ICD-10-CM | POA: Diagnosis not present

## 2021-10-24 DIAGNOSIS — K449 Diaphragmatic hernia without obstruction or gangrene: Secondary | ICD-10-CM | POA: Diagnosis not present

## 2021-10-25 DIAGNOSIS — J40 Bronchitis, not specified as acute or chronic: Secondary | ICD-10-CM | POA: Diagnosis not present

## 2021-10-25 DIAGNOSIS — R269 Unspecified abnormalities of gait and mobility: Secondary | ICD-10-CM | POA: Diagnosis not present

## 2021-10-25 DIAGNOSIS — R059 Cough, unspecified: Secondary | ICD-10-CM | POA: Diagnosis not present

## 2021-10-25 DIAGNOSIS — M6281 Muscle weakness (generalized): Secondary | ICD-10-CM | POA: Diagnosis not present

## 2021-10-26 DIAGNOSIS — R0902 Hypoxemia: Secondary | ICD-10-CM | POA: Diagnosis not present

## 2021-10-26 DIAGNOSIS — I959 Hypotension, unspecified: Secondary | ICD-10-CM | POA: Diagnosis not present

## 2021-10-26 DIAGNOSIS — J9610 Chronic respiratory failure, unspecified whether with hypoxia or hypercapnia: Secondary | ICD-10-CM | POA: Diagnosis not present

## 2021-10-26 DIAGNOSIS — F0390 Unspecified dementia without behavioral disturbance: Secondary | ICD-10-CM | POA: Diagnosis not present

## 2021-10-26 DIAGNOSIS — I517 Cardiomegaly: Secondary | ICD-10-CM | POA: Diagnosis not present

## 2021-10-26 DIAGNOSIS — R4182 Altered mental status, unspecified: Secondary | ICD-10-CM | POA: Diagnosis not present

## 2021-10-26 DIAGNOSIS — K449 Diaphragmatic hernia without obstruction or gangrene: Secondary | ICD-10-CM | POA: Diagnosis not present

## 2021-10-26 DIAGNOSIS — J189 Pneumonia, unspecified organism: Secondary | ICD-10-CM | POA: Diagnosis not present

## 2021-10-26 DIAGNOSIS — G9341 Metabolic encephalopathy: Secondary | ICD-10-CM | POA: Diagnosis not present

## 2021-10-27 DIAGNOSIS — J9610 Chronic respiratory failure, unspecified whether with hypoxia or hypercapnia: Secondary | ICD-10-CM | POA: Diagnosis not present

## 2021-10-27 DIAGNOSIS — J189 Pneumonia, unspecified organism: Secondary | ICD-10-CM | POA: Diagnosis not present

## 2021-10-27 DIAGNOSIS — G9341 Metabolic encephalopathy: Secondary | ICD-10-CM | POA: Diagnosis not present

## 2021-10-30 DIAGNOSIS — J189 Pneumonia, unspecified organism: Secondary | ICD-10-CM | POA: Diagnosis not present

## 2021-10-30 DIAGNOSIS — G9341 Metabolic encephalopathy: Secondary | ICD-10-CM | POA: Diagnosis not present

## 2021-10-30 DIAGNOSIS — J9611 Chronic respiratory failure with hypoxia: Secondary | ICD-10-CM | POA: Diagnosis not present

## 2021-10-30 DIAGNOSIS — N183 Chronic kidney disease, stage 3 unspecified: Secondary | ICD-10-CM | POA: Diagnosis not present

## 2021-10-31 DIAGNOSIS — F39 Unspecified mood [affective] disorder: Secondary | ICD-10-CM | POA: Diagnosis not present

## 2021-10-31 DIAGNOSIS — R451 Restlessness and agitation: Secondary | ICD-10-CM | POA: Diagnosis not present

## 2021-10-31 DIAGNOSIS — F03918 Unspecified dementia, unspecified severity, with other behavioral disturbance: Secondary | ICD-10-CM | POA: Diagnosis not present

## 2021-10-31 DIAGNOSIS — F419 Anxiety disorder, unspecified: Secondary | ICD-10-CM | POA: Diagnosis not present

## 2021-11-01 DIAGNOSIS — J449 Chronic obstructive pulmonary disease, unspecified: Secondary | ICD-10-CM | POA: Diagnosis not present

## 2021-11-01 DIAGNOSIS — M6281 Muscle weakness (generalized): Secondary | ICD-10-CM | POA: Diagnosis not present

## 2021-11-01 DIAGNOSIS — I5032 Chronic diastolic (congestive) heart failure: Secondary | ICD-10-CM | POA: Diagnosis not present

## 2021-11-01 DIAGNOSIS — R269 Unspecified abnormalities of gait and mobility: Secondary | ICD-10-CM | POA: Diagnosis not present

## 2021-11-10 DIAGNOSIS — N390 Urinary tract infection, site not specified: Secondary | ICD-10-CM | POA: Diagnosis not present

## 2021-12-13 DEATH — deceased

## 2023-01-18 IMAGING — DX DG CHEST 1V PORT
2 series · 2 of 2 positions shown · non-contrast
Comparison: Portable exam 1816 hours compared to 10/11/2021

CLINICAL DATA: Shortness of breath and cough, history coronary
artery disease post MI, hypertension, severe sepsis

EXAM:
PORTABLE CHEST 1 VIEW

[chest ap (1 of 2)]
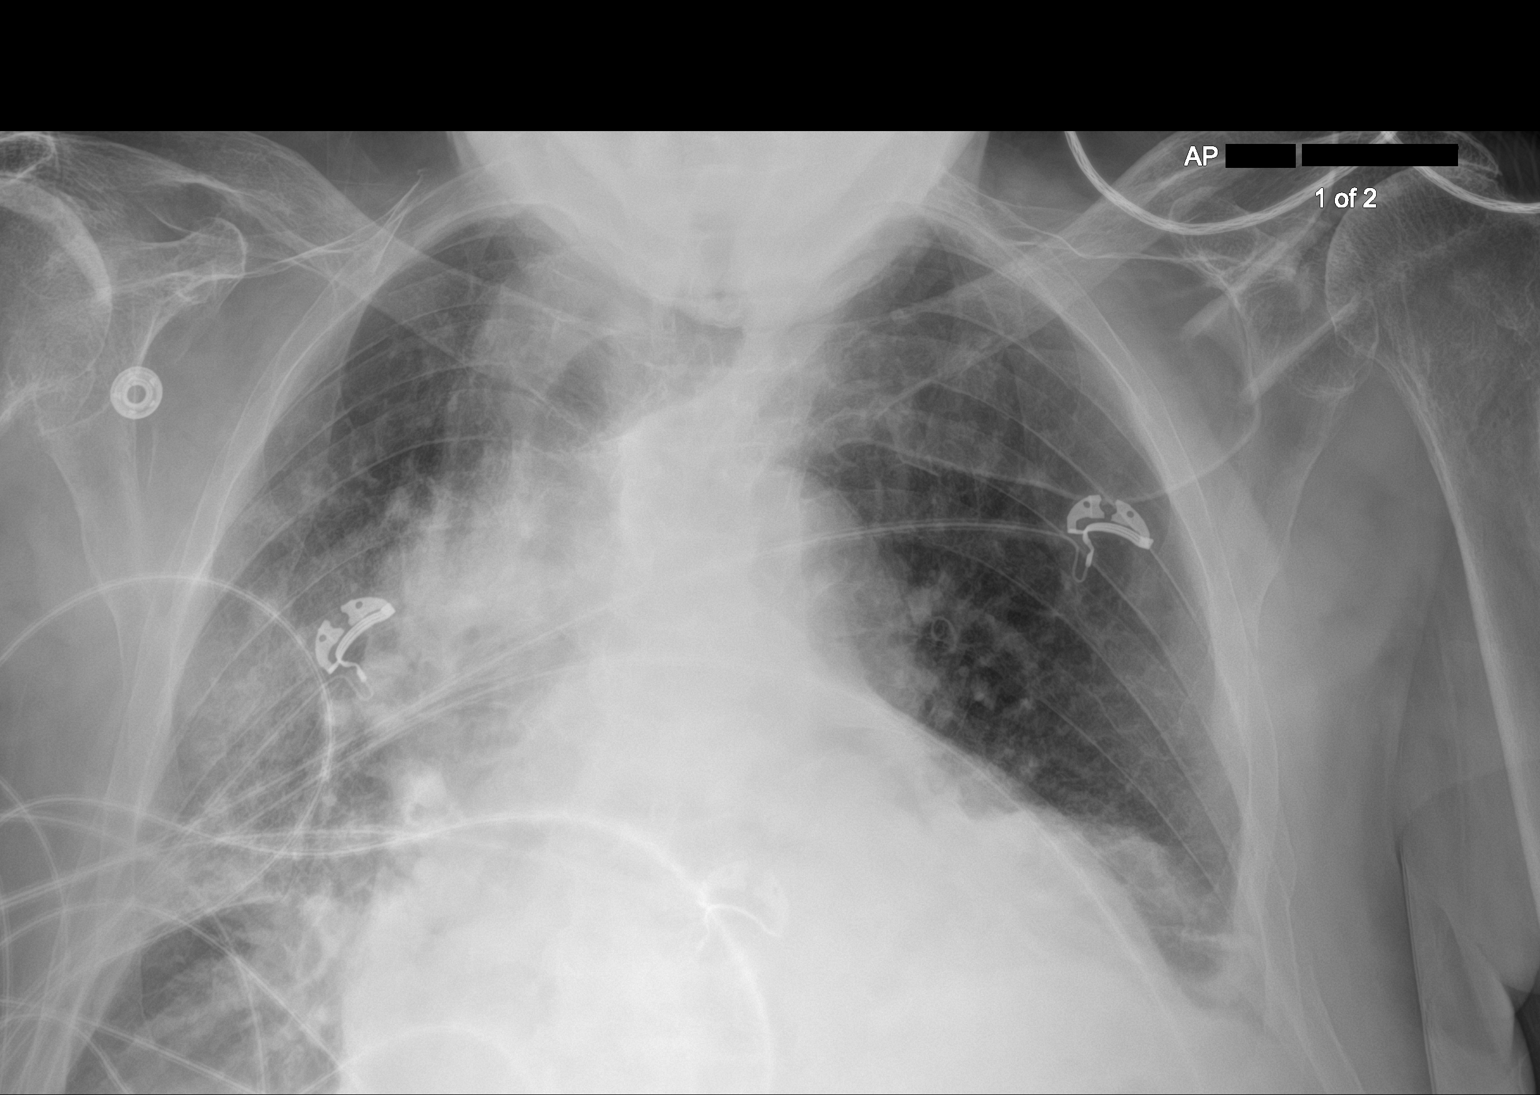

[chest ap (2 of 2)]
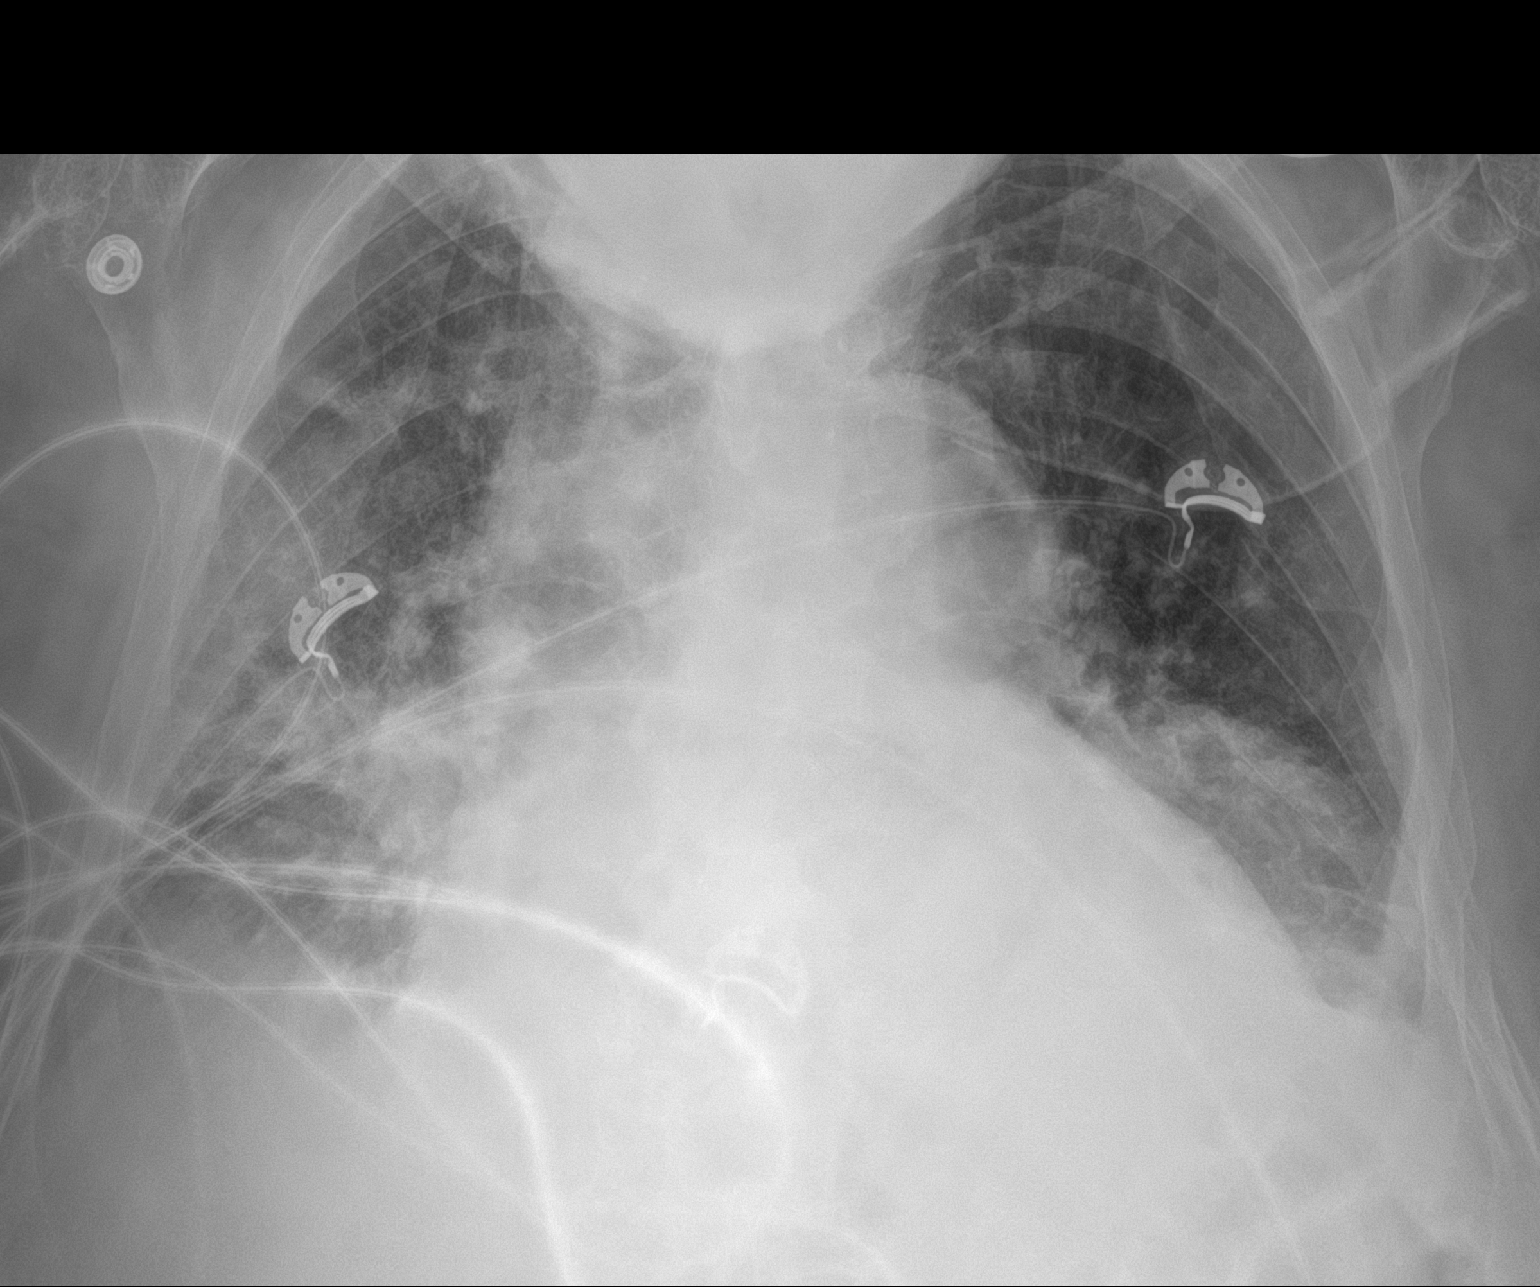

[2 of 2 positions shown; findings below may reference images not displayed]

FINDINGS: Enlargement of cardiac silhouette with vascular congestion.

Tortuous thoracic aorta.

BILATERAL patchy pulmonary infiltrates favoring multifocal
pneumonia.

Small LEFT pleural effusion.

No pneumothorax.

Bones demineralized.
IMPRESSION: Enlargement of cardiac silhouette with vascular congestion.

Patchy pulmonary infiltrates favoring multifocal pneumonia.

## 2023-01-19 IMAGING — DX DG CHEST 1V PORT
1 series · 1 of 1 positions shown · non-contrast
Comparison: Chest x-rays dated 10/13/2021 and 10/11/2021.

CLINICAL DATA: Respiratory failure, shortness of breath.

EXAM:
PORTABLE CHEST 1 VIEW

[chest ap]
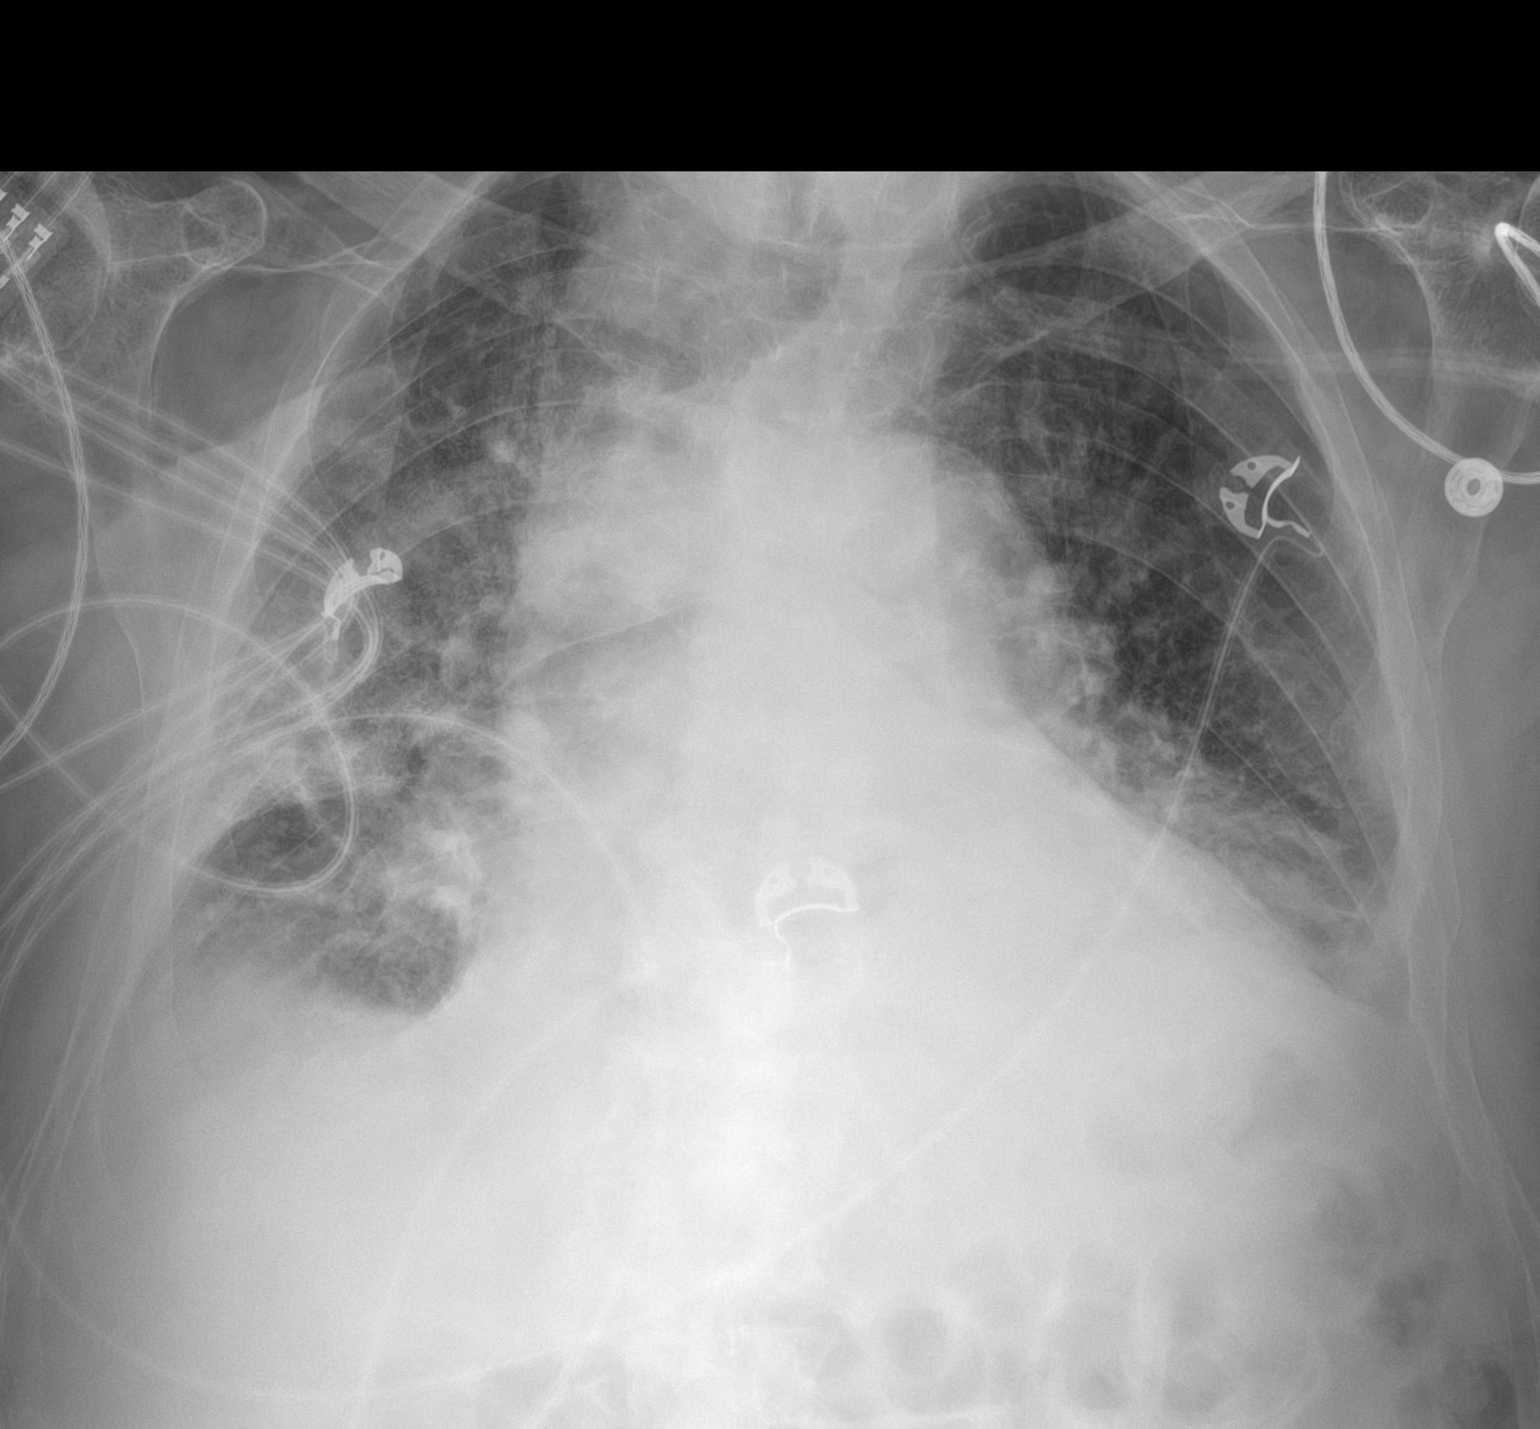

[1 of 1 positions shown; findings below may reference images not displayed]

FINDINGS: Stable cardiomegaly. Stable appearance of the patchy bilateral
airspace opacities and interstitial prominence. Stable appearance of
the probable bibasilar atelectasis and/or small pleural effusions.
IMPRESSION: Stable chest x-ray. Persistent patchy bilateral airspace opacities
and interstitial prominence suggesting multifocal pneumonia versus
pulmonary edema. Probable bibasilar atelectasis and/or small pleural
effusions.
